# Patient Record
Sex: Female | Born: 1935 | Race: White | Hispanic: No | Marital: Single | Smoking: Never smoker
Health system: Southern US, Community
[De-identification: ages and names within clinical notes are randomized; demographics above are authoritative.]

## PROBLEM LIST (undated history)

## (undated) DIAGNOSIS — I10 Essential (primary) hypertension: Secondary | ICD-10-CM

## (undated) DIAGNOSIS — R413 Other amnesia: Secondary | ICD-10-CM

## (undated) DIAGNOSIS — E785 Hyperlipidemia, unspecified: Secondary | ICD-10-CM

## (undated) DIAGNOSIS — K219 Gastro-esophageal reflux disease without esophagitis: Secondary | ICD-10-CM

## (undated) DIAGNOSIS — M810 Age-related osteoporosis without current pathological fracture: Secondary | ICD-10-CM

## (undated) HISTORY — PX: ABDOMINAL HYSTERECTOMY: SHX81

## (undated) HISTORY — DX: Age-related osteoporosis without current pathological fracture: M81.0

## (undated) HISTORY — DX: Other amnesia: R41.3

## (undated) HISTORY — DX: Essential (primary) hypertension: I10

## (undated) HISTORY — DX: Gastro-esophageal reflux disease without esophagitis: K21.9

## (undated) HISTORY — DX: Hyperlipidemia, unspecified: E78.5

## (undated) HISTORY — PX: EYE SURGERY: SHX253

---

## 1998-11-28 ENCOUNTER — Ambulatory Visit (HOSPITAL_COMMUNITY): Admission: RE | Admit: 1998-11-28 | Discharge: 1998-11-28 | Payer: Self-pay | Admitting: Gastroenterology

## 1999-01-19 ENCOUNTER — Ambulatory Visit (HOSPITAL_COMMUNITY): Admission: RE | Admit: 1999-01-19 | Discharge: 1999-01-19 | Payer: Self-pay | Admitting: Gastroenterology

## 1999-03-23 ENCOUNTER — Ambulatory Visit (HOSPITAL_COMMUNITY): Admission: RE | Admit: 1999-03-23 | Discharge: 1999-03-23 | Payer: Self-pay | Admitting: Oncology

## 1999-03-23 ENCOUNTER — Encounter: Payer: Self-pay | Admitting: Oncology

## 1999-06-01 ENCOUNTER — Ambulatory Visit (HOSPITAL_COMMUNITY): Admission: RE | Admit: 1999-06-01 | Discharge: 1999-06-01 | Payer: Self-pay | Admitting: Gastroenterology

## 1999-10-19 ENCOUNTER — Ambulatory Visit (HOSPITAL_COMMUNITY): Admission: RE | Admit: 1999-10-19 | Discharge: 1999-10-19 | Payer: Self-pay | Admitting: Internal Medicine

## 2000-12-25 ENCOUNTER — Other Ambulatory Visit: Admission: RE | Admit: 2000-12-25 | Discharge: 2000-12-25 | Payer: Self-pay | Admitting: Internal Medicine

## 2002-01-23 ENCOUNTER — Encounter: Payer: Self-pay | Admitting: Orthopedic Surgery

## 2002-01-29 ENCOUNTER — Inpatient Hospital Stay (HOSPITAL_COMMUNITY): Admission: RE | Admit: 2002-01-29 | Discharge: 2002-01-30 | Payer: Self-pay | Admitting: Orthopedic Surgery

## 2002-04-09 ENCOUNTER — Encounter: Admission: RE | Admit: 2002-04-09 | Discharge: 2002-05-27 | Payer: Self-pay | Admitting: Orthopedic Surgery

## 2011-05-24 ENCOUNTER — Encounter: Payer: Self-pay | Admitting: Internal Medicine

## 2011-05-24 NOTE — Telephone Encounter (Signed)
Error

## 2011-08-24 ENCOUNTER — Encounter: Payer: Self-pay | Admitting: Internal Medicine

## 2011-09-21 ENCOUNTER — Ambulatory Visit: Payer: Self-pay | Admitting: Internal Medicine

## 2013-11-11 ENCOUNTER — Ambulatory Visit (INDEPENDENT_AMBULATORY_CARE_PROVIDER_SITE_OTHER): Payer: Commercial Managed Care - HMO

## 2013-11-11 ENCOUNTER — Ambulatory Visit (INDEPENDENT_AMBULATORY_CARE_PROVIDER_SITE_OTHER): Payer: Commercial Managed Care - HMO | Admitting: Family Medicine

## 2013-11-11 ENCOUNTER — Encounter: Payer: Self-pay | Admitting: Family Medicine

## 2013-11-11 VITALS — BP 161/89 | HR 55 | Temp 97.6°F | Ht 62.5 in | Wt 164.0 lb

## 2013-11-11 DIAGNOSIS — M949 Disorder of cartilage, unspecified: Secondary | ICD-10-CM

## 2013-11-11 DIAGNOSIS — K219 Gastro-esophageal reflux disease without esophagitis: Secondary | ICD-10-CM | POA: Insufficient documentation

## 2013-11-11 DIAGNOSIS — R413 Other amnesia: Secondary | ICD-10-CM

## 2013-11-11 DIAGNOSIS — M8580 Other specified disorders of bone density and structure, unspecified site: Secondary | ICD-10-CM

## 2013-11-11 DIAGNOSIS — M899 Disorder of bone, unspecified: Secondary | ICD-10-CM

## 2013-11-11 DIAGNOSIS — Z23 Encounter for immunization: Secondary | ICD-10-CM

## 2013-11-11 DIAGNOSIS — Z780 Asymptomatic menopausal state: Secondary | ICD-10-CM

## 2013-11-11 DIAGNOSIS — I10 Essential (primary) hypertension: Secondary | ICD-10-CM

## 2013-11-11 DIAGNOSIS — E785 Hyperlipidemia, unspecified: Secondary | ICD-10-CM | POA: Insufficient documentation

## 2013-11-11 LAB — POCT CBC
Granulocyte percent: 52.3 %G (ref 37–80)
HCT, POC: 45 % (ref 37.7–47.9)
Hemoglobin: 14.4 g/dL (ref 12.2–16.2)
Lymph, poc: 2.7 (ref 0.6–3.4)
MCH, POC: 30.6 pg (ref 27–31.2)
MCHC: 32.1 g/dL (ref 31.8–35.4)
MCV: 95.5 fL (ref 80–97)
MPV: 7.6 fL (ref 0–99.8)
POC Granulocyte: 3.4 (ref 2–6.9)
POC LYMPH PERCENT: 42 %L (ref 10–50)
Platelet Count, POC: 287 10*3/uL (ref 142–424)
RBC: 4.7 M/uL (ref 4.04–5.48)
RDW, POC: 13.8 %
WBC: 6.5 10*3/uL (ref 4.6–10.2)

## 2013-11-11 NOTE — Patient Instructions (Addendum)
Continue current medications. Continue good therapeutic lifestyle changes which include good diet and exercise. Fall precautions discussed with patient. If an FOBT was given today- please return it to our front desk. If you are over 78 years old - you may need Prevnar 13 or the adult Pneumonia vaccine.  We will increase her Aricept to 10 mg The facility will call back in 4 weeks and we will start Namenda and increase this gradually over several weeks We will see her back in about 5 months We will send a copy of the lab results to the facility where she is staying she will get a chest x-ray today and we will call in with the results of that to the facility and she will return an FOBT. She will also receive a Prevnar vaccine today  please see and facility blood pressure readings for review in 4 weeks. Check blood pressures at least once weekly

## 2013-11-11 NOTE — Progress Notes (Signed)
Subjective:    Patient ID: Vickie Hall, female    DOB: [birth year], 80 y.o.   MRN: [id number]  HPI Pt here for follow up and management of chronic medical problems. The patient is currently a resident at [facility]. Her biggest problem is her memory deficit. She isn't aware that one of the care providers from this facility. She is past due on many things in her health maintenance and these will be arranged through the assisted living facility. She will be given a Prevnar vaccine today. She is pleasant and seemed to have a good grip on her memory today.         There are no active problems to display for this patient.  Outpatient Encounter Prescriptions as of 11/11/2013  Medication Sig  . donepezil (ARICEPT) 5 MG tablet Take 5 mg by mouth at bedtime.  Marland Kitchen ezetimibe (ZETIA) 10 MG tablet Take 10 mg by mouth daily.  . valsartan (DIOVAN) 160 MG tablet Take 160 mg by mouth daily.    Review of Systems  Constitutional: Negative.   HENT: Negative.   Eyes: Negative.   Respiratory: Negative.   Cardiovascular: Negative.   Gastrointestinal: Negative.   Endocrine: Negative.   Genitourinary: Negative.   Musculoskeletal: Negative.   Skin: Negative.   Allergic/Immunologic: Negative.   Neurological: Negative.   Hematological: Negative.   Psychiatric/Behavioral: Negative.        Objective:   Physical Exam  Nursing note and vitals reviewed. Constitutional: She is oriented to person, place, and time. She appears well-developed and well-nourished. No distress.  Pleasant and cooperative  HENT:  Head: Normocephalic and atraumatic.  Right Ear: External ear normal.  Left Ear: External ear normal.  Nose: Nose normal.  Mouth/Throat: Oropharynx is clear and moist.  Eyes: Conjunctivae and EOM are normal. Pupils are equal, round, and reactive to light. Right eye exhibits no discharge. Left eye exhibits no discharge. No scleral icterus.  Neck: Normal range of motion. Neck  supple. No thyromegaly present.  Cardiovascular: Normal rate, regular rhythm, normal heart sounds and intact distal pulses.  Exam reveals no gallop and no friction rub.   No murmur heard. Regular rate and rhythm at 72 per minute  Pulmonary/Chest: Effort normal and breath sounds normal. No respiratory distress. She has no wheezes. She has no rales. She exhibits no tenderness.  Abdominal: Soft. Bowel sounds are normal. She exhibits no mass. There is no tenderness. There is no rebound and no guarding.  Genitourinary:  Breast exam was done today and this was within normal limits without any axillary adenopathy or breast masses  Musculoskeletal: Normal range of motion. She exhibits no edema and no tenderness.  Lymphadenopathy:    She has no cervical adenopathy.  Neurological: She is alert and oriented to person, place, and time. She has normal reflexes. No cranial nerve deficit.  Skin: Skin is warm and dry. No rash noted.  Psychiatric: She has a normal mood and affect. Her behavior is normal.   BP 161/89  Pulse 55  Temp(Src) 97.6 F (36.4 C) (Oral)  Ht 5' 2.5" (1.588 m)  Wt 164 lb (74.39 kg)  BMI 29.50 kg/m2  [facility] reading (PRIMARY) by  Dr. Brunilda Payor x-ray -within normal limit, no acute cardiopulmonary changes                                        Assessment & Plan:  1. GERD (gastroesophageal reflux disease) - POCT CBC  2. HTN (hypertension) - POCT CBC - BMP8+EGFR - Hepatic function panel - DG Chest 2 View; Future  3. Hyperlipidemia - POCT CBC - NMR, lipoprofile  4. Memory deficit -Will increase Aricept today - POCT CBC  5. Osteopenia - POCT CBC - Vit D  25 hydroxy (rtn osteoporosis monitoring) - DG Bone Density; Future  6. Postmenopausal - DG Bone Density; Future   Patient Instructions  Continue current medications. Continue good therapeutic lifestyle changes which include good diet and exercise. Fall precautions discussed with patient. If an FOBT was given  today- please return it to our front desk. If you are over 50 years old - you may need Prevnar 13 or the adult Pneumonia vaccine.  We will increase her Aricept to 10 mg The facility will call back in 4 weeks and we will start Namenda and increase this gradually over several weeks We will see her back in about 5 months We will send a copy of the lab results to the facility where she is staying she will get a chest x-ray today and we will call in with the results of that to the facility and she will return an FOBT. She will also receive a Prevnar vaccine today  please see and facility blood pressure readings for review in 4 weeks. Check blood pressures at least once weekly   Don W. Moore MD  

## 2013-11-13 LAB — NMR, LIPOPROFILE
Cholesterol: 207 mg/dL — ABNORMAL HIGH (ref <200)
HDL Cholesterol by NMR: 41 mg/dL (ref >=40)
HDL Particle Number: 31.3 umol/L (ref >=30.5)
LDL PARTICLE NUMBER: 1933 nmol/L — AB (ref <1000)
LDL SIZE: 20.4 nm — AB (ref >20.5)
LDLC SERPL CALC-MCNC: 121 mg/dL — ABNORMAL HIGH (ref <100)
LP-IR SCORE: 60 — AB (ref <=45)
Small LDL Particle Number: 1270 nmol/L — ABNORMAL HIGH (ref <=527)
Triglycerides by NMR: 224 mg/dL — ABNORMAL HIGH (ref <150)

## 2013-11-13 LAB — BMP8+EGFR
BUN/Creatinine Ratio: 19 (ref 11–26)
BUN: 15 mg/dL (ref 8–27)
CO2: 25 mmol/L (ref 18–29)
Calcium: 9.6 mg/dL (ref 8.7–10.3)
Chloride: 101 mmol/L (ref 97–108)
Creatinine, Ser: 0.79 mg/dL (ref 0.57–1.00)
GFR calc Af Amer: 80 mL/min/{1.73_m2} (ref >59)
GFR calc non Af Amer: 69 mL/min/{1.73_m2} (ref >59)
Glucose: 112 mg/dL — ABNORMAL HIGH (ref 65–99)
Potassium: 3.9 mmol/L (ref 3.5–5.2)
Sodium: 141 mmol/L (ref 134–144)

## 2013-11-13 LAB — HEPATIC FUNCTION PANEL
ALT: 19 IU/L (ref 0–32)
AST: 15 IU/L (ref 0–40)
Albumin: 4.7 g/dL (ref 3.5–4.7)
Alkaline Phosphatase: 89 IU/L (ref 39–117)
Bilirubin, Direct: 0.1 mg/dL (ref 0.00–0.40)
Total Bilirubin: 0.3 mg/dL (ref 0.0–1.2)
Total Protein: 7.2 g/dL (ref 6.0–8.5)

## 2013-11-13 LAB — VITAMIN D 25 HYDROXY (VIT D DEFICIENCY, FRACTURES): Vit D, 25-Hydroxy: 21.3 ng/mL — ABNORMAL LOW (ref 30.0–100.0)

## 2013-11-16 NOTE — Addendum Note (Signed)
Addended by: Magdalene RiverBULLINS, Ladesha Pacini H on: 11/16/2013 11:33 AM   Modules accepted: Orders

## 2013-11-30 ENCOUNTER — Encounter: Payer: Self-pay | Admitting: *Deleted

## 2013-12-02 ENCOUNTER — Telehealth: Payer: Self-pay | Admitting: Pharmacist

## 2013-12-02 NOTE — Telephone Encounter (Signed)
Please let patient know OK to discuss both conditions at appt April 1st

## 2013-12-14 ENCOUNTER — Ambulatory Visit: Payer: Commercial Managed Care - HMO

## 2013-12-16 ENCOUNTER — Ambulatory Visit (INDEPENDENT_AMBULATORY_CARE_PROVIDER_SITE_OTHER): Payer: Commercial Managed Care - HMO

## 2013-12-16 ENCOUNTER — Encounter: Payer: Self-pay | Admitting: Pharmacist

## 2013-12-16 ENCOUNTER — Ambulatory Visit (INDEPENDENT_AMBULATORY_CARE_PROVIDER_SITE_OTHER): Payer: Commercial Managed Care - HMO | Admitting: Pharmacist

## 2013-12-16 VITALS — BP 155/80 | HR 60 | Ht 62.0 in | Wt 163.5 lb

## 2013-12-16 DIAGNOSIS — Z780 Asymptomatic menopausal state: Secondary | ICD-10-CM

## 2013-12-16 DIAGNOSIS — R413 Other amnesia: Secondary | ICD-10-CM

## 2013-12-16 DIAGNOSIS — M949 Disorder of cartilage, unspecified: Secondary | ICD-10-CM

## 2013-12-16 DIAGNOSIS — M8580 Other specified disorders of bone density and structure, unspecified site: Secondary | ICD-10-CM

## 2013-12-16 DIAGNOSIS — M899 Disorder of bone, unspecified: Secondary | ICD-10-CM

## 2013-12-16 DIAGNOSIS — M810 Age-related osteoporosis without current pathological fracture: Secondary | ICD-10-CM

## 2013-12-16 MED ORDER — MEMANTINE HCL ER 28 MG PO CP24: 1.0000 | ORAL_CAPSULE | Freq: Every day | ORAL | Status: DC

## 2013-12-16 MED ORDER — MEMANTINE HCL ER 7 & 14 & 21 &28 MG PO CP24: ORAL_CAPSULE | ORAL | Status: DC

## 2013-12-16 NOTE — Progress Notes (Signed)
Patient ID: Lamont SnowballHelen M Eugene, female   DOB: 10/26/1927, 80 y.o.   MRN: [id number] [facility] Current Height: Height: 5\' 2"  (157.5 cm)      Max Lifetime Height:  5\' 3"  Current Weight: Weight: 163 lb 8 oz (74.163 kg)       Ethnicity:Caucasian  BP: BP: 155/80 mmHg     HR:  Pulse Rate: 60      HPI: Does pt already have a diagnosis of:  Osteopenia?  Yes Osteoporosis?  No  Back Pain?  No       Kyphosis?  No Prior fracture?  No Med(s) for Osteoporosis/Osteopenia:  none Med(s) previously tried for Osteoporosis/Osteopenia:  evista - just didn't continue, fosamax - non compilant, Actonel                                                             PMH: Age at menopause:  80 yo Hysterectomy?  Yes Oophorectomy?  UTD HRT? Yes - Former.  Type/duration: less than 6 months Steroid Use?  No Thyroid med?  No History of cancer?  No History of digestive disorders (ie Crohn's)?  No Current or previous eating disorders?  No Last Vitamin D Result:  21.3 (11/11/2013) Last GFR Result:  69 (11/11/2013)   FH/SH: Family history of osteoporosis?  No Parent with history of hip fracture?  No Family history of breast cancer?  No Exercise?  Yes - but very little Smoking?  No Alcohol?  No    Calcium Assessment Calcium Intake  # of servings/day  Calcium mg  Milk (8 oz) 1  x  300  = 300mg   Yogurt (4 oz) 0 x  200 = 0  Cheese (1 oz) 1 x  200 = 200mg   Other Calcium sources   250mg   Ca supplement 0 = 0   Estimated calcium intake per day 750mg     DEXA Results Date of Test T-Score for AP Spine L1-L4 T-Score for Total Left Hip T-Score for Total Right Hip  12/16/2013 -0.2 -2.2 -2.3  06/20/2011 -0.3 -1.8 -1.5  10/20/2007 0.4 -1.5 -1.2  ** T-Score for neck of left hip 12/16/2013 -2.5     Assessment: Osteoporosis with continued decreased BMD Memory deficit    Recommendations: 1.  Start  alendronate (FOSAMAX)  2.  recommend calcium 1200mg  daily through supplementation or diet.  3.  recommend  weight bearing exercise - 30 minutes at least 4 days per week.   4.  Counseled and educated about fall risk and prevention. 5.  Pre Dr Kathi DerMoore's last note - started Namenda XR 7mg  for 7 days, then 14mg  for 7 days, 21 mg for 7 days then 28mg  daily thereafter.  Recheck DEXA:  2 years  Time spent counseling patient:  30 minutes   Henrene Pastorammy Kenroy Timberman, PharmD, CPP

## 2014-03-01 ENCOUNTER — Encounter: Payer: Self-pay | Admitting: Family Medicine

## 2014-03-01 ENCOUNTER — Ambulatory Visit (INDEPENDENT_AMBULATORY_CARE_PROVIDER_SITE_OTHER): Payer: Commercial Managed Care - HMO | Admitting: Family Medicine

## 2014-03-01 VITALS — BP 162/78 | HR 54 | Temp 97.6°F | Ht 62.0 in | Wt 161.0 lb

## 2014-03-01 DIAGNOSIS — R413 Other amnesia: Secondary | ICD-10-CM

## 2014-03-01 DIAGNOSIS — E559 Vitamin D deficiency, unspecified: Secondary | ICD-10-CM

## 2014-03-01 DIAGNOSIS — H6121 Impacted cerumen, right ear: Secondary | ICD-10-CM

## 2014-03-01 DIAGNOSIS — H6120 Impacted cerumen, unspecified ear: Secondary | ICD-10-CM

## 2014-03-01 DIAGNOSIS — E785 Hyperlipidemia, unspecified: Secondary | ICD-10-CM

## 2014-03-01 DIAGNOSIS — I10 Essential (primary) hypertension: Secondary | ICD-10-CM

## 2014-03-01 DIAGNOSIS — K219 Gastro-esophageal reflux disease without esophagitis: Secondary | ICD-10-CM

## 2014-03-01 LAB — POCT CBC
GRANULOCYTE PERCENT: 54 % (ref 37–80)
HEMATOCRIT: 45.7 % (ref 37.7–47.9)
HEMOGLOBIN: 14.1 g/dL (ref 12.2–16.2)
Lymph, poc: 2.9 (ref 0.6–3.4)
MCH, POC: 29.5 pg (ref 27–31.2)
MCHC: 30.9 g/dL — AB (ref 31.8–35.4)
MCV: 95.4 fL (ref 80–97)
MPV: 7.9 fL (ref 0–99.8)
POC GRANULOCYTE: 3.9 (ref 2–6.9)
POC LYMPH PERCENT: 39.6 %L (ref 10–50)
Platelet Count, POC: 256 10*3/uL (ref 142–424)
RBC: 4.8 M/uL (ref 4.04–5.48)
RDW, POC: 14 %
WBC: 7.3 10*3/uL (ref 4.6–10.2)

## 2014-03-01 NOTE — Patient Instructions (Addendum)
Medicare Annual Wellness Visit  [employer] and the medical providers at [facility] strive to bring you the best medical care.  In doing so we not only want to address your current medical conditions and concerns but also to detect new conditions early and prevent illness, disease and health-related problems.    Medicare offers a yearly Wellness Visit which allows our clinical staff to assess your need for preventative services including immunizations, lifestyle education, counseling to decrease risk of preventable diseases and screening for fall risk and other medical concerns.    This visit is provided free of charge (no copay) for all Medicare recipients. The clinical pharmacists at [facility] have begun to conduct these Wellness Visits which will also include a thorough review of all your medications.    As you primary medical provider recommend that you make an appointment for your Annual Wellness Visit if you have not done so already this year.  You may set up this appointment before you leave today or you may call back [phone] and schedule an appointment.  Please make sure when you call that you mention that you are scheduling your Annual Wellness Visit with the clinical pharmacist so that the appointment may be made for the proper length of time.      Continue current medications. Continue good therapeutic lifestyle changes which include good diet and exercise. Fall precautions discussed with patient. If an FOBT was given today- please return it to our front desk. If you are over 78 years old - you may need Prevnar 13 or the adult Pneumonia vaccine.  You may use Debrox eardrops 2-3 drops nightly to the right ear canal for 3 nights and and wait 1 week and repeat, this will help soften the ear cerumen in the ear canal Return blood pressures for review in 2 weeks

## 2014-03-01 NOTE — Progress Notes (Signed)
Subjective:    Patient ID: Vickie Hall, female    DOB: 30-May-1928, 80 y.o.   MRN: [id number]  HPI Pt here for follow up and management of chronic medical problems. The patient comes to the visit today, [facility]. She is to get lab work today issues also due to return in FOBT. The patient as usual has no complaints. She is in bed at [facility].       Patient Active Problem List   Diagnosis Date Noted  . Osteoporosis, senile 12/16/2013  . GERD (gastroesophageal reflux disease) 11/11/2013  . HTN (hypertension) 11/11/2013  . Hyperlipidemia 11/11/2013  . Memory deficit 11/11/2013   Outpatient Encounter Prescriptions as of 03/01/2014  Medication Sig  . alendronate (FOSAMAX) 70 MG tablet Take 70 mg by mouth once a week. Take with a full glass of water on an empty stomach.  . donepezil (ARICEPT) 10 MG tablet Take 10 mg by mouth at bedtime.  Marland Kitchen ezetimibe (ZETIA) 10 MG tablet Take 10 mg by mouth daily.  . Memantine HCl ER (NAMENDA XR TITRATION PACK) 7 & 14 & 21 &28 MG CP24 Take per package directiond  . valsartan (DIOVAN) 160 MG tablet Take 160 mg by mouth daily.  . [DISCONTINUED] Memantine HCl ER 28 MG CP24 Take 28 mg by mouth daily.    Review of Systems  Constitutional: Negative.   HENT: Negative.   Eyes: Negative.   Respiratory: Negative.   Cardiovascular: Negative.   Gastrointestinal: Negative.   Endocrine: Negative.   Genitourinary: Negative.   Musculoskeletal: Negative.   Skin: Negative.   Allergic/Immunologic: Negative.   Neurological: Negative.   Hematological: Negative.   Psychiatric/Behavioral: Negative.        Objective:   Physical Exam  Nursing note and vitals reviewed. Constitutional: She appears well-developed and well-nourished. No distress.  HENT:  Head: Normocephalic and atraumatic.  Left Ear: External ear normal.  Nose: Nose normal.  Mouth/Throat: Oropharynx is clear and moist. No  oropharyngeal exudate.  Ear cerumen  Eyes: Conjunctivae and EOM are normal. Pupils are equal, round, and reactive to light. Right eye exhibits no discharge. Left eye exhibits no discharge. No scleral icterus.  Neck: Normal range of motion. Neck supple. No thyromegaly present.  Cardiovascular: Normal rate, regular rhythm, normal heart sounds and intact distal pulses.  Exam reveals no gallop and no friction rub.   No murmur heard. At 60 per minute  Pulmonary/Chest: Effort normal and breath sounds normal. No respiratory distress. She has no wheezes. She has no rales. She exhibits no tenderness.  Abdominal: Soft. Bowel sounds are normal. She exhibits no mass. There is no tenderness. There is no rebound and no guarding.  Musculoskeletal: Normal range of motion. She exhibits no edema and no tenderness.  Lymphadenopathy:    She has no cervical adenopathy.  Neurological: She is alert. She has normal reflexes. No cranial nerve deficit.  Skin: Skin is warm and dry. No rash noted.  Psychiatric: She has a normal mood and affect. Her behavior is normal. Thought content normal.  Patient's memory is definitely diminished. She did not recall that her brothers had passed away   BP 162/78  Pulse 54  Temp(Src) 97.6 F (36.4 C) (Oral)  Ht '5\' 2"'  (1.575 m)  Wt 161 lb (73.029 kg)  BMI 29.44 kg/m2        Assessment & Plan:  1. GERD (gastroesophageal reflux disease) - POCT CBC  2. HTN (hypertension) -  POCT CBC - BMP8+EGFR - Hepatic function panel  3. Hyperlipidemia - POCT CBC - Lipid panel  4. Vitamin D deficiency - Vit D  25 hydroxy (rtn osteoporosis monitoring)  5. Memory impairment  6. Right ear impacted cerumen Patient Instructions                       Medicare Annual Wellness Visit  [place] and the medical providers at [facility] strive to bring you the best medical care.  In doing so we not only want to address your current medical conditions and concerns  but also to detect new conditions early and prevent illness, disease and health-related problems.    Medicare offers a yearly Wellness Visit which allows our clinical staff to assess your need for preventative services including immunizations, lifestyle education, counseling to decrease risk of preventable diseases and screening for fall risk and other medical concerns.    This visit is provided free of charge (no copay) for all Medicare recipients. The clinical pharmacists at [facility] have begun to conduct these Wellness Visits which will also include a thorough review of all your medications.    As you primary medical provider recommend that you make an appointment for your Annual Wellness Visit if you have not done so already this year.  You may set up this appointment before you leave today or you may call back [phone]) and schedule an appointment.  Please make sure when you call that you mention that you are scheduling your Annual Wellness Visit with the clinical pharmacist so that the appointment may be made for the proper length of time.      Continue current medications. Continue good therapeutic lifestyle changes which include good diet and exercise. Fall precautions discussed with patient. If an FOBT was given today- please return it to our front desk. If you are over 68 years old - you may need Prevnar 59 or the adult Pneumonia vaccine.  You may use Debrox eardrops 2-3 drops nightly to the right ear canal for 3 nights and and wait 1 week and repeat, this will help soften the ear cerumen in the ear canal Return blood pressures for review in 2 weeks    Arrie Senate MD

## 2014-03-02 LAB — BMP8+EGFR
BUN/Creatinine Ratio: 15 (ref 11–26)
BUN: 13 mg/dL (ref 8–27)
CO2: 24 mmol/L (ref 18–29)
Calcium: 9.4 mg/dL (ref 8.7–10.3)
Chloride: 99 mmol/L (ref 97–108)
Creatinine, Ser: 0.84 mg/dL (ref 0.57–1.00)
GFR, EST AFRICAN AMERICAN: 74 mL/min/{1.73_m2} (ref >59)
GFR, EST NON AFRICAN AMERICAN: 64 mL/min/{1.73_m2} (ref >59)
Glucose: 120 mg/dL — ABNORMAL HIGH (ref 65–99)
Potassium: 4.9 mmol/L (ref 3.5–5.2)
Sodium: 140 mmol/L (ref 134–144)

## 2014-03-02 LAB — HEPATIC FUNCTION PANEL
ALBUMIN: 4.5 g/dL (ref 3.5–4.7)
ALK PHOS: 87 IU/L (ref 39–117)
ALT: 16 IU/L (ref 0–32)
AST: 23 IU/L (ref 0–40)
BILIRUBIN DIRECT: 0.1 mg/dL (ref 0.00–0.40)
Total Bilirubin: 0.3 mg/dL (ref 0.0–1.2)
Total Protein: 7.1 g/dL (ref 6.0–8.5)

## 2014-03-02 LAB — LIPID PANEL
Chol/HDL Ratio: 4.7 ratio units — ABNORMAL HIGH (ref 0.0–4.4)
Cholesterol, Total: 223 mg/dL — ABNORMAL HIGH (ref 100–199)
HDL: 47 mg/dL (ref >39)
LDL CALC: 143 mg/dL — AB (ref 0–99)
TRIGLYCERIDES: 167 mg/dL — AB (ref 0–149)
VLDL Cholesterol Cal: 33 mg/dL (ref 5–40)

## 2014-03-02 LAB — VITAMIN D 25 HYDROXY (VIT D DEFICIENCY, FRACTURES): Vit D, 25-Hydroxy: 17.7 ng/mL — ABNORMAL LOW (ref 30.0–100.0)

## 2014-03-08 ENCOUNTER — Ambulatory Visit: Payer: Commercial Managed Care - HMO | Admitting: Family Medicine

## 2014-07-07 ENCOUNTER — Encounter: Payer: Self-pay | Admitting: Family Medicine

## 2014-07-07 ENCOUNTER — Ambulatory Visit (INDEPENDENT_AMBULATORY_CARE_PROVIDER_SITE_OTHER): Payer: Commercial Managed Care - HMO | Admitting: Family Medicine

## 2014-07-07 VITALS — BP 154/83 | HR 50 | Temp 97.0°F | Ht 62.0 in | Wt 155.0 lb

## 2014-07-07 DIAGNOSIS — F0390 Unspecified dementia without behavioral disturbance: Secondary | ICD-10-CM

## 2014-07-07 DIAGNOSIS — Z23 Encounter for immunization: Secondary | ICD-10-CM

## 2014-07-07 DIAGNOSIS — I10 Essential (primary) hypertension: Secondary | ICD-10-CM

## 2014-07-07 DIAGNOSIS — K219 Gastro-esophageal reflux disease without esophagitis: Secondary | ICD-10-CM

## 2014-07-07 DIAGNOSIS — E559 Vitamin D deficiency, unspecified: Secondary | ICD-10-CM

## 2014-07-07 DIAGNOSIS — R413 Other amnesia: Secondary | ICD-10-CM

## 2014-07-07 DIAGNOSIS — E785 Hyperlipidemia, unspecified: Secondary | ICD-10-CM

## 2014-07-07 LAB — POCT CBC
Granulocyte percent: 57.9 %G (ref 37–80)
HCT, POC: 44.7 % (ref 37.7–47.9)
Hemoglobin: 14.4 g/dL (ref 12.2–16.2)
LYMPH, POC: 2.8 (ref 0.6–3.4)
MCH: 31 pg (ref 27–31.2)
MCHC: 32.2 g/dL (ref 31.8–35.4)
MCV: 96.1 fL (ref 80–97)
MPV: 7.7 fL (ref 0–99.8)
PLATELET COUNT, POC: 245 10*3/uL (ref 142–424)
POC Granulocyte: 4.5 (ref 2–6.9)
POC LYMPH PERCENT: 37 %L (ref 10–50)
RBC: 4.7 M/uL (ref 4.04–5.48)
RDW, POC: 13.6 %
WBC: 7.7 10*3/uL (ref 4.6–10.2)

## 2014-07-07 MED ORDER — MEMANTINE HCL-DONEPEZIL HCL ER 28-10 MG PO CP24: 1.0000 | ORAL_CAPSULE | Freq: Every day | ORAL | Status: DC

## 2014-07-07 NOTE — Progress Notes (Signed)
Subjective:    Patient ID: Vickie Hall, female    DOB: 03-Jan-1928, 80 y.o.   MRN: [id number]  HPI Pt here for follow up and management of chronic medical problems. The patient tends to the visit today from [facility]. She comes with one of the caregivers there. Patient usual her main problem is her memory impairment and as usual she has no complaints. The patient has been on Namenda and Aricept and we will change her to namzaric 10/28 one daily. The patient is able to carry on conversation and still wants to go from. Her memory is definitely impaired regarding her family and her ability to be able to take care of herself.       Patient Active Problem List   Diagnosis Date Noted  . Osteoporosis, senile 12/16/2013  . GERD (gastroesophageal reflux disease) 11/11/2013  . HTN (hypertension) 11/11/2013  . Hyperlipidemia 11/11/2013  . Memory deficit 11/11/2013   Outpatient Encounter Prescriptions as of 07/07/2014  Medication Sig  . alendronate (FOSAMAX) 70 MG tablet Take 70 mg by mouth once a week. Take with a full glass of water on an empty stomach.  . donepezil (ARICEPT) 10 MG tablet Take 10 mg by mouth at bedtime.  Marland Kitchen ezetimibe (ZETIA) 10 MG tablet Take 10 mg by mouth daily.  . Memantine HCl ER (NAMENDA XR TITRATION PACK) 7 & 14 & 21 &28 MG CP24 Take per package directiond  . valsartan (DIOVAN) 160 MG tablet Take 160 mg by mouth daily.    Review of Systems  Constitutional: Negative.   HENT: Negative.   Eyes: Negative.   Respiratory: Negative.   Cardiovascular: Negative.   Gastrointestinal: Negative.   Endocrine: Negative.   Genitourinary: Negative.   Musculoskeletal: Negative.   Skin: Negative.   Allergic/Immunologic: Negative.   Neurological: Negative.   Hematological: Negative.   Psychiatric/Behavioral: Negative.        Objective:   Physical Exam  Nursing note and vitals reviewed. Constitutional: She is oriented to person, place, and time. She  appears well-developed and well-nourished. No distress.  HENT:  Head: Normocephalic and atraumatic.  Left Ear: External ear normal.  Mouth/Throat: Oropharynx is clear and moist.  There is impacted ear cerumen in the right ear canal . There is nasal congestion bilateral  Eyes: Conjunctivae and EOM are normal. Pupils are equal, round, and reactive to light. Right eye exhibits no discharge. Left eye exhibits no discharge. No scleral icterus.  Neck: Normal range of motion. Neck supple. No thyromegaly present.  No carotid bruits were auscultated  Cardiovascular: Normal rate, regular rhythm, normal heart sounds and intact distal pulses.   No murmur heard. At 60 per minute  Pulmonary/Chest: Effort normal and breath sounds normal. No respiratory distress. She has no wheezes. She has no rales. She exhibits no tenderness.  Abdominal: Soft. Bowel sounds are normal. She exhibits no mass. There is no tenderness. There is no rebound and no guarding.  Musculoskeletal: Normal range of motion. She exhibits no edema and no tenderness.  Lymphadenopathy:    She has no cervical adenopathy.  Neurological: She is alert and oriented to person, place, and time. She has normal reflexes. No cranial nerve deficit.  Skin: Skin is warm and dry. No rash noted.  Psychiatric: She has a normal mood and affect. Her behavior is normal.  The patient thinks that she is still able to go home and stay by herself. She is able to respond appropriately to questions but the ability to  remember specific details or gone. She is dressed appropriately and seems to be pleasant and calm and a difficult situation and time in her life.   BP 154/83  Pulse 50  Temp(Src) 97 F (36.1 C) (Oral)  Ht _0  (1.575 m)  Wt 155 lb (70.308 kg)  BMI 28.34 kg/m2  Right ear canal will be irrigated to remove cerumen from the ear canal.      Assessment & Plan:  1. Gastroesophageal reflux disease, esophagitis presence not specified - POCT CBC  2.  Essential hypertension - POCT CBC - BMP8+EGFR - Hepatic function panel  3. Hyperlipidemia - POCT CBC - Lipid panel  4. Memory deficit - POCT CBC  5. Vitamin D deficiency - Vit D  25 hydroxy (rtn osteoporosis monitoring)  6. Dementia, without behavioral disturbance  Meds ordered this encounter  Medications  . Memantine HCl-Donepezil HCl (NAMZARIC) 28-10 MG CP24    Sig: Take 1 tablet by mouth at bedtime.    Dispense:  30 capsule    Refill:  5   Patient Instructions                       Medicare Annual Wellness Visit  [place] and the medical providers at [facility] strive to bring you the best medical care.  In doing so we not only want to address your current medical conditions and concerns but also to detect new conditions early and prevent illness, disease and health-related problems.    Medicare offers a yearly Wellness Visit which allows our clinical staff to assess your need for preventative services including immunizations, lifestyle education, counseling to decrease risk of preventable diseases and screening for fall risk and other medical concerns.    This visit is provided free of charge (no copay) for all Medicare recipients. The clinical pharmacists at [facility] have begun to conduct these Wellness Visits which will also include a thorough review of all your medications.    As you primary medical provider recommend that you make an appointment for your Annual Wellness Visit if you have not done so already this year.  You may set up this appointment before you leave today or you may call back [phone]) and schedule an appointment.  Please make sure when you call that you mention that you are scheduling your Annual Wellness Visit with the clinical pharmacist so that the appointment may be made for the proper length of time.     Continue current medications. Continue good therapeutic lifestyle changes which include good  diet and exercise. Fall precautions discussed with patient. If an FOBT was given today- please return it to our front desk. If you are over 50 years old - you may need Prevnar 5 or the adult Pneumonia vaccine.  Flu Shots will be available at our office starting mid- September. Please call and schedule a FLU CLINIC APPOINTMENT. The patient will receive her flu vaccine today. She will also receive lab work and be given an FOBT to return.   Arrie Senate MD

## 2014-07-07 NOTE — Patient Instructions (Addendum)
Medicare Annual Wellness Visit  [place] and the medical providers at [facility] strive to bring you the best medical care.  In doing so we not only want to address your current medical conditions and concerns but also to detect new conditions early and prevent illness, disease and health-related problems.    Medicare offers a yearly Wellness Visit which allows our clinical staff to assess your need for preventative services including immunizations, lifestyle education, counseling to decrease risk of preventable diseases and screening for fall risk and other medical concerns.    This visit is provided free of charge (no copay) for all Medicare recipients. The clinical pharmacists at [facility] have begun to conduct these Wellness Visits which will also include a thorough review of all your medications.    As you primary medical provider recommend that you make an appointment for your Annual Wellness Visit if you have not done so already this year.  You may set up this appointment before you leave today or you may call back [phone] and schedule an appointment.  Please make sure when you call that you mention that you are scheduling your Annual Wellness Visit with the clinical pharmacist so that the appointment may be made for the proper length of time.     Continue current medications. Continue good therapeutic lifestyle changes which include good diet and exercise. Fall precautions discussed with patient. If an FOBT was given today- please return it to our front desk. If you are over 78 years old - you may need Prevnar 13 or the adult Pneumonia vaccine.  Flu Shots will be available at our office starting mid- September. Please call and schedule a FLU CLINIC APPOINTMENT. The patient will receive her flu vaccine today. She will also receive lab work and be given an FOBT to return.

## 2014-07-08 ENCOUNTER — Other Ambulatory Visit: Payer: Self-pay | Admitting: *Deleted

## 2014-07-08 LAB — LIPID PANEL
Chol/HDL Ratio: 4.4 ratio units (ref 0.0–4.4)
Cholesterol, Total: 209 mg/dL — ABNORMAL HIGH (ref 100–199)
HDL: 48 mg/dL (ref >39)
LDL Calculated: 133 mg/dL — ABNORMAL HIGH (ref 0–99)
Triglycerides: 138 mg/dL (ref 0–149)
VLDL Cholesterol Cal: 28 mg/dL (ref 5–40)

## 2014-07-08 LAB — VITAMIN D 25 HYDROXY (VIT D DEFICIENCY, FRACTURES): VIT D 25 HYDROXY: 21 ng/mL — AB (ref 30.0–100.0)

## 2014-07-08 LAB — HEPATIC FUNCTION PANEL
ALT: 14 IU/L (ref 0–32)
AST: 17 IU/L (ref 0–40)
Albumin: 4.5 g/dL (ref 3.5–4.7)
Alkaline Phosphatase: 71 IU/L (ref 39–117)
Bilirubin, Direct: 0.11 mg/dL (ref 0.00–0.40)
TOTAL PROTEIN: 6.8 g/dL (ref 6.0–8.5)
Total Bilirubin: 0.4 mg/dL (ref 0.0–1.2)

## 2014-07-08 LAB — BMP8+EGFR
BUN/Creatinine Ratio: 24 (ref 11–26)
BUN: 18 mg/dL (ref 8–27)
CHLORIDE: 100 mmol/L (ref 97–108)
CO2: 26 mmol/L (ref 18–29)
Calcium: 9.3 mg/dL (ref 8.7–10.3)
Creatinine, Ser: 0.75 mg/dL (ref 0.57–1.00)
GFR calc Af Amer: 85 mL/min/{1.73_m2} (ref >59)
GFR, EST NON AFRICAN AMERICAN: 73 mL/min/{1.73_m2} (ref >59)
GLUCOSE: 111 mg/dL — AB (ref 65–99)
POTASSIUM: 4.8 mmol/L (ref 3.5–5.2)
Sodium: 141 mmol/L (ref 134–144)

## 2014-07-08 MED ORDER — VITAMIN D (ERGOCALCIFEROL) 1.25 MG (50000 UNIT) PO CAPS: 50000.0000 [IU] | ORAL_CAPSULE | ORAL | Status: DC

## 2014-07-08 NOTE — Addendum Note (Signed)
Addended by: Magdalene RiverBULLINS, Wylie Coon H on: 07/08/2014 02:14 PM   Modules accepted: Orders

## 2014-07-12 ENCOUNTER — Encounter: Payer: Self-pay | Admitting: *Deleted

## 2014-11-08 ENCOUNTER — Encounter: Payer: Self-pay | Admitting: Family Medicine

## 2014-11-08 ENCOUNTER — Ambulatory Visit (INDEPENDENT_AMBULATORY_CARE_PROVIDER_SITE_OTHER): Payer: Commercial Managed Care - HMO | Admitting: Family Medicine

## 2014-11-08 VITALS — BP 137/81 | HR 67 | Temp 97.8°F | Ht 62.0 in | Wt 151.0 lb

## 2014-11-08 DIAGNOSIS — E785 Hyperlipidemia, unspecified: Secondary | ICD-10-CM | POA: Diagnosis not present

## 2014-11-08 DIAGNOSIS — I10 Essential (primary) hypertension: Secondary | ICD-10-CM

## 2014-11-08 DIAGNOSIS — J209 Acute bronchitis, unspecified: Secondary | ICD-10-CM | POA: Diagnosis not present

## 2014-11-08 DIAGNOSIS — M549 Dorsalgia, unspecified: Secondary | ICD-10-CM | POA: Diagnosis not present

## 2014-11-08 DIAGNOSIS — R413 Other amnesia: Secondary | ICD-10-CM | POA: Diagnosis not present

## 2014-11-08 DIAGNOSIS — E559 Vitamin D deficiency, unspecified: Secondary | ICD-10-CM | POA: Diagnosis not present

## 2014-11-08 DIAGNOSIS — K219 Gastro-esophageal reflux disease without esophagitis: Secondary | ICD-10-CM

## 2014-11-08 LAB — POCT URINALYSIS DIPSTICK
Bilirubin, UA: NEGATIVE
GLUCOSE UA: NEGATIVE
KETONES UA: NEGATIVE
Nitrite, UA: NEGATIVE
Urobilinogen, UA: NEGATIVE
pH, UA: 5

## 2014-11-08 LAB — POCT UA - MICROSCOPIC ONLY
Bacteria, U Microscopic: NEGATIVE
Casts, Ur, LPF, POC: NEGATIVE
Crystals, Ur, HPF, POC: NEGATIVE
Yeast, UA: NEGATIVE

## 2014-11-08 LAB — POCT CBC
Granulocyte percent: 53.2 %G (ref 37–80)
HCT, POC: 46.2 % (ref 37.7–47.9)
HEMOGLOBIN: 14.1 g/dL (ref 12.2–16.2)
LYMPH, POC: 2.3 (ref 0.6–3.4)
MCH, POC: 29.4 pg (ref 27–31.2)
MCHC: 30.6 g/dL — AB (ref 31.8–35.4)
MCV: 96.1 fL (ref 80–97)
MPV: 7.2 fL (ref 0–99.8)
POC Granulocyte: 3.3 (ref 2–6.9)
POC LYMPH PERCENT: 37.2 %L (ref 10–50)
Platelet Count, POC: 217 10*3/uL (ref 142–424)
RBC: 4.8 M/uL (ref 4.04–5.48)
RDW, POC: 13.5 %
WBC: 6.2 10*3/uL (ref 4.6–10.2)

## 2014-11-08 MED ORDER — AZITHROMYCIN 250 MG PO TABS: ORAL_TABLET | ORAL | Status: DC

## 2014-11-08 NOTE — Progress Notes (Signed)
Subjective:    Patient ID: Vickie Hall, female    DOB: 09/04/1928, 81 y.o.   MRN: [id number]  HPI Pt here for follow up and management of chronic medical problems which includes hypertension and hyperlipidemia. She is taking medications regularly. The patient is in the memory unit at [facility]. She comes in today with one of the [facility] directors. As usual she has no complaints. She does complain with her back hurting her. She has a history of vitamin D deficiency hypertension and elevated cholesterol. She's been taking her medicines regularly as these are dispensed by the [facility] nurses. He is due to return an FOBT and will get traditional lab work today. The caregiver from [facility] indicates the patient has just started coughing today and a lot of residents have been having cough and congestion. The patient has some back pain this morning and has no complaints with passing her urine. According to the caregiver from [facility] the patient's memory issues are stable and her behavior is stable with no agitation noted.        Patient Active Problem List   Diagnosis Date Noted  . Osteoporosis, senile 12/16/2013  . GERD (gastroesophageal reflux disease) 11/11/2013  . HTN (hypertension) 11/11/2013  . Hyperlipidemia 11/11/2013  . Memory deficit 11/11/2013   Outpatient Encounter Prescriptions as of 11/08/2014  Medication Sig  . alendronate (FOSAMAX) 70 MG tablet Take 70 mg by mouth once a week. Take with a full glass of water on an empty stomach.  . ezetimibe (ZETIA) 10 MG tablet Take 10 mg by mouth daily.  . Memantine HCl-Donepezil HCl (NAMZARIC) 28-10 MG CP24 Take 1 tablet by mouth at bedtime.  . valsartan (DIOVAN) 160 MG tablet Take 160 mg by mouth daily.  . Vitamin D, Ergocalciferol, (DRISDOL) 50000 UNITS CAPS capsule Take 1 capsule (50,000 Units total) by mouth every 7 (seven) days.  . [DISCONTINUED] donepezil (ARICEPT) 10 MG tablet Take 10 mg by  mouth at bedtime.  . [DISCONTINUED] Memantine HCl ER (NAMENDA XR TITRATION PACK) 7 & 14 & 21 &28 MG CP24 Take per package directiond  . [DISCONTINUED] Vitamin D, Ergocalciferol, (DRISDOL) 50000 UNITS CAPS capsule Take 1 capsule (50,000 Units total) by mouth every 7 (seven) days.    Review of Systems  Constitutional: Negative.   HENT: Negative.   Eyes: Negative.   Respiratory: Negative.   Cardiovascular: Negative.   Gastrointestinal: Negative.   Endocrine: Negative.   Genitourinary: Negative.   Musculoskeletal: Positive for back pain.  Skin: Negative.   Allergic/Immunologic: Negative.   Neurological: Negative.   Hematological: Negative.   Psychiatric/Behavioral: Negative.        Objective:   Physical Exam  Constitutional: She is oriented to person, place, and time. She appears well-developed and well-nourished. No distress.  HENT:  Head: Normocephalic and atraumatic.  Right Ear: External ear normal.  Left Ear: External ear normal.  Nose: Nose normal.  The throat was slightly red posteriorly and there is nasal congestion bilaterally.  Eyes: Conjunctivae and EOM are normal. Pupils are equal, round, and reactive to light. Right eye exhibits no discharge. Left eye exhibits no discharge. No scleral icterus.  Neck: Normal range of motion. Neck supple. No thyromegaly present.  No anterior cervical nodes or carotid bruits  Cardiovascular: Normal rate, regular rhythm, normal heart sounds and intact distal pulses.  Exam reveals no gallop and no friction rub.   No murmur heard. Pulmonary/Chest: Effort normal. No respiratory distress. She has  no wheezes. She has no rales. She exhibits no tenderness.  The lungs were clear anteriorly and posteriorly until the patient cough and she has a lot of congestion with coughing and the bronchial tree. No wheezing. There were no rales.  Abdominal: Soft. Bowel sounds are normal. She exhibits no mass. There is no tenderness. There is no rebound and no  guarding.  Musculoskeletal: Normal range of motion. She exhibits no edema.  Lymphadenopathy:    She has no cervical adenopathy.  Neurological: She is alert and oriented to person, place, and time. She has normal reflexes. No cranial nerve deficit.  Skin: Skin is warm and dry. No rash noted.  Psychiatric: She has a normal mood and affect. Her behavior is normal. Judgment and thought content normal.  The patient was alert and responded appropriately to questions asked of her.  Nursing note and vitals reviewed.  BP 137/81 mmHg  Pulse 67  Temp(Src) 97.8 F (36.6 C) (Oral)  Ht '5\' 2"'  (1.575 m)  Wt 151 lb (68.493 kg)  BMI 27.61 kg/m2  A urine was given for a urinalysis after was not available at the time the patient was seen in the exam room.      Assessment & Plan:  1. Gastroesophageal reflux disease, esophagitis presence not specified -The patient had no complaints with [place] today. - POCT CBC  2. Essential hypertension -The blood pressure is under good control with her current treatment. - POCT CBC - BMP8+EGFR - Hepatic function panel  3. Hyperlipidemia -Lipids will be checked today and any change in medicine will be called in to the assisted living facility once the lab work is back. - POCT CBC - Lipid panel  4. Memory deficit -This appears to be stable with no worsening of memory with her current treatment - POCT CBC  5. Vitamin D deficiency -Continue current treatment pending lab work - POCT CBC - Vit D  25 hydroxy (rtn osteoporosis monitoring)  6. Back pain, unspecified location -We will check a urine on the way out today to make sure this is clear of infection - POCT CBC  7. Acute bronchitis, unspecified organism -Drink plenty of fluids, take Mucinex twice daily and take antibiotic as directed - azithromycin (ZITHROMAX) 250 MG tablet; 2 pills the first day then one daily for infection until completed  Dispense: 6 tablet; Refill: 0  Patient Instructions                        Medicare Annual Wellness Visit  [facility] and the medical providers at [facility] strive to bring you the best medical care.  In doing so we not only want to address your current medical conditions and concerns but also to detect new conditions early and prevent illness, disease and health-related problems.    Medicare offers a yearly Wellness Visit which allows our clinical staff to assess your need for preventative services including immunizations, lifestyle education, counseling to decrease risk of preventable diseases and screening for fall risk and other medical concerns.    This visit is provided free of charge (no copay) for all Medicare recipients. The clinical pharmacists at [facility] have begun to conduct these Wellness Visits which will also include a thorough review of all your medications.    As you primary medical provider recommend that you make an appointment for your Annual Wellness Visit if you have not done so already this year.  You may set up  this appointment before you leave today or you may call back ([phone]) and schedule an appointment.  Please make sure when you call that you mention that you are scheduling your Annual Wellness Visit with the clinical pharmacist so that the appointment may be made for the proper length of time.     Continue current medications. Continue good therapeutic lifestyle changes which include good diet and exercise. Fall precautions discussed with patient. If an FOBT was given today- please return it to our front desk. If you are over 38 years old - you may need Prevnar 78 or the adult Pneumonia vaccine.  Flu Shots are still available at our office. If you still haven't had one please call to set up a nurse visit to get one.   After your visit with [place] today you will receive a survey in the mail or online from [employer] regarding your care with [place]. Please take a moment to fill this  out. Your feedback is very important to [place] as you can help [place] better understand your patient needs as well as improve your experience and satisfaction. WE CARE ABOUT YOU!!!   Drink plenty of fluids Take Tylenol for aches pains and fever Take Mucinex maximum strength plain, blue and white in color, 1 twice daily with a large glass of water for cough and congestion for at least 2 weeks. Take antibiotic as directed Use saline nose spray to each nostril 1 spray 4 times daily for 2 weeks   Arrie Senate MD

## 2014-11-08 NOTE — Patient Instructions (Addendum)
Medicare Annual Wellness Visit  Vickie Hall and the medical providers at [facility] strive to bring you the best medical care.  In doing so we not only want to address your current medical conditions and concerns but also to detect new conditions early and prevent illness, disease and health-related problems.    Medicare offers a yearly Wellness Visit which allows our clinical staff to assess your need for preventative services including immunizations, lifestyle education, counseling to decrease risk of preventable diseases and screening for fall risk and other medical concerns.    This visit is provided free of charge (no copay) for all Medicare recipients. The clinical pharmacists at [facility] have begun to conduct these Wellness Visits which will also include a thorough review of all your medications.    As you primary medical provider recommend that you make an appointment for your Annual Wellness Visit if you have not done so already this year.  You may set up this appointment before you leave today or you may call back [phone] and schedule an appointment.  Please make sure when you call that you mention that you are scheduling your Annual Wellness Visit with the clinical pharmacist so that the appointment may be made for the proper length of time.     Continue current medications. Continue good therapeutic lifestyle changes which include good diet and exercise. Fall precautions discussed with patient. If an FOBT was given today- please return it to our front desk. If you are over 79 years old - you may need Prevnar 13 or the adult Pneumonia vaccine.  Flu Shots are still available at our office. If you still haven't had one please call to set up a nurse visit to get one.   After your visit with us today you will receive a survey in the mail or online from [employer] regarding your care with us. Please take a moment to  fill this out. Your feedback is very important to us as you can help us better understand your patient needs as well as improve your experience and satisfaction. WE CARE ABOUT YOU!!!   Drink plenty of fluids Take Tylenol for aches pains and fever Take Mucinex maximum strength plain, blue and white in color, 1 twice daily with a large glass of water for cough and congestion for at least 2 weeks. Take antibiotic as directed Use saline nose spray to each nostril 1 spray 4 times daily for 2 weeks

## 2014-11-08 NOTE — Addendum Note (Signed)
Addended by: Tommas OlpHANDY, Jovannie Ulibarri N on: 11/08/2014 04:48 PM   Modules accepted: Orders

## 2014-11-09 ENCOUNTER — Encounter: Payer: Self-pay | Admitting: Family Medicine

## 2014-11-09 LAB — HEPATIC FUNCTION PANEL
ALT: 16 IU/L (ref 0–32)
AST: 19 IU/L (ref 0–40)
Albumin: 4.2 g/dL (ref 3.5–4.7)
Alkaline Phosphatase: 67 IU/L (ref 39–117)
Bilirubin Total: 0.4 mg/dL (ref 0.0–1.2)
Bilirubin, Direct: 0.12 mg/dL (ref 0.00–0.40)
Total Protein: 6.7 g/dL (ref 6.0–8.5)

## 2014-11-09 LAB — VITAMIN D 25 HYDROXY (VIT D DEFICIENCY, FRACTURES): Vit D, 25-Hydroxy: 30.7 ng/mL (ref 30.0–100.0)

## 2014-11-09 LAB — BMP8+EGFR
BUN/Creatinine Ratio: 22 (ref 11–26)
BUN: 19 mg/dL (ref 8–27)
CALCIUM: 9.2 mg/dL (ref 8.7–10.3)
CO2: 24 mmol/L (ref 18–29)
CREATININE: 0.85 mg/dL (ref 0.57–1.00)
Chloride: 101 mmol/L (ref 97–108)
GFR calc non Af Amer: 63 mL/min/{1.73_m2} (ref >59)
GFR, EST AFRICAN AMERICAN: 73 mL/min/{1.73_m2} (ref >59)
GLUCOSE: 103 mg/dL — AB (ref 65–99)
Potassium: 4.2 mmol/L (ref 3.5–5.2)
Sodium: 142 mmol/L (ref 134–144)

## 2014-11-09 LAB — LIPID PANEL
Chol/HDL Ratio: 5.6 ratio units — ABNORMAL HIGH (ref 0.0–4.4)
Cholesterol, Total: 184 mg/dL (ref 100–199)
HDL: 33 mg/dL — AB (ref >39)
LDL CALC: 125 mg/dL — AB (ref 0–99)
TRIGLYCERIDES: 131 mg/dL (ref 0–149)
VLDL Cholesterol Cal: 26 mg/dL (ref 5–40)

## 2014-11-10 LAB — URINE CULTURE

## 2015-03-26 ENCOUNTER — Encounter: Payer: Self-pay | Admitting: *Deleted

## 2015-05-02 DIAGNOSIS — Z961 Presence of intraocular lens: Secondary | ICD-10-CM | POA: Diagnosis not present

## 2015-05-02 DIAGNOSIS — H43812 Vitreous degeneration, left eye: Secondary | ICD-10-CM | POA: Diagnosis not present

## 2015-05-06 ENCOUNTER — Encounter: Payer: Self-pay | Admitting: Family Medicine

## 2015-05-06 ENCOUNTER — Ambulatory Visit (INDEPENDENT_AMBULATORY_CARE_PROVIDER_SITE_OTHER): Payer: Commercial Managed Care - HMO | Admitting: Family Medicine

## 2015-05-06 VITALS — BP 129/70 | HR 50 | Temp 97.0°F | Ht 62.0 in | Wt 148.0 lb

## 2015-05-06 DIAGNOSIS — I10 Essential (primary) hypertension: Secondary | ICD-10-CM | POA: Diagnosis not present

## 2015-05-06 DIAGNOSIS — E785 Hyperlipidemia, unspecified: Secondary | ICD-10-CM | POA: Diagnosis not present

## 2015-05-06 DIAGNOSIS — R413 Other amnesia: Secondary | ICD-10-CM | POA: Diagnosis not present

## 2015-05-06 DIAGNOSIS — E559 Vitamin D deficiency, unspecified: Secondary | ICD-10-CM

## 2015-05-06 DIAGNOSIS — K219 Gastro-esophageal reflux disease without esophagitis: Secondary | ICD-10-CM | POA: Diagnosis not present

## 2015-05-06 NOTE — Patient Instructions (Addendum)
Medicare Annual Wellness Visit  [facility] and the medical providers at [facility] strive to bring you the best medical care.  In doing so we not only want to address your current medical conditions and concerns but also to detect new conditions early and prevent illness, disease and health-related problems.    Medicare offers a yearly Wellness Visit which allows our clinical staff to assess your need for preventative services including immunizations, lifestyle education, counseling to decrease risk of preventable diseases and screening for fall risk and other medical concerns.    This visit is provided free of charge (no copay) for all Medicare recipients. The clinical pharmacists at [facility] have begun to conduct these Wellness Visits which will also include a thorough review of all your medications.    As you primary medical provider recommend that you make an appointment for your Annual Wellness Visit if you have not done so already this year.  You may set up this appointment before you leave today or you may call back [phone]) and schedule an appointment.  Please make sure when you call that you mention that you are scheduling your Annual Wellness Visit with the clinical pharmacist so that the appointment may be made for the proper length of time.     Continue current medications. Continue good therapeutic lifestyle changes which include good diet and exercise. Fall precautions discussed with patient. If an FOBT was given today- please return it to our front desk. If you are over 3 years old - you may need Prevnar 13 or the adult Pneumonia vaccine.  **Flu shots will be available soon--- please call and schedule a FLU-CLINIC appointment**  After your visit with [place] today you will receive a survey in the mail or online from [employer] regarding your care with [place]. Please take a moment to fill this out. Your feedback is  very important to [place] as you can help [place] better understand your patient needs as well as improve your experience and satisfaction. WE CARE ABOUT YOU!!!   **Please join [place] SEPT.22, 2016 from 5:00 to 7:00pm for our OPEN HOUSE! Come out and meet our NEW providers**  Continue current treatment Always continue to be careful and did not put yourself at risk for falling Make sure you drink plenty of fluids throughout the day and stay well hydrated The patient should continue to stay active and all the events that she can participate in

## 2015-05-06 NOTE — Progress Notes (Signed)
Subjective:    Patient ID: Vickie Hall, female    DOB: 1928-08-29, 81 y.o.   MRN: [id number]  HPI Pt here for follow up and management of chronic medical problems which includes hypertension and hyperlipidemia. She is taking medications regularly and is accompanied today by Vickie Hall, from the assisted living facility. The patient has no complaints as usual. The initial impression is that she smiling appears to be happy and is physically in good shape. According to the caregiver the patient is active in all of the events and she has friends in the memory unit that she hangs out with and does things with. The patient says that she helps keep her room clean and she appears to be very happy with her situation. She denies chest pain shortness of breath and trouble swallowing heartburn indigestion nausea vomiting diarrhea or problems passing her water.       Patient Active Problem List   Diagnosis Date Noted  . Osteoporosis, senile 12/16/2013  . GERD (gastroesophageal reflux disease) 11/11/2013  . HTN (hypertension) 11/11/2013  . Hyperlipidemia 11/11/2013  . Memory deficit 11/11/2013   Outpatient Encounter Prescriptions as of 05/06/2015  Medication Sig  . alendronate (FOSAMAX) 70 MG tablet Take 70 mg by mouth once a week. Take with a full glass of water on an empty stomach.  . ezetimibe (ZETIA) 10 MG tablet Take 10 mg by mouth daily.  . Memantine HCl-Donepezil HCl (NAMZARIC) 28-10 MG CP24 Take 1 tablet by mouth at bedtime.  . valsartan (DIOVAN) 160 MG tablet Take 160 mg by mouth daily.  . Vitamin D, Ergocalciferol, (DRISDOL) 50000 UNITS CAPS capsule Take 1 capsule (50,000 Units total) by mouth every 7 (seven) days.  . [DISCONTINUED] azithromycin (ZITHROMAX) 250 MG tablet 2 pills the first day then one daily for infection until completed   No facility-administered encounter medications on file as of 05/06/2015.     Review of Systems  Constitutional: Negative.   HENT: Negative.   Eyes:  Negative.   Respiratory: Negative.   Cardiovascular: Negative.   Gastrointestinal: Negative.   Endocrine: Negative.   Genitourinary: Negative.   Musculoskeletal: Negative.   Skin: Negative.   Allergic/Immunologic: Negative.   Neurological: Negative.   Hematological: Negative.   Psychiatric/Behavioral: Negative.        Objective:   Physical Exam  Constitutional: She is oriented to person, place, and time. She appears well-developed and well-nourished. No distress.  HENT:  Head: Normocephalic and atraumatic.  Right Ear: External ear normal.  Left Ear: External ear normal.  Nose: Nose normal.  Mouth/Throat: No oropharyngeal exudate.  Eyes: Conjunctivae and EOM are normal. Pupils are equal, round, and reactive to light. Right eye exhibits no discharge. Left eye exhibits no discharge. No scleral icterus.  Neck: Normal range of motion. Neck supple. No thyromegaly present.  Cardiovascular: Normal rate, regular rhythm, normal heart sounds and intact distal pulses.  Exam reveals no gallop and no friction rub.   No murmur heard. The heart has a regular rate and rhythm at 72/m  Pulmonary/Chest: Effort normal and breath sounds normal. No respiratory distress. She has no wheezes. She has no rales. She exhibits no tenderness.  Abdominal: Soft. Bowel sounds are normal. She exhibits no mass. There is no tenderness. There is no rebound and no guarding.  Musculoskeletal: Normal range of motion. She exhibits no edema or tenderness.  The patient did have some slight discomfort in her back with laying on the table and getting up from the exam table. Other  than that she had good range of motion of all extremities.  Lymphadenopathy:    She has no cervical adenopathy.  Neurological: She is alert and oriented to person, place, and time. She has normal reflexes. No cranial nerve deficit.  Today she was alert and appeared oriented. When I ask her how she was she said 63 and then she laughed.  Skin: Skin is  warm and dry. No rash noted.  Psychiatric: She has a normal mood and affect. Her behavior is normal. Judgment and thought content normal.  Nursing note and vitals reviewed.  BP 129/70 mmHg  Pulse 50  Temp(Src) 97 F (36.1 C) (Oral)  Ht '5\' 2"'  (1.575 m)  Wt 148 lb (67.132 kg)  BMI 27.06 kg/m2        Assessment & Plan:  1. Essential hypertension -Continue with current blood pressure medication - CBC with Differential/Platelet - BMP8+EGFR - Hepatic function panel  2. Hyperlipidemia -Continue with said he and aggressive therapeutic lifestyle changes - CBC with Differential/Platelet - Lipid panel  3. Gastroesophageal reflux disease, esophagitis presence not specified -She had no complaints with reflux today and she is currently not taking any medication for this - CBC with Differential/Platelet - Hepatic function panel  4. Vitamin D deficiency -Continue with vitamin D replacement pending results of lab work - CBC with Differential/Platelet - Vit D  25 hydroxy (rtn osteoporosis monitoring)  5. Memory disorder -Continue with namzeric  Patient Instructions                       Medicare Annual Wellness Visit  Vickie Hall and the medical providers at [facility] strive to bring you the best medical care.  In doing so we not only want to address your current medical conditions and concerns but also to detect new conditions early and prevent illness, disease and health-related problems.    Medicare offers a yearly Wellness Visit which allows our clinical staff to assess your need for preventative services including immunizations, lifestyle education, counseling to decrease risk of preventable diseases and screening for fall risk and other medical concerns.    This visit is provided free of charge (no copay) for all Medicare recipients. The clinical pharmacists at [facility] have begun to conduct these Wellness Visits which will also  include a thorough review of all your medications.    As you primary medical provider recommend that you make an appointment for your Annual Wellness Visit if you have not done so already this year.  You may set up this appointment before you leave today or you may call back [phone]) and schedule an appointment.  Please make sure when you call that you mention that you are scheduling your Annual Wellness Visit with the clinical pharmacist so that the appointment may be made for the proper length of time.     Continue current medications. Continue good therapeutic lifestyle changes which include good diet and exercise. Fall precautions discussed with patient. If an FOBT was given today- please return it to our front desk. If you are over 61 years old - you may need Prevnar 58 or the adult Pneumonia vaccine.  **Flu shots will be available soon--- please call and schedule a FLU-CLINIC appointment**  After your visit with [place] today you will receive a survey in the mail or online from [employer] regarding your care with [place]. Please take a moment to fill this out. Your feedback is very important to  [place] as you can help [place] better understand your patient needs as well as improve your experience and satisfaction. WE CARE ABOUT YOU!!!   **Please join [place] SEPT.22, 2016 from 5:00 to 7:00pm for our OPEN HOUSE! Come out and meet our NEW providers**  Continue current treatment Always continue to be careful and did not put yourself at risk for falling Make sure you drink plenty of fluids throughout the day and stay well hydrated The patient should continue to stay active and all the events that she can participate in   Arrie Senate MD

## 2015-05-07 LAB — LIPID PANEL
Chol/HDL Ratio: 4.1 ratio units (ref 0.0–4.4)
Cholesterol, Total: 212 mg/dL — ABNORMAL HIGH (ref 100–199)
HDL: 52 mg/dL (ref >39)
LDL CALC: 126 mg/dL — AB (ref 0–99)
Triglycerides: 170 mg/dL — ABNORMAL HIGH (ref 0–149)
VLDL CHOLESTEROL CAL: 34 mg/dL (ref 5–40)

## 2015-05-07 LAB — HEPATIC FUNCTION PANEL
ALT: 15 IU/L (ref 0–32)
AST: 16 IU/L (ref 0–40)
Albumin: 4.5 g/dL (ref 3.5–4.7)
Alkaline Phosphatase: 66 IU/L (ref 39–117)
BILIRUBIN TOTAL: 0.3 mg/dL (ref 0.0–1.2)
Bilirubin, Direct: 0.11 mg/dL (ref 0.00–0.40)
TOTAL PROTEIN: 6.7 g/dL (ref 6.0–8.5)

## 2015-05-07 LAB — BMP8+EGFR
BUN / CREAT RATIO: 22 (ref 11–26)
BUN: 19 mg/dL (ref 8–27)
CALCIUM: 9.4 mg/dL (ref 8.7–10.3)
CHLORIDE: 100 mmol/L (ref 97–108)
CO2: 28 mmol/L (ref 18–29)
Creatinine, Ser: 0.87 mg/dL (ref 0.57–1.00)
GFR calc non Af Amer: 61 mL/min/{1.73_m2} (ref >59)
GFR, EST AFRICAN AMERICAN: 70 mL/min/{1.73_m2} (ref >59)
GLUCOSE: 128 mg/dL — AB (ref 65–99)
POTASSIUM: 4 mmol/L (ref 3.5–5.2)
Sodium: 142 mmol/L (ref 134–144)

## 2015-05-07 LAB — CBC WITH DIFFERENTIAL/PLATELET
Basophils Absolute: 0 10*3/uL (ref 0.0–0.2)
Basos: 0 %
EOS (ABSOLUTE): 0.2 10*3/uL (ref 0.0–0.4)
EOS: 3 %
HEMATOCRIT: 41.5 % (ref 34.0–46.6)
HEMOGLOBIN: 13.7 g/dL (ref 11.1–15.9)
Immature Grans (Abs): 0 10*3/uL (ref 0.0–0.1)
Immature Granulocytes: 0 %
LYMPHS ABS: 2.6 10*3/uL (ref 0.7–3.1)
Lymphs: 39 %
MCH: 31 pg (ref 26.6–33.0)
MCHC: 33 g/dL (ref 31.5–35.7)
MCV: 94 fL (ref 79–97)
MONOCYTES: 11 %
Monocytes Absolute: 0.8 10*3/uL (ref 0.1–0.9)
NEUTROS ABS: 3.2 10*3/uL (ref 1.4–7.0)
Neutrophils: 47 %
Platelets: 244 10*3/uL (ref 150–379)
RBC: 4.42 x10E6/uL (ref 3.77–5.28)
RDW: 14.4 % (ref 12.3–15.4)
WBC: 6.8 10*3/uL (ref 3.4–10.8)

## 2015-05-07 LAB — VITAMIN D 25 HYDROXY (VIT D DEFICIENCY, FRACTURES): Vit D, 25-Hydroxy: 25.2 ng/mL — ABNORMAL LOW (ref 30.0–100.0)

## 2015-05-09 ENCOUNTER — Ambulatory Visit: Payer: Commercial Managed Care - HMO | Admitting: Family Medicine

## 2015-05-13 ENCOUNTER — Ambulatory Visit: Payer: Commercial Managed Care - HMO | Admitting: Family Medicine

## 2015-07-22 ENCOUNTER — Ambulatory Visit (INDEPENDENT_AMBULATORY_CARE_PROVIDER_SITE_OTHER): Payer: Commercial Managed Care - HMO

## 2015-07-22 ENCOUNTER — Encounter: Payer: Self-pay | Admitting: Family Medicine

## 2015-07-22 ENCOUNTER — Ambulatory Visit (INDEPENDENT_AMBULATORY_CARE_PROVIDER_SITE_OTHER): Payer: Commercial Managed Care - HMO | Admitting: Family Medicine

## 2015-07-22 VITALS — BP 104/65 | HR 45 | Temp 97.3°F | Ht 62.0 in | Wt 148.0 lb

## 2015-07-22 DIAGNOSIS — M25551 Pain in right hip: Secondary | ICD-10-CM

## 2015-07-22 DIAGNOSIS — Z23 Encounter for immunization: Secondary | ICD-10-CM

## 2015-07-22 DIAGNOSIS — M25561 Pain in right knee: Secondary | ICD-10-CM

## 2015-07-22 NOTE — Progress Notes (Signed)
Subjective:    Patient ID: Vickie Hall, female    DOB: Mar 10, 1928, 81 y.o.   MRN: [id number]  HPI Patient here today for right thigh pain that started about 3 days ago. Per the caregiver / driver with her, she hasn't been able to put weight on the right leg. This patient has dementia. She stays in her room a lot by herself. There is been no witnessed fall. A caregiver from the assisted living facility comes with her to the visit today.       Patient Active Problem List   Diagnosis Date Noted  . Osteoporosis, senile 12/16/2013  . GERD (gastroesophageal reflux disease) 11/11/2013  . HTN (hypertension) 11/11/2013  . Hyperlipidemia 11/11/2013  . Memory deficit 11/11/2013   Outpatient Encounter Prescriptions as of 07/22/2015  Medication Sig  . alendronate (FOSAMAX) 70 MG tablet Take 70 mg by mouth once a week. Take with a full glass of water on an empty stomach.  . ezetimibe (ZETIA) 10 MG tablet Take 10 mg by mouth daily.  . Memantine HCl-Donepezil HCl (NAMZARIC) 28-10 MG CP24 Take 1 tablet by mouth at bedtime.  . valsartan (DIOVAN) 160 MG tablet Take 160 mg by mouth daily.  . Vitamin D, Ergocalciferol, (DRISDOL) 50000 UNITS CAPS capsule Take 1 capsule (50,000 Units total) by mouth every 7 (seven) days.   No facility-administered encounter medications on file as of 07/22/2015.     Review of Systems  Constitutional: Negative.   HENT: Negative.   Eyes: Negative.   Respiratory: Negative.   Cardiovascular: Negative.   Gastrointestinal: Negative.   Endocrine: Negative.   Genitourinary: Negative.   Musculoskeletal: Positive for arthralgias (right hip / thigh pain).  Skin: Negative.   Allergic/Immunologic: Negative.   Neurological: Negative.   Hematological: Negative.   Psychiatric/Behavioral: Negative.        Objective:   Physical Exam  Constitutional: She appears well-developed and well-nourished. No distress.  HENT:  Head: Normocephalic and atraumatic.  Eyes:  Conjunctivae and EOM are normal. Pupils are equal, round, and reactive to light. Right eye exhibits no discharge.  Neck: Normal range of motion.  Musculoskeletal: She exhibits tenderness. She exhibits no edema.  There is no rubor or edema. The patient has good hip mobility with internal and external rotation. She is tender at the bilateral joint lines of the right knee. This seems to elicit her pain more.  Neurological:  The patient has dementia and does not recall falling  Skin: Skin is warm and dry. No rash noted. No erythema.  Psychiatric: She has a normal mood and affect. Her behavior is normal. Judgment and thought content normal.  Nursing note and vitals reviewed.  BP 104/65 mmHg  Pulse 45  Temp(Src) 97.3 F (36.3 C) (Oral)  Ht 5\' 2"  (1.575 m)  Wt 148 lb (67.132 kg)  BMI 27.06 kg/m2  [facility] reading (PRIMARY) by  Dr. Myrene GalasMoore-bilateral hips degenerative changes, right femur negative for fracture, right knee -degenerative changes with questionable calcium nodule                                 Assessment & Plan:  1. Right hip pain -Take Tylenol for pain as needed - DG HIP UNILAT W OR W/O PELVIS 2-3 VIEWS RIGHT; Future  2. Right knee pain -Take Tylenol as needed for pain more regularly and use warm wet compresses -Weightbearing only with assistance, otherwise use wheelchair - DG Knee 1-2 Views  Right; Future  Patient Instructions  Warm compresses to the right knee 20 minutes 3 or 4 times daily The patient should only walk with assistance She should use a wheelchair for any distance walking.   Nyra Capes MD

## 2015-07-22 NOTE — Patient Instructions (Signed)
Warm compresses to the right knee 20 minutes 3 or 4 times daily The patient should only walk with assistance She should use a wheelchair for any distance walking.

## 2015-08-10 DIAGNOSIS — I10 Essential (primary) hypertension: Secondary | ICD-10-CM | POA: Diagnosis not present

## 2015-08-10 DIAGNOSIS — M25561 Pain in right knee: Secondary | ICD-10-CM | POA: Diagnosis not present

## 2015-08-10 DIAGNOSIS — Z6827 Body mass index (BMI) 27.0-27.9, adult: Secondary | ICD-10-CM | POA: Diagnosis not present

## 2015-08-17 DIAGNOSIS — Z9889 Other specified postprocedural states: Secondary | ICD-10-CM | POA: Diagnosis not present

## 2015-08-17 DIAGNOSIS — M25561 Pain in right knee: Secondary | ICD-10-CM | POA: Diagnosis not present

## 2015-08-17 DIAGNOSIS — M179 Osteoarthritis of knee, unspecified: Secondary | ICD-10-CM | POA: Diagnosis not present

## 2015-08-19 DIAGNOSIS — Z6827 Body mass index (BMI) 27.0-27.9, adult: Secondary | ICD-10-CM | POA: Diagnosis not present

## 2015-08-19 DIAGNOSIS — I10 Essential (primary) hypertension: Secondary | ICD-10-CM | POA: Diagnosis not present

## 2015-08-19 DIAGNOSIS — M25561 Pain in right knee: Secondary | ICD-10-CM | POA: Diagnosis not present

## 2015-09-06 DIAGNOSIS — Z6827 Body mass index (BMI) 27.0-27.9, adult: Secondary | ICD-10-CM | POA: Diagnosis not present

## 2015-09-06 DIAGNOSIS — M25561 Pain in right knee: Secondary | ICD-10-CM | POA: Diagnosis not present

## 2015-09-06 DIAGNOSIS — Z8719 Personal history of other diseases of the digestive system: Secondary | ICD-10-CM | POA: Diagnosis not present

## 2015-09-06 DIAGNOSIS — I10 Essential (primary) hypertension: Secondary | ICD-10-CM | POA: Diagnosis not present

## 2015-11-11 ENCOUNTER — Ambulatory Visit: Payer: Commercial Managed Care - HMO | Admitting: Family Medicine

## 2015-11-14 ENCOUNTER — Ambulatory Visit (INDEPENDENT_AMBULATORY_CARE_PROVIDER_SITE_OTHER): Payer: Commercial Managed Care - HMO | Admitting: Family Medicine

## 2015-11-14 ENCOUNTER — Encounter: Payer: Self-pay | Admitting: Family Medicine

## 2015-11-14 VITALS — BP 141/77 | HR 44 | Temp 97.1°F | Ht 62.0 in | Wt 143.0 lb

## 2015-11-14 DIAGNOSIS — Z0000 Encounter for general adult medical examination without abnormal findings: Secondary | ICD-10-CM

## 2015-11-14 DIAGNOSIS — E785 Hyperlipidemia, unspecified: Secondary | ICD-10-CM

## 2015-11-14 DIAGNOSIS — K219 Gastro-esophageal reflux disease without esophagitis: Secondary | ICD-10-CM | POA: Diagnosis not present

## 2015-11-14 DIAGNOSIS — I10 Essential (primary) hypertension: Secondary | ICD-10-CM | POA: Diagnosis not present

## 2015-11-14 DIAGNOSIS — E559 Vitamin D deficiency, unspecified: Secondary | ICD-10-CM | POA: Diagnosis not present

## 2015-11-14 DIAGNOSIS — R413 Other amnesia: Secondary | ICD-10-CM

## 2015-11-14 NOTE — Progress Notes (Signed)
Subjective:    Patient ID: Vickie Hall, female    DOB: 1928/07/31, 82 y.o.   MRN: [id number]  HPI Pt here for follow up and management of chronic medical problems which includes hyperlipidemia and hypertension. She is taking medications regularly. This patient is a resident at [facility], the memory unit. Patient stable and doing well and is not noted to have any problems or complaints. She comes with one of the caregivers from [facility]. She denies any chest pain shortness of breath trouble with her intestinal tract or trouble with passing her water. She remains interactive with the patient's and is said to be one of the better patients in the memory unit. She still has short-term memory issues and she is taking namzeric.    Patient Active Problem List   Diagnosis Date Noted  . Osteoporosis, senile 12/16/2013  . GERD (gastroesophageal reflux disease) 11/11/2013  . HTN (hypertension) 11/11/2013  . Hyperlipidemia 11/11/2013  . Memory deficit 11/11/2013   Outpatient Encounter Prescriptions as of 11/14/2015  Medication Sig  . alendronate (FOSAMAX) 70 MG tablet Take 70 mg by mouth once a week. Take with a full glass of water on an empty stomach.  . ezetimibe (ZETIA) 10 MG tablet Take 10 mg by mouth daily.  . Memantine HCl-Donepezil HCl (NAMZARIC) 28-10 MG CP24 Take 1 tablet by mouth at bedtime.  . valsartan (DIOVAN) 160 MG tablet Take 160 mg by mouth daily.  . Vitamin D, Ergocalciferol, (DRISDOL) 50000 UNITS CAPS capsule Take 1 capsule (50,000 Units total) by mouth every 7 (seven) days.   No facility-administered encounter medications on file as of 11/14/2015.       Review of Systems  Constitutional: Negative.   HENT: Negative.   Eyes: Negative.   Respiratory: Negative.   Cardiovascular: Negative.   Gastrointestinal: Negative.   Endocrine: Negative.   Genitourinary: Negative.   Musculoskeletal: Negative.   Skin: Negative.   Allergic/Immunologic:  Negative.   Neurological: Negative.   Hematological: Negative.   Psychiatric/Behavioral: Negative.        Objective:   Physical Exam  Constitutional: She is oriented to person, place, and time. She appears well-developed and well-nourished. No distress.  HENT:  Head: Normocephalic and atraumatic.  Right Ear: External ear normal.  Left Ear: External ear normal.  Nose: Nose normal.  Mouth/Throat: Oropharynx is clear and moist.  Eyes: Conjunctivae and EOM are normal. Pupils are equal, round, and reactive to light. Right eye exhibits no discharge. Left eye exhibits no discharge. No scleral icterus.  Neck: Normal range of motion. Neck supple. No thyromegaly present.  Cardiovascular: Normal rate, regular rhythm, normal heart sounds and intact distal pulses.   No murmur heard. Heart is regular at 72/m  Pulmonary/Chest: Effort normal and breath sounds normal. No respiratory distress. She has no wheezes. She has no rales. She exhibits no tenderness.  Abdominal: Soft. Bowel sounds are normal. She exhibits no mass. There is no tenderness. There is no rebound and no guarding.  Musculoskeletal: Normal range of motion. She exhibits no edema or tenderness.  Lymphadenopathy:    She has no cervical adenopathy.  Neurological: She is alert and oriented to person, place, and time. She has normal reflexes. No cranial nerve deficit.  Skin: Skin is warm and dry. No rash noted.  Psychiatric: She has a normal mood and affect. Her behavior is normal. Judgment and thought content normal.  Patient today was smiling and pleasant and responding appropriately to questions asked of her  but continues to have short-term memory issues  Nursing note and vitals reviewed.  BP 141/77 mmHg  Pulse 44  Temp(Src) 97.1 F (36.2 C) (Oral)  Ht _0  (1.575 m)  Wt 143 lb (64.864 kg)  BMI 26.15 kg/m2        Assessment & Plan:  1. Hyperlipidemia -Continue current treatment pending results of lab work - CBC with  Differential/Platelet - Lipid panel  2. Essential hypertension -The blood pressure slightly elevated today but no change in treatment - BMP8+EGFR - CBC with Differential/Platelet - Hepatic function panel  3. Gastroesophageal reflux disease, esophagitis presence not specified -No complaints of reflux symptoms - CBC with Differential/Platelet - Hepatic function panel  4. Vitamin D deficiency -Continue vitamin D replacement - CBC with Differential/Platelet - VITAMIN D 25 Hydroxy (Vit-D Deficiency, Fractures)  5. Memory disorder -Continue with Namenda and Aricept - CBC with Differential/Platelet  6. Memory deficit -Continue with Namenda and Aricept  Patient Instructions                       Medicare Annual Wellness Visit  [facility] and the medical providers at [facility] strive to bring you the best medical care.  In doing so we not only want to address your current medical conditions and concerns but also to detect new conditions early and prevent illness, disease and health-related problems.    Medicare offers a yearly Wellness Visit which allows our clinical staff to assess your need for preventative services including immunizations, lifestyle education, counseling to decrease risk of preventable diseases and screening for fall risk and other medical concerns.    This visit is provided free of charge (no copay) for all Medicare recipients. The clinical pharmacists at [facility] have begun to conduct these Wellness Visits which will also include a thorough review of all your medications.    As you primary medical provider recommend that you make an appointment for your Annual Wellness Visit if you have not done so already this year.  You may set up this appointment before you leave today or you may call back ([phone]) and schedule an appointment.  Please make sure when you call that you mention that you are scheduling your Annual  Wellness Visit with the clinical pharmacist so that the appointment may be made for the proper length of time.     Continue current medications. Continue good therapeutic lifestyle changes which include good diet and exercise. Fall precautions discussed with patient. If an FOBT was given today- please return it to our front desk. If you are over 6 years old - you may need Prevnar 72 or the adult Pneumonia vaccine.  **Flu shots are available--- please call and schedule a FLU-CLINIC appointment**  After your visit with [place] today you will receive a survey in the mail or online from [employer] regarding your care with [place]. Please take a moment to fill this out. Your feedback is very important to [place] as you can help [place] better understand your patient needs as well as improve your experience and satisfaction. WE CARE ABOUT YOU!!!   The patient should continue with her current medications We will send a copy of the lab work results as soon as those results become available with any changes that are necessary at that time   Arrie Senate MD

## 2015-11-14 NOTE — Patient Instructions (Addendum)
Medicare Annual Wellness Visit  [facility] and the medical providers at [facility] strive to bring you the best medical care.  In doing so we not only want to address your current medical conditions and concerns but also to detect new conditions early and prevent illness, disease and health-related problems.    Medicare offers a yearly Wellness Visit which allows our clinical staff to assess your need for preventative services including immunizations, lifestyle education, counseling to decrease risk of preventable diseases and screening for fall risk and other medical concerns.    This visit is provided free of charge (no copay) for all Medicare recipients. The clinical pharmacists at [facility] have begun to conduct these Wellness Visits which will also include a thorough review of all your medications.    As you primary medical provider recommend that you make an appointment for your Annual Wellness Visit if you have not done so already this year.  You may set up this appointment before you leave today or you may call back ([phone]) and schedule an appointment.  Please make sure when you call that you mention that you are scheduling your Annual Wellness Visit with the clinical pharmacist so that the appointment may be made for the proper length of time.     Continue current medications. Continue good therapeutic lifestyle changes which include good diet and exercise. Fall precautions discussed with patient. If an FOBT was given today- please return it to our front desk. If you are over 26 years old - you may need Prevnar 13 or the adult Pneumonia vaccine.  **Flu shots are available--- please call and schedule a FLU-CLINIC appointment**  After your visit with [place] today you will receive a survey in the mail or online from [employer] regarding your care with [place]. Please take a moment to fill this out. Your feedback is very  important to [place] as you can help [place] better understand your patient needs as well as improve your experience and satisfaction. WE CARE ABOUT YOU!!!   The patient should continue with her current medications We will send a copy of the lab work results as soon as those results become available with any changes that are necessary at that time

## 2015-11-14 NOTE — Addendum Note (Signed)
Addended by: Magdalene River on: 11/14/2015 04:02 PM   Modules accepted: Orders

## 2015-11-15 LAB — CBC WITH DIFFERENTIAL/PLATELET
BASOS ABS: 0 10*3/uL (ref 0.0–0.2)
Basos: 1 %
EOS (ABSOLUTE): 0.2 10*3/uL (ref 0.0–0.4)
Eos: 2 %
HEMOGLOBIN: 13.8 g/dL (ref 11.1–15.9)
Hematocrit: 41.7 % (ref 34.0–46.6)
IMMATURE GRANS (ABS): 0 10*3/uL (ref 0.0–0.1)
Immature Granulocytes: 0 %
LYMPHS: 41 %
Lymphocytes Absolute: 2.6 10*3/uL (ref 0.7–3.1)
MCH: 31.2 pg (ref 26.6–33.0)
MCHC: 33.1 g/dL (ref 31.5–35.7)
MCV: 94 fL (ref 79–97)
MONOCYTES: 11 %
Monocytes Absolute: 0.7 10*3/uL (ref 0.1–0.9)
Neutrophils Absolute: 2.9 10*3/uL (ref 1.4–7.0)
Neutrophils: 45 %
Platelets: 274 10*3/uL (ref 150–379)
RBC: 4.42 x10E6/uL (ref 3.77–5.28)
RDW: 13.7 % (ref 12.3–15.4)
WBC: 6.4 10*3/uL (ref 3.4–10.8)

## 2015-11-15 LAB — HEPATIC FUNCTION PANEL
ALBUMIN: 4.3 g/dL (ref 3.5–4.7)
ALK PHOS: 68 IU/L (ref 39–117)
ALT: 13 IU/L (ref 0–32)
AST: 16 IU/L (ref 0–40)
BILIRUBIN TOTAL: 0.3 mg/dL (ref 0.0–1.2)
BILIRUBIN, DIRECT: 0.11 mg/dL (ref 0.00–0.40)
TOTAL PROTEIN: 6.8 g/dL (ref 6.0–8.5)

## 2015-11-15 LAB — BMP8+EGFR
BUN / CREAT RATIO: 20 (ref 11–26)
BUN: 16 mg/dL (ref 8–27)
CALCIUM: 9.3 mg/dL (ref 8.7–10.3)
CHLORIDE: 101 mmol/L (ref 96–106)
CO2: 25 mmol/L (ref 18–29)
Creatinine, Ser: 0.79 mg/dL (ref 0.57–1.00)
GFR calc Af Amer: 79 mL/min/{1.73_m2} (ref >59)
GFR calc non Af Amer: 68 mL/min/{1.73_m2} (ref >59)
GLUCOSE: 110 mg/dL — AB (ref 65–99)
POTASSIUM: 4.4 mmol/L (ref 3.5–5.2)
Sodium: 141 mmol/L (ref 134–144)

## 2015-11-15 LAB — LIPID PANEL
CHOL/HDL RATIO: 4 ratio (ref 0.0–4.4)
Cholesterol, Total: 207 mg/dL — ABNORMAL HIGH (ref 100–199)
HDL: 52 mg/dL (ref >39)
LDL CALC: 128 mg/dL — AB (ref 0–99)
Triglycerides: 133 mg/dL (ref 0–149)
VLDL CHOLESTEROL CAL: 27 mg/dL (ref 5–40)

## 2015-11-15 LAB — THYROID PANEL WITH TSH
Free Thyroxine Index: 1.4 (ref 1.2–4.9)
T3 UPTAKE RATIO: 28 % (ref 24–39)
T4 TOTAL: 4.9 ug/dL (ref 4.5–12.0)
TSH: 4.64 u[IU]/mL — AB (ref 0.450–4.500)

## 2015-11-15 LAB — VITAMIN D 25 HYDROXY (VIT D DEFICIENCY, FRACTURES): Vit D, 25-Hydroxy: 30.7 ng/mL (ref 30.0–100.0)

## 2016-05-16 ENCOUNTER — Ambulatory Visit: Payer: Commercial Managed Care - HMO | Admitting: Family Medicine

## 2016-05-17 ENCOUNTER — Encounter: Payer: Self-pay | Admitting: Family Medicine

## 2016-05-28 ENCOUNTER — Ambulatory Visit (INDEPENDENT_AMBULATORY_CARE_PROVIDER_SITE_OTHER): Payer: Commercial Managed Care - HMO | Admitting: Family Medicine

## 2016-05-28 ENCOUNTER — Encounter: Payer: Self-pay | Admitting: Family Medicine

## 2016-05-28 ENCOUNTER — Ambulatory Visit (INDEPENDENT_AMBULATORY_CARE_PROVIDER_SITE_OTHER): Payer: Commercial Managed Care - HMO

## 2016-05-28 VITALS — BP 156/78 | HR 48 | Temp 96.2°F | Ht 62.0 in | Wt 143.0 lb

## 2016-05-28 DIAGNOSIS — E785 Hyperlipidemia, unspecified: Secondary | ICD-10-CM

## 2016-05-28 DIAGNOSIS — E559 Vitamin D deficiency, unspecified: Secondary | ICD-10-CM

## 2016-05-28 DIAGNOSIS — I10 Essential (primary) hypertension: Secondary | ICD-10-CM

## 2016-05-28 DIAGNOSIS — K219 Gastro-esophageal reflux disease without esophagitis: Secondary | ICD-10-CM | POA: Diagnosis not present

## 2016-05-28 DIAGNOSIS — R413 Other amnesia: Secondary | ICD-10-CM | POA: Diagnosis not present

## 2016-05-28 NOTE — Progress Notes (Signed)
Subjective:    Patient ID: Vickie Hall, female    DOB: [birth year], 82 y.o.   MRN: [id number]  HPI Pt here for follow up and management of chronic medical problems which includes hyperlipidemia and hypertension. She is taking medications regularly.The patient is doing well today with no complaints. She comes today with one of her caregivers from the assisted living facility. She will get a chest x-ray today. She'll be given an FOBT to return. She'll also get her lab work today. The patient denies any chest pain or shortness of breath. She denies any trouble with heartburn indigestion nausea vomiting diarrhea blood in the stool or black tarry bowel movements. The caregiver confirms that she is having no problems. She is passing her water without problems. The patient has never been a complainer. Memory-wise she seems to be stable over the past few years.      Patient Active Problem List   Diagnosis Date Noted  . Osteoporosis, senile 12/16/2013  . GERD (gastroesophageal reflux disease) 11/11/2013  . HTN (hypertension) 11/11/2013  . Hyperlipidemia 11/11/2013  . Memory deficit 11/11/2013   Outpatient Encounter Prescriptions as of 05/28/2016  Medication Sig  . alendronate (FOSAMAX) 70 MG tablet Take 70 mg by mouth once a week. Take with a full glass of water on an empty stomach.  . ezetimibe (ZETIA) 10 MG tablet Take 10 mg by mouth daily.  . Memantine HCl-Donepezil HCl (NAMZARIC) 28-10 MG CP24 Take 1 tablet by mouth at bedtime.  . valsartan (DIOVAN) 160 MG tablet Take 160 mg by mouth daily.  . Vitamin D, Ergocalciferol, (DRISDOL) 50000 UNITS CAPS capsule Take 1 capsule (50,000 Units total) by mouth every 7 (seven) days.   No facility-administered encounter medications on file as of 05/28/2016.       Review of Systems  Constitutional: Negative.   HENT: Negative.   Eyes: Negative.   Respiratory: Negative.   Cardiovascular: Negative.   Gastrointestinal: Negative.   Endocrine:  Negative.   Genitourinary: Negative.   Musculoskeletal: Negative.   Skin: Negative.   Allergic/Immunologic: Negative.   Neurological: Negative.   Hematological: Negative.   Psychiatric/Behavioral: Negative.        Objective:   Physical Exam  Constitutional: She is oriented to person, place, and time. She appears well-developed and well-nourished. No distress.  HENT:  Head: Normocephalic and atraumatic.  Left Ear: External ear normal.  Nose: Nose normal.  Mouth/Throat: Oropharynx is clear and moist.  Ears cerumen right ear canal  Eyes: Conjunctivae and EOM are normal. Pupils are equal, round, and reactive to light. Right eye exhibits no discharge. Left eye exhibits no discharge. No scleral icterus.  Neck: Normal range of motion. Neck supple. No thyromegaly present.  No bruits or thyromegaly or anterior cervical adenopathy  Cardiovascular: Normal rate, regular rhythm and normal heart sounds.   No murmur heard. The heart had a regular rate and rhythm at 60/m  Pulmonary/Chest: Effort normal and breath sounds normal. No respiratory distress. She has no wheezes. She has no rales. She exhibits no tenderness.  Both breasts were checked today and there were no masses. There is no axillary adenopathy. Lungs were clear anteriorly and posteriorly.  Abdominal: Soft. Bowel sounds are normal. She exhibits no mass. There is no tenderness. There is no rebound and no guarding.  No liver or spleen enlargement no suprapubic tenderness no bruits  Musculoskeletal: Normal range of motion. She exhibits no edema.  Lymphadenopathy:    She has no cervical adenopathy.  Neurological: She  is alert and oriented to person, place, and time. She has normal reflexes. No cranial nerve deficit.  Skin: Skin is warm and dry. No rash noted.  Psychiatric: She has a normal mood and affect. Her behavior is normal. Judgment and thought content normal.  Memory deficits remain but do not appear to be any worse than when last  checked.  Nursing note and vitals reviewed.  BP (!) 156/78 (BP Location: Right Arm)   Pulse (!) 48   Temp (!) 96.2 F (35.7 C) (Oral)   Ht _0  (1.575 m)   Wt 143 lb (64.9 kg)   BMI 26.16 kg/m   [facility] reading (PRIMARY) by  Dr. Brunilda Payor x-ray with results pending                                     Assessment & Plan:  1. Hyperlipidemia -Continue current treatment pending results of lab work - CBC with Differential/Platelet - Lipid panel  2. Essential hypertension -Blood pressure is elevated today. Please have the facility to check her blood pressure more frequently over the next 3 months and send those reports to [place] for review - BMP8+EGFR - CBC with Differential/Platelet - Hepatic function panel - DG Chest 2 View; Future  3. Gastroesophageal reflux disease, esophagitis presence not specified -She has no abdominal tenderness. - CBC with Differential/Platelet  4. Vitamin D deficiency -Continue current treatment pending results of lab work - CBC with Differential/Platelet - VITAMIN D 25 Hydroxy (Vit-D Deficiency, Fractures)  5. Memory disorder -Continue with Namzeric - CBC with Differential/Platelet  Patient Instructions                       Medicare Annual Wellness Visit  [facility] and the medical providers at [facility] strive to bring you the best medical care.  In doing so we not only want to address your current medical conditions and concerns but also to detect new conditions early and prevent illness, disease and health-related problems.    Medicare offers a yearly Wellness Visit which allows our clinical staff to assess your need for preventative services including immunizations, lifestyle education, counseling to decrease risk of preventable diseases and screening for fall risk and other medical concerns.    This visit is provided free of charge (no copay) for all Medicare recipients. The clinical pharmacists at [facility] have begun to conduct these Wellness Visits which will also include a thorough review of all your medications.    As you primary medical provider recommend that you make an appointment for your Annual Wellness Visit if you have not done so already this year.  You may set up this appointment before you leave today or you may call back ([phone]) and schedule an appointment.  Please make sure when you call that you mention that you are scheduling your Annual Wellness Visit with the clinical pharmacist so that the appointment may be made for the proper length of time.     Continue current medications. Continue good therapeutic lifestyle changes which include good diet and exercise. Fall precautions discussed with patient. If an FOBT was given today- please return it to our front desk. If you are over 13 years old - you may need Prevnar 11 or the adult Pneumonia vaccine.  **Flu shots are available--- please call and schedule a FLU-CLINIC appointment**  After your visit with  [place] today you will receive a survey in the mail or online from [employer] regarding your care with [place]. Please take a moment to fill this out. Your feedback is very important to [place] as you can help [place] better understand your patient needs as well as improve your experience and satisfaction. WE CARE ABOUT YOU!!!   We'll call with the results of the chest x-ray in the lab work results as soon as they become available It's important that she get her flu shot in early October She should be encouraged to drink plenty of fluids and stay well hydrated Routine orders from the assisted living facility were signed The patient should have her blood pressure checked a little bit more frequently since it was elevated today and we would ask that the facility check it every couple weeks for 3 months and then go back to their routine check after that.    Arrie Senate MD

## 2016-05-28 NOTE — Patient Instructions (Addendum)
Medicare Annual Wellness Visit  [facility] and the medical providers at [facility] strive to bring you the best medical care.  In doing so we not only want to address your current medical conditions and concerns but also to detect new conditions early and prevent illness, disease and health-related problems.    Medicare offers a yearly Wellness Visit which allows our clinical staff to assess your need for preventative services including immunizations, lifestyle education, counseling to decrease risk of preventable diseases and screening for fall risk and other medical concerns.    This visit is provided free of charge (no copay) for all Medicare recipients. The clinical pharmacists at [facility] have begun to conduct these Wellness Visits which will also include a thorough review of all your medications.    As you primary medical provider recommend that you make an appointment for your Annual Wellness Visit if you have not done so already this year.  You may set up this appointment before you leave today or you may call back [phone]) and schedule an appointment.  Please make sure when you call that you mention that you are scheduling your Annual Wellness Visit with the clinical pharmacist so that the appointment may be made for the proper length of time.     Continue current medications. Continue good therapeutic lifestyle changes which include good diet and exercise. Fall precautions discussed with patient. If an FOBT was given today- please return it to our front desk. If you are over 80 years old - you may need Prevnar 13 or the adult Pneumonia vaccine.  **Flu shots are available--- please call and schedule a FLU-CLINIC appointment**  After your visit with us today you will receive a survey in the mail or online from [employer] regarding your care with us. Please take a moment to fill this out. Your feedback is very  important to us as you can help us better understand your patient needs as well as improve your experience and satisfaction. WE CARE ABOUT YOU!!!   We'll call with the results of the chest x-ray in the lab work results as soon as they become available It's important that she get her flu shot in early October She should be encouraged to drink plenty of fluids and stay well hydrated Routine orders from the assisted living facility were signed The patient should have her blood pressure checked a little bit more frequently since it was elevated today and we would ask that the facility check it every couple weeks for 3 months and then go back to their routine check after that.

## 2016-05-29 ENCOUNTER — Encounter: Payer: Self-pay | Admitting: Family Medicine

## 2016-05-29 DIAGNOSIS — I700 Atherosclerosis of aorta: Secondary | ICD-10-CM | POA: Insufficient documentation

## 2016-05-29 LAB — CBC WITH DIFFERENTIAL/PLATELET
BASOS: 1 %
Basophils Absolute: 0 10*3/uL (ref 0.0–0.2)
EOS (ABSOLUTE): 0.2 10*3/uL (ref 0.0–0.4)
Eos: 3 %
HEMATOCRIT: 40.9 % (ref 34.0–46.6)
HEMOGLOBIN: 13.3 g/dL (ref 11.1–15.9)
IMMATURE GRANS (ABS): 0 10*3/uL (ref 0.0–0.1)
Immature Granulocytes: 0 %
LYMPHS: 40 %
Lymphocytes Absolute: 2.6 10*3/uL (ref 0.7–3.1)
MCH: 30.7 pg (ref 26.6–33.0)
MCHC: 32.5 g/dL (ref 31.5–35.7)
MCV: 95 fL (ref 79–97)
MONOCYTES: 12 %
Monocytes Absolute: 0.8 10*3/uL (ref 0.1–0.9)
NEUTROS ABS: 2.8 10*3/uL (ref 1.4–7.0)
Neutrophils: 44 %
Platelets: 286 10*3/uL (ref 150–379)
RBC: 4.33 x10E6/uL (ref 3.77–5.28)
RDW: 14.3 % (ref 12.3–15.4)
WBC: 6.4 10*3/uL (ref 3.4–10.8)

## 2016-05-29 LAB — LIPID PANEL
CHOLESTEROL TOTAL: 201 mg/dL — AB (ref 100–199)
Chol/HDL Ratio: 3.5 ratio units (ref 0.0–4.4)
HDL: 58 mg/dL (ref >39)
LDL CALC: 112 mg/dL — AB (ref 0–99)
Triglycerides: 156 mg/dL — ABNORMAL HIGH (ref 0–149)
VLDL CHOLESTEROL CAL: 31 mg/dL (ref 5–40)

## 2016-05-29 LAB — BMP8+EGFR
BUN/Creatinine Ratio: 19 (ref 12–28)
BUN: 18 mg/dL (ref 8–27)
CALCIUM: 9.3 mg/dL (ref 8.7–10.3)
CO2: 24 mmol/L (ref 18–29)
Chloride: 101 mmol/L (ref 96–106)
Creatinine, Ser: 0.95 mg/dL (ref 0.57–1.00)
GFR, EST AFRICAN AMERICAN: 63 mL/min/{1.73_m2} (ref >59)
GFR, EST NON AFRICAN AMERICAN: 54 mL/min/{1.73_m2} — AB (ref >59)
Glucose: 103 mg/dL — ABNORMAL HIGH (ref 65–99)
Potassium: 4.5 mmol/L (ref 3.5–5.2)
Sodium: 142 mmol/L (ref 134–144)

## 2016-05-29 LAB — HEPATIC FUNCTION PANEL
ALT: 11 IU/L (ref 0–32)
AST: 12 IU/L (ref 0–40)
Albumin: 4.4 g/dL (ref 3.5–4.7)
Alkaline Phosphatase: 63 IU/L (ref 39–117)
BILIRUBIN, DIRECT: 0.1 mg/dL (ref 0.00–0.40)
Bilirubin Total: 0.3 mg/dL (ref 0.0–1.2)
TOTAL PROTEIN: 6.8 g/dL (ref 6.0–8.5)

## 2016-05-29 LAB — VITAMIN D 25 HYDROXY (VIT D DEFICIENCY, FRACTURES): Vit D, 25-Hydroxy: 32.9 ng/mL (ref 30.0–100.0)

## 2016-06-25 DIAGNOSIS — H26492 Other secondary cataract, left eye: Secondary | ICD-10-CM | POA: Diagnosis not present

## 2016-06-25 DIAGNOSIS — H43812 Vitreous degeneration, left eye: Secondary | ICD-10-CM | POA: Diagnosis not present

## 2016-06-25 DIAGNOSIS — Z961 Presence of intraocular lens: Secondary | ICD-10-CM | POA: Diagnosis not present

## 2016-06-25 DIAGNOSIS — H3500 Unspecified background retinopathy: Secondary | ICD-10-CM | POA: Diagnosis not present

## 2016-09-28 ENCOUNTER — Encounter (INDEPENDENT_AMBULATORY_CARE_PROVIDER_SITE_OTHER): Payer: Self-pay

## 2016-09-28 ENCOUNTER — Ambulatory Visit (INDEPENDENT_AMBULATORY_CARE_PROVIDER_SITE_OTHER): Payer: Medicare HMO | Admitting: Family Medicine

## 2016-09-28 ENCOUNTER — Encounter: Payer: Self-pay | Admitting: Family Medicine

## 2016-09-28 VITALS — BP 124/64 | HR 49 | Temp 97.4°F | Ht 62.0 in | Wt 147.0 lb

## 2016-09-28 DIAGNOSIS — I10 Essential (primary) hypertension: Secondary | ICD-10-CM | POA: Diagnosis not present

## 2016-09-28 DIAGNOSIS — R413 Other amnesia: Secondary | ICD-10-CM | POA: Diagnosis not present

## 2016-09-28 DIAGNOSIS — F0150 Vascular dementia without behavioral disturbance: Secondary | ICD-10-CM

## 2016-09-28 DIAGNOSIS — E7800 Pure hypercholesterolemia, unspecified: Secondary | ICD-10-CM

## 2016-09-28 DIAGNOSIS — E559 Vitamin D deficiency, unspecified: Secondary | ICD-10-CM | POA: Diagnosis not present

## 2016-09-28 NOTE — Patient Instructions (Addendum)
Medicare Annual Wellness Visit  [facility] and the medical providers at [facility] strive to bring you the best medical care.  In doing so we not only want to address your current medical conditions and concerns but also to detect new conditions early and prevent illness, disease and health-related problems.    Medicare offers a yearly Wellness Visit which allows our clinical staff to assess your need for preventative services including immunizations, lifestyle education, counseling to decrease risk of preventable diseases and screening for fall risk and other medical concerns.    This visit is provided free of charge (no copay) for all Medicare recipients. The clinical pharmacists at [facility] have begun to conduct these Wellness Visits which will also include a thorough review of all your medications.    As you primary medical provider recommend that you make an appointment for your Annual Wellness Visit if you have not done so already this year.  You may set up this appointment before you leave today or you may call back [phone] and schedule an appointment.  Please make sure when you call that you mention that you are scheduling your Annual Wellness Visit with the clinical pharmacist so that the appointment may be made for the proper length of time.     Continue current medications. Continue good therapeutic lifestyle changes which include good diet and exercise. Fall precautions discussed with patient. If an FOBT was given today- please return it to our front desk. If you are over 81 years old - you may need Prevnar 13 or the adult Pneumonia vaccine.  **Flu shots are available--- please call and schedule a FLU-CLINIC appointment**  After your visit with us today you will receive a survey in the mail or online from [employer] regarding your care with us. Please take a moment to fill this out. Your feedback is very  important to us as you can help us better understand your patient needs as well as improve your experience and satisfaction. WE CARE ABOUT YOU!!!   We will get lab work on the patient and make sure that this is forwarded to the assisted living facility when it is returned

## 2016-09-28 NOTE — Progress Notes (Signed)
Subjective:    Patient ID: Vickie Hall, female    DOB: 1927/09/20, 83 y.o.   MRN: [id number]  HPI Pt here for follow up and management of chronic medical problems which includes hyperlipidemia. She is taking medications regularly. The patient is a resident at [facility] and is in the memory unit there. I have known this patient for several years and she has 2 sons and she was not able to live at home by herself anymore and was noted to have bizarre habits at home and not eating properly. She seems to adjusted to the location well has been taking good care of by the people at [facility]. She has no specific complaints today and the person that is bringing her does not know of anything that's of concern. She denies any chest pain shortness of breath trouble swallowing heartburn indigestion nausea vomiting diarrhea or blood in the stool. She is in the memory unit and she seems to been stable over the past several years and has not progressed with any worsening signs of Alzheimer's. This is a good thing. She knows my name and she knows her family's information. She interacts well with her people that live in her area. The patient has had her flu shot.  Patient Active Problem List   Diagnosis Date Noted  . Aortic atherosclerosis ([place]) 05/29/2016  . Osteoporosis, senile 12/16/2013  . GERD (gastroesophageal reflux disease) 11/11/2013  . HTN (hypertension) 11/11/2013  . Hyperlipidemia 11/11/2013  . Memory deficit 11/11/2013   Outpatient Encounter Prescriptions as of 09/28/2016  Medication Sig  . alendronate (FOSAMAX) 70 MG tablet Take 70 mg by mouth once a week. Take with a full glass of water on an empty stomach.  . ezetimibe (ZETIA) 10 MG tablet Take 10 mg by mouth daily.  . Memantine HCl-Donepezil HCl (NAMZARIC) 28-10 MG CP24 Take 1 tablet by mouth at bedtime.  . valsartan (DIOVAN) 160 MG tablet Take 160 mg by mouth daily.  . Vitamin D, Ergocalciferol, (DRISDOL) 50000  UNITS CAPS capsule Take 1 capsule (50,000 Units total) by mouth every 7 (seven) days.   No facility-administered encounter medications on file as of 09/28/2016.      Review of Systems  Constitutional: Negative.   HENT: Negative.   Eyes: Negative.   Respiratory: Negative.   Cardiovascular: Negative.   Gastrointestinal: Negative.   Endocrine: Negative.   Genitourinary: Negative.   Musculoskeletal: Negative.   Skin: Negative.   Allergic/Immunologic: Negative.   Neurological: Negative.   Hematological: Negative.   Psychiatric/Behavioral: Negative.        Objective:   Physical Exam  Constitutional: She is oriented to person, place, and time. She appears well-developed and well-nourished. No distress.  HENT:  Head: Normocephalic and atraumatic.  Right Ear: External ear normal.  Left Ear: External ear normal.  Nose: Nose normal.  Mouth/Throat: Oropharynx is clear and moist. No oropharyngeal exudate.  Slight nasal congestion  Eyes: Conjunctivae and EOM are normal. Pupils are equal, round, and reactive to light. Right eye exhibits no discharge. Left eye exhibits no discharge. No scleral icterus.  The patient does get periodic eye exams at the assisted living facility.  Neck: Normal range of motion. Neck supple. No thyromegaly present.  Cardiovascular: Normal rate, regular rhythm and normal heart sounds.   No murmur heard. There are decreased pedal pulses bilaterally and no edema  Pulmonary/Chest: Effort normal and breath sounds normal. No respiratory distress. She has no wheezes. She has no rales.  Clear anteriorly and posteriorly  Abdominal: Soft. Bowel sounds are normal. She exhibits no mass. There is no tenderness. There is no rebound and no guarding.  No abdominal tenderness masses or organ enlargement or bruits  Musculoskeletal: Normal range of motion. She exhibits no edema.  Lymphadenopathy:    She has no cervical adenopathy.  Neurological: She is alert and oriented to  person, place, and time. She has normal reflexes. No cranial nerve deficit.  Skin: Skin is warm and dry. No rash noted.  Psychiatric: She has a normal mood and affect. Her behavior is normal. Thought content normal.  The patient's cognitive abilities appeared to be stable with no worsening since the last visit.  Nursing note and vitals reviewed.  BP 124/64 (BP Location: Left Arm)   Pulse (!) 49   Temp 97.4 F (36.3 C) (Oral)   Ht _0  (1.575 m)   Wt 147 lb (66.7 kg)   BMI 26.89 kg/m         Assessment & Plan:  1. Essential hypertension -Continue current treatment - BMP8+EGFR - CBC with Differential/Platelet - Hepatic function panel  2. Vitamin D deficiency -Continue current treatment pending results of lab work - CBC with Differential/Platelet - VITAMIN D 25 Hydroxy (Vit-D Deficiency, Fractures)  3. Memory deficit -The patient's memory impairment and dementia appeared to be stable and we will continue with her current treatment of namzeric - CBC with Differential/Platelet  4. Pure hypercholesterolemia -Continue with current treatment pending results of lab work - CBC with Differential/Platelet - Lipid panel  5. Vascular dementia without behavioral disturbance -Continue with Aricept/Namenda and is much stimulation as possible in the environment that she is in  Patient Instructions                       Medicare Annual Wellness Visit  [facility] and the medical providers at [facility] strive to bring you the best medical care.  In doing so we not only want to address your current medical conditions and concerns but also to detect new conditions early and prevent illness, disease and health-related problems.    Medicare offers a yearly Wellness Visit which allows our clinical staff to assess your need for preventative services including immunizations, lifestyle education, counseling to decrease risk of preventable diseases and screening for fall  risk and other medical concerns.    This visit is provided free of charge (no copay) for all Medicare recipients. The clinical pharmacists at [facility] have begun to conduct these Wellness Visits which will also include a thorough review of all your medications.    As you primary medical provider recommend that you make an appointment for your Annual Wellness Visit if you have not done so already this year.  You may set up this appointment before you leave today or you may call back [phone]) and schedule an appointment.  Please make sure when you call that you mention that you are scheduling your Annual Wellness Visit with the clinical pharmacist so that the appointment may be made for the proper length of time.     Continue current medications. Continue good therapeutic lifestyle changes which include good diet and exercise. Fall precautions discussed with patient. If an FOBT was given today- please return it to our front desk. If you are over 22 years old - you may need Prevnar 63 or the adult Pneumonia vaccine.  **Flu shots are available--- please call and schedule a FLU-CLINIC  appointment**  After your visit with [place] today you will receive a survey in the mail or online from [employer] regarding your care with [place]. Please take a moment to fill this out. Your feedback is very important to [place] as you can help [place] better understand your patient needs as well as improve your experience and satisfaction. WE CARE ABOUT YOU!!!   We will get lab work on the patient and make sure that this is forwarded to the assisted living facility when it is returned  Arrie Senate MD

## 2016-09-29 LAB — CBC WITH DIFFERENTIAL/PLATELET
Basophils Absolute: 0 x10E3/uL (ref 0.0–0.2)
Basos: 0 %
EOS (ABSOLUTE): 0.2 x10E3/uL (ref 0.0–0.4)
Eos: 3 %
Hematocrit: 41.4 % (ref 34.0–46.6)
Hemoglobin: 13.3 g/dL (ref 11.1–15.9)
Immature Grans (Abs): 0 x10E3/uL (ref 0.0–0.1)
Immature Granulocytes: 0 %
Lymphocytes Absolute: 2.4 x10E3/uL (ref 0.7–3.1)
Lymphs: 36 %
MCH: 31 pg (ref 26.6–33.0)
MCHC: 32.1 g/dL (ref 31.5–35.7)
MCV: 97 fL (ref 79–97)
Monocytes Absolute: 0.7 x10E3/uL (ref 0.1–0.9)
Monocytes: 10 %
Neutrophils Absolute: 3.4 x10E3/uL (ref 1.4–7.0)
Neutrophils: 51 %
Platelets: 246 x10E3/uL (ref 150–379)
RBC: 4.29 x10E6/uL (ref 3.77–5.28)
RDW: 13.9 % (ref 12.3–15.4)
WBC: 6.8 x10E3/uL (ref 3.4–10.8)

## 2016-09-29 LAB — LIPID PANEL
CHOLESTEROL TOTAL: 186 mg/dL (ref 100–199)
Chol/HDL Ratio: 4.5 ratio units — ABNORMAL HIGH (ref 0.0–4.4)
HDL: 41 mg/dL (ref >39)
LDL CALC: 109 mg/dL — AB (ref 0–99)
TRIGLYCERIDES: 180 mg/dL — AB (ref 0–149)
VLDL Cholesterol Cal: 36 mg/dL (ref 5–40)

## 2016-09-29 LAB — HEPATIC FUNCTION PANEL
ALK PHOS: 63 IU/L (ref 39–117)
ALT: 13 IU/L (ref 0–32)
AST: 16 IU/L (ref 0–40)
Albumin: 4.2 g/dL (ref 3.5–4.7)
Bilirubin Total: 0.3 mg/dL (ref 0.0–1.2)
Bilirubin, Direct: 0.09 mg/dL (ref 0.00–0.40)
TOTAL PROTEIN: 6.7 g/dL (ref 6.0–8.5)

## 2016-09-29 LAB — VITAMIN D 25 HYDROXY (VIT D DEFICIENCY, FRACTURES): Vit D, 25-Hydroxy: 34.3 ng/mL (ref 30.0–100.0)

## 2016-09-29 LAB — BMP8+EGFR
BUN/Creatinine Ratio: 30 — ABNORMAL HIGH (ref 12–28)
BUN: 21 mg/dL (ref 8–27)
CO2: 25 mmol/L (ref 18–29)
Calcium: 9.1 mg/dL (ref 8.7–10.3)
Chloride: 102 mmol/L (ref 96–106)
Creatinine, Ser: 0.71 mg/dL (ref 0.57–1.00)
GFR calc Af Amer: 89 mL/min/1.73
GFR calc non Af Amer: 77 mL/min/1.73
Glucose: 118 mg/dL — ABNORMAL HIGH (ref 65–99)
Potassium: 4.1 mmol/L (ref 3.5–5.2)
Sodium: 143 mmol/L (ref 134–144)

## 2016-10-28 ENCOUNTER — Inpatient Hospital Stay (HOSPITAL_COMMUNITY)
Admission: EM | Admit: 2016-10-28 | Discharge: 2016-11-05 | DRG: 481 | Disposition: A | Discharge status: Home Health | Payer: Medicare HMO | Attending: Internal Medicine | Admitting: Internal Medicine

## 2016-10-28 ENCOUNTER — Emergency Department (HOSPITAL_COMMUNITY): Payer: Medicare HMO

## 2016-10-28 ENCOUNTER — Encounter (HOSPITAL_COMMUNITY): Payer: Self-pay | Admitting: Emergency Medicine

## 2016-10-28 DIAGNOSIS — S72142A Displaced intertrochanteric fracture of left femur, initial encounter for closed fracture: Secondary | ICD-10-CM | POA: Diagnosis not present

## 2016-10-28 DIAGNOSIS — I739 Peripheral vascular disease, unspecified: Secondary | ICD-10-CM | POA: Diagnosis not present

## 2016-10-28 DIAGNOSIS — S0990XA Unspecified injury of head, initial encounter: Secondary | ICD-10-CM

## 2016-10-28 DIAGNOSIS — R001 Bradycardia, unspecified: Secondary | ICD-10-CM | POA: Diagnosis not present

## 2016-10-28 DIAGNOSIS — M25552 Pain in left hip: Secondary | ICD-10-CM | POA: Diagnosis not present

## 2016-10-28 DIAGNOSIS — W19XXXA Unspecified fall, initial encounter: Secondary | ICD-10-CM

## 2016-10-28 DIAGNOSIS — Z66 Do not resuscitate: Secondary | ICD-10-CM | POA: Diagnosis present

## 2016-10-28 DIAGNOSIS — Y92128 Other place in nursing home as the place of occurrence of the external cause: Secondary | ICD-10-CM

## 2016-10-28 DIAGNOSIS — R0902 Hypoxemia: Secondary | ICD-10-CM | POA: Diagnosis not present

## 2016-10-28 DIAGNOSIS — Z90710 Acquired absence of both cervix and uterus: Secondary | ICD-10-CM | POA: Diagnosis not present

## 2016-10-28 DIAGNOSIS — S52615A Nondisplaced fracture of left ulna styloid process, initial encounter for closed fracture: Secondary | ICD-10-CM | POA: Diagnosis present

## 2016-10-28 DIAGNOSIS — D72829 Elevated white blood cell count, unspecified: Secondary | ICD-10-CM | POA: Diagnosis present

## 2016-10-28 DIAGNOSIS — G309 Alzheimer's disease, unspecified: Secondary | ICD-10-CM | POA: Diagnosis present

## 2016-10-28 DIAGNOSIS — D649 Anemia, unspecified: Secondary | ICD-10-CM | POA: Diagnosis not present

## 2016-10-28 DIAGNOSIS — K5900 Constipation, unspecified: Secondary | ICD-10-CM | POA: Diagnosis not present

## 2016-10-28 DIAGNOSIS — S52592A Other fractures of lower end of left radius, initial encounter for closed fracture: Secondary | ICD-10-CM | POA: Diagnosis not present

## 2016-10-28 DIAGNOSIS — E785 Hyperlipidemia, unspecified: Secondary | ICD-10-CM | POA: Diagnosis present

## 2016-10-28 DIAGNOSIS — K219 Gastro-esophageal reflux disease without esophagitis: Secondary | ICD-10-CM | POA: Diagnosis not present

## 2016-10-28 DIAGNOSIS — S0993XA Unspecified injury of face, initial encounter: Secondary | ICD-10-CM | POA: Diagnosis not present

## 2016-10-28 DIAGNOSIS — R339 Retention of urine, unspecified: Secondary | ICD-10-CM | POA: Diagnosis not present

## 2016-10-28 DIAGNOSIS — S72002A Fracture of unspecified part of neck of left femur, initial encounter for closed fracture: Secondary | ICD-10-CM

## 2016-10-28 DIAGNOSIS — S62102D Fracture of unspecified carpal bone, left wrist, subsequent encounter for fracture with routine healing: Secondary | ICD-10-CM | POA: Diagnosis not present

## 2016-10-28 DIAGNOSIS — S52502A Unspecified fracture of the lower end of left radius, initial encounter for closed fracture: Secondary | ICD-10-CM | POA: Diagnosis not present

## 2016-10-28 DIAGNOSIS — S199XXA Unspecified injury of neck, initial encounter: Secondary | ICD-10-CM | POA: Diagnosis not present

## 2016-10-28 DIAGNOSIS — W1839XA Other fall on same level, initial encounter: Secondary | ICD-10-CM | POA: Diagnosis not present

## 2016-10-28 DIAGNOSIS — S0083XA Contusion of other part of head, initial encounter: Secondary | ICD-10-CM

## 2016-10-28 DIAGNOSIS — S72002D Fracture of unspecified part of neck of left femur, subsequent encounter for closed fracture with routine healing: Secondary | ICD-10-CM | POA: Diagnosis not present

## 2016-10-28 DIAGNOSIS — I10 Essential (primary) hypertension: Secondary | ICD-10-CM | POA: Diagnosis present

## 2016-10-28 DIAGNOSIS — N179 Acute kidney failure, unspecified: Secondary | ICD-10-CM | POA: Diagnosis not present

## 2016-10-28 DIAGNOSIS — R55 Syncope and collapse: Secondary | ICD-10-CM | POA: Diagnosis not present

## 2016-10-28 DIAGNOSIS — F0280 Dementia in other diseases classified elsewhere without behavioral disturbance: Secondary | ICD-10-CM | POA: Diagnosis present

## 2016-10-28 DIAGNOSIS — M79602 Pain in left arm: Secondary | ICD-10-CM | POA: Diagnosis not present

## 2016-10-28 DIAGNOSIS — Z09 Encounter for follow-up examination after completed treatment for conditions other than malignant neoplasm: Secondary | ICD-10-CM

## 2016-10-28 DIAGNOSIS — S72102A Unspecified trochanteric fracture of left femur, initial encounter for closed fracture: Secondary | ICD-10-CM | POA: Diagnosis present

## 2016-10-28 DIAGNOSIS — S52612A Displaced fracture of left ulna styloid process, initial encounter for closed fracture: Secondary | ICD-10-CM

## 2016-10-28 DIAGNOSIS — R109 Unspecified abdominal pain: Secondary | ICD-10-CM | POA: Diagnosis not present

## 2016-10-28 DIAGNOSIS — S52572A Other intraarticular fracture of lower end of left radius, initial encounter for closed fracture: Secondary | ICD-10-CM | POA: Diagnosis present

## 2016-10-28 DIAGNOSIS — S5290XA Unspecified fracture of unspecified forearm, initial encounter for closed fracture: Secondary | ICD-10-CM | POA: Diagnosis not present

## 2016-10-28 DIAGNOSIS — S72009A Fracture of unspecified part of neck of unspecified femur, initial encounter for closed fracture: Secondary | ICD-10-CM

## 2016-10-28 DIAGNOSIS — R197 Diarrhea, unspecified: Secondary | ICD-10-CM

## 2016-10-28 DIAGNOSIS — S62102A Fracture of unspecified carpal bone, left wrist, initial encounter for closed fracture: Secondary | ICD-10-CM | POA: Diagnosis not present

## 2016-10-28 DIAGNOSIS — W1830XA Fall on same level, unspecified, initial encounter: Secondary | ICD-10-CM | POA: Diagnosis present

## 2016-10-28 DIAGNOSIS — T148XXA Other injury of unspecified body region, initial encounter: Secondary | ICD-10-CM

## 2016-10-28 DIAGNOSIS — E559 Vitamin D deficiency, unspecified: Secondary | ICD-10-CM | POA: Diagnosis present

## 2016-10-28 DIAGNOSIS — Z79899 Other long term (current) drug therapy: Secondary | ICD-10-CM | POA: Diagnosis not present

## 2016-10-28 DIAGNOSIS — D62 Acute posthemorrhagic anemia: Secondary | ICD-10-CM | POA: Diagnosis not present

## 2016-10-28 DIAGNOSIS — S62102S Fracture of unspecified carpal bone, left wrist, sequela: Secondary | ICD-10-CM | POA: Diagnosis not present

## 2016-10-28 DIAGNOSIS — S098XXA Other specified injuries of head, initial encounter: Secondary | ICD-10-CM | POA: Diagnosis not present

## 2016-10-28 DIAGNOSIS — S92153A Displaced avulsion fracture (chip fracture) of unspecified talus, initial encounter for closed fracture: Secondary | ICD-10-CM | POA: Diagnosis not present

## 2016-10-28 DIAGNOSIS — M810 Age-related osteoporosis without current pathological fracture: Secondary | ICD-10-CM | POA: Diagnosis present

## 2016-10-28 DIAGNOSIS — S064X0A Epidural hemorrhage without loss of consciousness, initial encounter: Secondary | ICD-10-CM | POA: Diagnosis not present

## 2016-10-28 DIAGNOSIS — M79605 Pain in left leg: Secondary | ICD-10-CM | POA: Diagnosis not present

## 2016-10-28 DIAGNOSIS — S8992XA Unspecified injury of left lower leg, initial encounter: Secondary | ICD-10-CM | POA: Diagnosis not present

## 2016-10-28 DIAGNOSIS — S299XXA Unspecified injury of thorax, initial encounter: Secondary | ICD-10-CM | POA: Diagnosis not present

## 2016-10-28 LAB — CBC WITH DIFFERENTIAL/PLATELET
BASOS PCT: 0 %
Basophils Absolute: 0 10*3/uL (ref 0.0–0.1)
Eosinophils Absolute: 0 10*3/uL (ref 0.0–0.7)
Eosinophils Relative: 0 %
HEMATOCRIT: 41.3 % (ref 36.0–46.0)
Hemoglobin: 13.5 g/dL (ref 12.0–15.0)
Lymphocytes Relative: 9 %
Lymphs Abs: 1 10*3/uL (ref 0.7–4.0)
MCH: 31.6 pg (ref 26.0–34.0)
MCHC: 32.7 g/dL (ref 30.0–36.0)
MCV: 96.7 fL (ref 78.0–100.0)
MONO ABS: 1.2 10*3/uL — AB (ref 0.1–1.0)
MONOS PCT: 10 %
NEUTROS ABS: 9.7 10*3/uL — AB (ref 1.7–7.7)
Neutrophils Relative %: 81 %
Platelets: 232 10*3/uL (ref 150–400)
RBC: 4.27 MIL/uL (ref 3.87–5.11)
RDW: 14 % (ref 11.5–15.5)
WBC: 12 10*3/uL — ABNORMAL HIGH (ref 4.0–10.5)

## 2016-10-28 LAB — BASIC METABOLIC PANEL
Anion gap: 9 (ref 5–15)
BUN: 12 mg/dL (ref 6–20)
CALCIUM: 8.6 mg/dL — AB (ref 8.9–10.3)
CO2: 25 mmol/L (ref 22–32)
CREATININE: 0.74 mg/dL (ref 0.44–1.00)
Chloride: 99 mmol/L — ABNORMAL LOW (ref 101–111)
GFR calc non Af Amer: >60 mL/min (ref >60)
GLUCOSE: 155 mg/dL — AB (ref 65–99)
Potassium: 4.1 mmol/L (ref 3.5–5.1)
Sodium: 133 mmol/L — ABNORMAL LOW (ref 135–145)

## 2016-10-28 LAB — TSH: TSH: 1.627 u[IU]/mL (ref 0.350–4.500)

## 2016-10-28 MED ORDER — SODIUM CHLORIDE 0.9 % IV SOLN: INTRAVENOUS | Status: DC

## 2016-10-28 MED ORDER — IRBESARTAN 150 MG PO TABS
150.0000 mg | ORAL_TABLET | Freq: Every day | ORAL | Status: DC
  Administered 2016-10-29 – 2016-11-02 (×4): 150 mg via ORAL
  Filled 2016-10-28 (×6): qty 1

## 2016-10-28 MED ORDER — ONDANSETRON HCL 4 MG/2ML IJ SOLN: 4.0000 mg | INTRAMUSCULAR | Status: DC | PRN

## 2016-10-28 MED ORDER — FENTANYL CITRATE (PF) 100 MCG/2ML IJ SOLN: 25.0000 ug | INTRAMUSCULAR | Status: DC | PRN

## 2016-10-28 MED ORDER — ENOXAPARIN SODIUM 40 MG/0.4ML ~~LOC~~ SOLN
40.0000 mg | SUBCUTANEOUS | Status: DC
  Administered 2016-10-28: 40 mg via SUBCUTANEOUS
  Filled 2016-10-28: qty 0.4

## 2016-10-28 MED ORDER — CHLORHEXIDINE GLUCONATE 4 % EX LIQD: 60.0000 mL | Freq: Once | CUTANEOUS | Status: DC

## 2016-10-28 MED ORDER — ALBUTEROL SULFATE (2.5 MG/3ML) 0.083% IN NEBU: 2.5000 mg | INHALATION_SOLUTION | RESPIRATORY_TRACT | Status: DC | PRN

## 2016-10-28 MED ORDER — ACETAMINOPHEN 650 MG RE SUPP: 650.0000 mg | Freq: Four times a day (QID) | RECTAL | Status: DC | PRN

## 2016-10-28 MED ORDER — ONDANSETRON HCL 4 MG PO TABS
4.0000 mg | ORAL_TABLET | Freq: Four times a day (QID) | ORAL | Status: DC | PRN
  Administered 2016-11-03: 4 mg via ORAL
  Filled 2016-10-28: qty 1

## 2016-10-28 MED ORDER — MEMANTINE HCL ER 28 MG PO CP24
28.0000 mg | ORAL_CAPSULE | Freq: Every day | ORAL | Status: DC
  Administered 2016-10-28 – 2016-11-04 (×8): 28 mg via ORAL
  Filled 2016-10-28 (×9): qty 1

## 2016-10-28 MED ORDER — EZETIMIBE 10 MG PO TABS
10.0000 mg | ORAL_TABLET | Freq: Every day | ORAL | Status: DC
Start: 2016-10-29 — End: 2016-11-05
  Administered 2016-10-29 – 2016-11-05 (×8): 10 mg via ORAL
  Filled 2016-10-28 (×9): qty 1

## 2016-10-28 MED ORDER — DONEPEZIL HCL 10 MG PO TABS
10.0000 mg | ORAL_TABLET | Freq: Every day | ORAL | Status: DC
  Administered 2016-10-28 – 2016-11-04 (×8): 10 mg via ORAL
  Filled 2016-10-28 (×8): qty 1

## 2016-10-28 MED ORDER — KETOROLAC TROMETHAMINE 15 MG/ML IJ SOLN
7.5000 mg | Freq: Three times a day (TID) | INTRAMUSCULAR | Status: AC | PRN
  Administered 2016-10-28 – 2016-10-29 (×2): 7.5 mg via INTRAVENOUS
  Filled 2016-10-28 (×2): qty 1

## 2016-10-28 MED ORDER — OXYCODONE HCL 5 MG PO TABS
5.0000 mg | ORAL_TABLET | Freq: Four times a day (QID) | ORAL | Status: DC | PRN
  Administered 2016-10-30 – 2016-11-04 (×6): 5 mg via ORAL
  Filled 2016-10-28 (×6): qty 1

## 2016-10-28 MED ORDER — ACETAMINOPHEN 325 MG PO TABS
650.0000 mg | ORAL_TABLET | Freq: Four times a day (QID) | ORAL | Status: DC | PRN
  Administered 2016-10-31 – 2016-11-03 (×2): 650 mg via ORAL
  Filled 2016-10-28 (×2): qty 2

## 2016-10-28 MED ORDER — SODIUM CHLORIDE 0.9 % IV SOLN: INTRAVENOUS | Status: AC

## 2016-10-28 MED ORDER — CEFAZOLIN SODIUM-DEXTROSE 2-4 GM/100ML-% IV SOLN
2.0000 g | INTRAVENOUS | Status: AC
  Administered 2016-10-29: 2 g via INTRAVENOUS
  Filled 2016-10-28 (×2): qty 100

## 2016-10-28 MED ORDER — ONDANSETRON HCL 4 MG/2ML IJ SOLN: 4.0000 mg | Freq: Four times a day (QID) | INTRAMUSCULAR | Status: DC | PRN

## 2016-10-28 MED ORDER — POVIDONE-IODINE 10 % EX SWAB: 2.0000 "application " | Freq: Once | CUTANEOUS | Status: DC

## 2016-10-28 MED ORDER — MEMANTINE HCL-DONEPEZIL HCL ER 28-10 MG PO CP24: 1.0000 | ORAL_CAPSULE | Freq: Every day | ORAL | Status: DC

## 2016-10-28 NOTE — ED Triage Notes (Signed)
Per EMS pt fell walking to the cafeteria has L head contusion, L fx arm, L hip fracture, given 25mcg fentanyl, 15mg  Toradol, needs consult to orthopedic surgery, head was scanned at previous hospital

## 2016-10-28 NOTE — ED Notes (Signed)
Attempted report x1. 

## 2016-10-28 NOTE — ED Provider Notes (Signed)
[facility]-EMERGENCY DEPT Provider Note   CSN: [id number] Arrival date & time: 10/28/16  1611     History   Chief Complaint Chief Complaint  Patient presents with  . Fall    HPI Vickie MorrisonHelen M Terrilee Hall is a 83 y.o. female.  The history is provided by the EMS personnel.  Hip Pain  This is a new problem. The current episode started 6 to 12 hours ago. The problem occurs constantly. The problem has not changed since onset.Pertinent negatives include no chest pain, no abdominal pain and no shortness of breath. Nothing aggravates the symptoms. Nothing relieves the symptoms. She has tried nothing for the symptoms.    Past Medical History:  Diagnosis Date  . GERD (gastroesophageal reflux disease)   . Hyperlipidemia   . Hypertension   . Memory deficit   . Osteoporosis     Patient Active Problem List   Diagnosis Date Noted  . Aortic atherosclerosis ([facility]) 05/29/2016  . Osteoporosis, senile 12/16/2013  . GERD (gastroesophageal reflux disease) 11/11/2013  . HTN (hypertension) 11/11/2013  . Hyperlipidemia 11/11/2013  . Memory deficit 11/11/2013    Past Surgical History:  Procedure Laterality Date  . ABDOMINAL HYSTERECTOMY    . EYE SURGERY      OB History    No data available       Home Medications    Prior to Admission medications   Medication Sig Start Date End Date Taking? Authorizing Provider  alendronate (FOSAMAX) 70 MG tablet Take 70 mg by mouth once a week. Take with a full glass of water on an empty stomach.    Historical Provider, MD  ezetimibe (ZETIA) 10 MG tablet Take 10 mg by mouth daily.    Historical Provider, MD  Memantine HCl-Donepezil HCl (NAMZARIC) 28-10 MG CP24 Take 1 tablet by mouth at bedtime. 07/07/14   Ernestina Pennaonald W Moore, MD  valsartan (DIOVAN) 160 MG tablet Take 160 mg by mouth daily.    Historical Provider, MD  Vitamin D, Ergocalciferol, (DRISDOL) 50000 UNITS CAPS capsule Take 1 capsule (50,000 Units total) by mouth every 7 (seven) days. 07/08/14   Ernestina Pennaonald W Moore,  MD    Family History No family history on file.  Social History Social History  Substance Use Topics  . Smoking status: Never Smoker  . Smokeless tobacco: Never Used  . Alcohol use No     Allergies   Actonel [risedronate sodium] and Lipitor [atorvastatin]   Review of Systems Review of Systems  Respiratory: Negative for shortness of breath.   Cardiovascular: Negative for chest pain.  Gastrointestinal: Negative for abdominal pain.  All other systems reviewed and are negative.    Physical Exam Updated Vital Signs SpO2 97%   Physical Exam  Constitutional: She appears well-developed and well-nourished. No distress.  HENT:  Head: Normocephalic.  Nose: Nose normal.  Eyes: Conjunctivae are normal.  Neck: Neck supple. No tracheal deviation present.  Cardiovascular: Normal rate and regular rhythm.   Pulmonary/Chest: Effort normal. No respiratory distress.  Abdominal: Soft. She exhibits no distension.  Musculoskeletal:       Left wrist: She exhibits tenderness (distal radius).       Left hip: She exhibits tenderness, bony tenderness and deformity (with shortening and external rotation).  Neurological: She is alert. She is disoriented (at baseline).  Skin: Skin is warm and dry.  Psychiatric: She has a normal mood and affect.     ED Treatments / Results  Labs (all labs ordered are listed, but only abnormal results are displayed) Labs  Reviewed  CBC WITH DIFFERENTIAL/PLATELET - Abnormal; Notable for the following:       Result Value   WBC 12.0 (*)    Neutro Abs 9.7 (*)    Monocytes Absolute 1.2 (*)    All other components within normal limits  BASIC METABOLIC PANEL - Abnormal; Notable for the following:    Sodium 133 (*)    Chloride 99 (*)    Glucose, Bld 155 (*)    Calcium 8.6 (*)    All other components within normal limits  TSH  URINALYSIS, ROUTINE W REFLEX MICROSCOPIC    EKG  EKG Interpretation None       Radiology Dg Chest 1 View  Result Date:  10/28/2016 CLINICAL DATA:  Fall, hip fracture EXAM: CHEST 1 VIEW COMPARISON:  10/28/2016 at 1052 hours FINDINGS: Lungs are clear.  No pleural effusion or pneumothorax. The heart is normal in size. Thoracic aortic atherosclerosis. IMPRESSION: No evidence of acute cardiopulmonary disease. Thoracic aortic atherosclerosis. Electronically Signed   By: Charline Bills M.D.   On: 10/28/2016 17:38   Dg Wrist Complete Left  Result Date: 10/28/2016 CLINICAL DATA:  Status post fall, interval cast placement EXAM: LEFT WRIST - COMPLETE 3+ VIEW COMPARISON:  Same day FINDINGS: Comminuted distal radial metaphysis fracture with a fracture cleft extending to the radiocarpal joint surface. No significant change in the overall alignment. Nondisplaced ulnar styloid process fracture. Severe osteoarthritis of the first [facility] joint. Mild osteoarthritis of the scaphotrapeziotrapezoid joint. IMPRESSION: Comminuted distal radial metaphysis fracture with a fracture cleft extending to the radiocarpal joint surface. No significant change in the overall alignment. Nondisplaced ulnar styloid process fracture. Electronically Signed   By: Elige Ko   On: 10/28/2016 17:42   Dg Hip Unilat W Or Wo Pelvis 2-3 Views Left  Result Date: 10/28/2016 CLINICAL DATA:  Status post fall.  Left hip pain EXAM: DG HIP (WITH OR WITHOUT PELVIS) 2-3V LEFT COMPARISON:  None. FINDINGS: Severe osteopenia. Comminuted left intertrochanteric fracture with mild displacement. No other acute fracture or dislocation. No aggressive osseous lesion. Mild osteoarthritis of bilateral sacroiliac joints. IMPRESSION: Acute comminuted left intertrochanteric fracture. Electronically Signed   By: Elige Ko   On: 10/28/2016 17:37    Procedures Procedures (including critical care time)  SPLINT APPLICATION Date/Time: 5:04 PM Authorized by: Lyndal Pulley Consent: Verbal consent obtained. Risks and benefits: risks, benefits and alternatives were discussed Consent given  by: patient Splint applied by: orthopedic technician Location details: left forearm Splint type: sugar tong Supplies used: ortho glass Post-procedure: The splinted body part was neurovascularly unchanged following the procedure. Patient tolerance: Patient tolerated the procedure well with no immediate complications.    Medications Ordered in ED Medications  fentaNYL (SUBLIMAZE) injection 25 mcg (not administered)  ondansetron (ZOFRAN) injection 4 mg (not administered)  0.9 %  sodium chloride infusion (not administered)    Initial Impression / Assessment and Plan / ED Course  I have reviewed the triage vital signs and the nursing notes.  Pertinent labs & imaging results that were available during my care of the patient were reviewed by me and considered in my medical decision making (see chart for details).     83 y.o. female presents after fall from standing onto left hip, exam concerning for fracture with shortening and point tenderness over hip. HDS, no other signs of significant trauma. Received in transfer from OSH with CT head, c-spine, A/P and plain films of left forearm demonstrating distal minimally displaced impacted distal radius fx with ulnar styloid  fx as only concomitant injury. Orthopedics consulted, admit to medicine pending surgical management.    Final Clinical Impressions(s) / ED Diagnoses   Final diagnoses:  Fall, initial encounter  Contusion of face, initial encounter  Closed intertrochanteric fracture of hip, left, initial encounter (HCC)  Closed fracture of distal end of left radius, unspecified fracture morphology, initial encounter  Traumatic closed fracture of ulnar styloid with minimal displacement, left, initial encounter    New Prescriptions New Prescriptions   No medications on file     Lyndal Pulley, MD 10/29/16 0105

## 2016-10-28 NOTE — H&P (Addendum)
History and Physical    Vickie Hall ZOX:[id number] DOB: 1928-05-19 DOA: 10/28/2016   PCP: Rudi Heap, MD    Patient coming from: [facility], memory care unit> [facility] ED  Chief Complaint: Left hip and wrist pain after fall at ALF.  HPI: Vickie Hall is a 83 y.o. female, resident of [address], ambulate short distances in the ALF without the help of a cane or a walker, dependent on activities of daily living, PMH of fairly advanced dementia, HTN, HLD, vitamin D deficiency, osteoporosis presented to the ED following a mechanical fall at the facility followed by left-sided hip and wrist pain. Patient is unable to provide any history secondary to her dementia. History was obtained from patient's son at bedside but he does not know much surrounding the event because he was not there. Not much details in the transfer note from [facility] ED. As per son's report, the facility called him at approximately 9:30 AM today to inform him that she had fallen at the facility while she was walking from her room to the breakfast room. No further details surrounding the event itself. They informed him that she had been transferred to [facility] ED. EDP then discussed her care with the orthopedic M.D. on call at [facility] who accepted patient in transfer and patient was directly sent to [facility] ED for further evaluation and management. Patient is pleasantly confused and denies any complaints. She denies left upper extremity pain. She does complain of left hip pain on movement. She denies headache, chest pain or dyspnea. No visual symptoms reported.  ED Course: Lab work done at outside hospital were unremarkable. Imaging study reports are as below. EDP consulted [facility] for admission. [facility] discussed with accepting orthopedic physician who indicates that she will be for surgery on 10/29/16.  Review of Systems:  All other systems reviewed and  apart from HPI, are negative. As per son, no history of chest pain, dyspnea, palpitations, dizziness or lightheadedness.  Past Medical History:  Diagnosis Date  . GERD (gastroesophageal reflux disease)   . Hyperlipidemia   . Hypertension   . Memory deficit   . Osteoporosis     Past Surgical History:  Procedure Laterality Date  . ABDOMINAL HYSTERECTOMY    . EYE SURGERY     Social history  reports that she has never smoked. She has never used smokeless tobacco. She reports that she does not drink alcohol or use drugs.  Allergies  Allergen Reactions  . Actonel [Risedronate Sodium] Other (See Comments)  . Lipitor [Atorvastatin] Other (See Comments)    Family History  Problem Relation Age of Onset  . Family history unknown: Yes     Prior to Admission medications   Medication Sig Start Date End Date Taking? Authorizing Provider  alendronate (FOSAMAX) 70 MG tablet Take 70 mg by mouth once a week. Take with a full glass of water on an empty stomach.    Historical Provider, MD  ezetimibe (ZETIA) 10 MG tablet Take 10 mg by mouth daily.    Historical Provider, MD  Memantine HCl-Donepezil HCl (NAMZARIC) 28-10 MG CP24 Take 1 tablet by mouth at bedtime. 07/07/14   Ernestina Penna, MD  valsartan (DIOVAN) 160 MG tablet Take 160 mg by mouth daily.    Historical Provider, MD  Vitamin D, Ergocalciferol, (DRISDOL) 50000 UNITS CAPS capsule Take 1 capsule (50,000 Units total) by mouth every 7 (seven) days. 07/08/14   Ernestina Penna,  MD    Physical Exam: Vitals:   10/28/16 1614 10/28/16 1618 10/28/16 1620 10/28/16 1630  BP:  141/64  147/65  Pulse:   (!) 56 (!) 56  Resp:   20 21  SpO2: 97%  95% 92%      Constitutional: Pleasant elderly female lying comfortably supine in the gurney in the ED. Eyes: PERTLA and conjunctivae normal. Left periorbital ecchymosis but able to open her eyes. ENMT: Mucous membranes are slightly dry. Posterior pharynx clear of any exudate or lesions. Normal  dentition.  Neck: normal, supple, no masses, no thyromegaly Respiratory: clear to auscultation bilaterally, no wheezing, no crackles. Normal respiratory effort. No accessory muscle use.  Cardiovascular: S1 & S2 heard, regular rate and rhythm, no murmurs / rubs / gallops. No extremity edema. 2+ pedal pulses. No carotid bruits.  Abdomen: No distension, no tenderness, no masses palpated. No hepatosplenomegaly. Bowel sounds normal. Foley catheter in place. This was placed at outside facility. Musculoskeletal: no clubbing / cyanosis. Left forearm in a splint. Able to move her fingers well. Well-perfused. Symmetric 5 x 5 power in all limbs except left lower extremity power assessment limited by left hip pain. Left lower extremity shortened and externally rotated. Symmetric pulses in lower extremities. Skin: no rashes, lesions, ulcers. No induration Neurologic: CN 2-12 grossly intact. Sensation intact, DTR normal.  Psychiatric: Impaired judgment and insight. Alert and oriented x only to self. Normal mood.     Labs on Admission: I have personally reviewed following labs and imaging studies   CBC: Hemoglobin 13.3, hematocrit 40.8, WBC 17.7, platelets 222.  CMP: Total protein 6.4, albumin 3.96, ALT 13, AST 15.8, alkaline phosphatase 50, sodium 139, potassium 4.2, chloride 102, bicarbonate 22.2, BUN 13, creatinine: 0.69, calcium 8.4, glucose 155  Troponin T <0.01   Urine microscopy: Clear, specific gravity 1.020, pH 6, negative for protein, glucose, ketone, bilirubin, trace blood, negative nitrites, 0-5 white blood cells, 0-5 RBCs and few bacteria.  Left hip x-ray with pelvis at outside hospital: Impression: Left intertrochanteric femur fracture with lesser trochanteric avulsion  Left knee x-ray at outside hospital: No fracture or dislocation.   CT abdomen and pelvis without contrast done at outside hospital: Impression: 1. Acute intertrochanteric left femur fracture with avulsion of the lesser  trochanter.. Left colonic diverticulosis without diverticulitis. 3. Atherosclerotic calcification of the aorta.  Left wrist x-ray done at outside hospital: Impression: 1. Mildly impacted and culminated distal radial matter physis fracture with a fracture cleft involving the articular surface of the radiocarpal joint. 2. Nondisplaced fracture of the ulnar styloid process.  Chest x-ray done at outside hospital: Impression: Mild cardiomegaly. No active disease.  CT head without contrast and CT cervical spine without contrast done at outside hospital: Impression: 1. No acute intracranial pathology. 2 no acute osseus injury to the cervical spine. 3 cervical spine spondylosis for. Bilateral carotid artery atherosclerosis  EKG done at outside hospital, personally reviewed: Sinus bradycardia at 45 bpm, normal axis, T-wave inversion in lateral leads otherwise no acute changes. QTC 411 ms.  Radiological Exams on Admission: Dg Chest 1 View  Result Date: 10/28/2016 CLINICAL DATA:  Fall, hip fracture EXAM: CHEST 1 VIEW COMPARISON:  10/28/2016 at 1052 hours FINDINGS: Lungs are clear.  No pleural effusion or pneumothorax. The heart is normal in size. Thoracic aortic atherosclerosis. IMPRESSION: No evidence of acute cardiopulmonary disease. Thoracic aortic atherosclerosis. Electronically Signed   By: Charline BillsSriyesh  Krishnan M.D.   On: 10/28/2016 17:38   Dg Wrist Complete Left  Result Date: 10/28/2016  CLINICAL DATA:  Status post fall, interval cast placement EXAM: LEFT WRIST - COMPLETE 3+ VIEW COMPARISON:  Same day FINDINGS: Comminuted distal radial metaphysis fracture with a fracture cleft extending to the radiocarpal joint surface. No significant change in the overall alignment. Nondisplaced ulnar styloid process fracture. Severe osteoarthritis of the first [facility] joint. Mild osteoarthritis of the scaphotrapeziotrapezoid joint. IMPRESSION: Comminuted distal radial metaphysis fracture with a fracture cleft extending to the  radiocarpal joint surface. No significant change in the overall alignment. Nondisplaced ulnar styloid process fracture. Electronically Signed   By: Elige Ko   On: 10/28/2016 17:42   Dg Hip Unilat W Or Wo Pelvis 2-3 Views Left  Result Date: 10/28/2016 CLINICAL DATA:  Status post fall.  Left hip pain EXAM: DG HIP (WITH OR WITHOUT PELVIS) 2-3V LEFT COMPARISON:  None. FINDINGS: Severe osteopenia. Comminuted left intertrochanteric fracture with mild displacement. No other acute fracture or dislocation. No aggressive osseous lesion. Mild osteoarthritis of bilateral sacroiliac joints. IMPRESSION: Acute comminuted left intertrochanteric fracture. Electronically Signed   By: Elige Ko   On: 10/28/2016 17:37     Assessment/Plan Principal Problem:   Closed left hip fracture (HCC) Active Problems:   GERD (gastroesophageal reflux disease)   HTN (hypertension)   Hyperlipidemia   Dementia due to Alzheimer's disease     1. Acute closed left hip fracture (communited intertrochanteric fracture with mild displacement): Patient was accepted in transfer from [facility] ED to the [facility] ED by Orthopedic consultand and [facility] were requested to admit for further management. As per orthopedics, plan for ORIF/IM nailing on 10/29/16. Based on available data, patient is at moderate risk for perioperative cardiopulmonary complications based on advanced age, frail physical status and comorbidities. She may however proceed with indicated surgery with close perioperative monitoring. 2. Left wrist fracture: Orthopedics consulted and as per their recommendation, will be managed nonoperatively with a splint. 3. Essential hypertension: Controlled. Continue ARB. 4. Hyperlipidemia: Continue Zetia. 5. Advanced dementia: As per son, Alzheimer's dementia. No behavioral abnormalities. Continue home dose of Namzaric. 6. Osteoporosis and vitamin D deficiency: Hold medications while hospitalized and may  resume at discharge. 7. Sinus bradycardia: Repeat EKG to reassess and check TSH. 8. Leukocytosis: Possibly stress response. No symptoms or signs suggestive of infection. Chest x-ray and urine microscopy unremarkable. Follow CBCs.   DVT prophylaxis: Lovenox  Code Status: DO NOT RESUSCITATE-confirmed with patient's son at bedside.  Family Communication: Discussed in detail with patient's son at bedside.  Disposition Plan: DC to SNF when medically stable.  Consults called: Orthopedics/Dr. Linna Caprice  Admission status: Inpatient medical bed.    [facility] MD [facility] Pager [phone]  If 7PM-7AM, please contact night-coverage [email or web address] Password [facility]  10/28/2016, 5:52 PM

## 2016-10-28 NOTE — ED Notes (Signed)
Patient transported to X-ray 

## 2016-10-29 ENCOUNTER — Encounter (HOSPITAL_COMMUNITY)
Admission: EM | Disposition: A | Discharge status: Home Health | Payer: Self-pay | Source: Home / Self Care | Attending: Internal Medicine

## 2016-10-29 ENCOUNTER — Inpatient Hospital Stay (HOSPITAL_COMMUNITY): Payer: Medicare HMO | Admitting: Anesthesiology

## 2016-10-29 ENCOUNTER — Inpatient Hospital Stay (HOSPITAL_COMMUNITY): Payer: Medicare HMO

## 2016-10-29 ENCOUNTER — Encounter (HOSPITAL_COMMUNITY): Payer: Self-pay | Admitting: Certified Registered Nurse Anesthetist

## 2016-10-29 DIAGNOSIS — S72102A Unspecified trochanteric fracture of left femur, initial encounter for closed fracture: Secondary | ICD-10-CM | POA: Diagnosis present

## 2016-10-29 HISTORY — PX: INTRAMEDULLARY (IM) NAIL INTERTROCHANTERIC: SHX5875

## 2016-10-29 LAB — MRSA PCR SCREENING: MRSA by PCR: NEGATIVE

## 2016-10-29 SURGERY — FIXATION, FRACTURE, INTERTROCHANTERIC, WITH INTRAMEDULLARY ROD
Anesthesia: General | Site: Hip | Laterality: Left

## 2016-10-29 MED ORDER — ROCURONIUM BROMIDE 100 MG/10ML IV SOLN
INTRAVENOUS | Status: DC | PRN
  Administered 2016-10-29: 30 mg via INTRAVENOUS

## 2016-10-29 MED ORDER — PROPOFOL 10 MG/ML IV BOLUS
INTRAVENOUS | Status: AC
  Filled 2016-10-29: qty 20

## 2016-10-29 MED ORDER — ONDANSETRON HCL 4 MG/2ML IJ SOLN
INTRAMUSCULAR | Status: DC | PRN
  Administered 2016-10-29: 4 mg via INTRAVENOUS

## 2016-10-29 MED ORDER — FENTANYL CITRATE (PF) 100 MCG/2ML IJ SOLN
INTRAMUSCULAR | Status: AC
  Filled 2016-10-29: qty 4

## 2016-10-29 MED ORDER — SODIUM CHLORIDE 0.9 % IV SOLN
INTRAVENOUS | Status: AC
  Administered 2016-10-29: 18:00:00 via INTRAVENOUS

## 2016-10-29 MED ORDER — PHENOL 1.4 % MT LIQD: 1.0000 | OROMUCOSAL | Status: DC | PRN

## 2016-10-29 MED ORDER — PROPOFOL 10 MG/ML IV BOLUS
INTRAVENOUS | Status: DC | PRN
  Administered 2016-10-29: 150 mg via INTRAVENOUS

## 2016-10-29 MED ORDER — SUGAMMADEX SODIUM 200 MG/2ML IV SOLN
INTRAVENOUS | Status: AC
  Filled 2016-10-29: qty 2

## 2016-10-29 MED ORDER — CEFAZOLIN SODIUM-DEXTROSE 2-4 GM/100ML-% IV SOLN
2.0000 g | Freq: Four times a day (QID) | INTRAVENOUS | Status: AC
  Administered 2016-10-29 – 2016-10-30 (×2): 2 g via INTRAVENOUS
  Filled 2016-10-29 (×2): qty 100

## 2016-10-29 MED ORDER — FENTANYL CITRATE (PF) 100 MCG/2ML IJ SOLN
INTRAMUSCULAR | Status: AC
  Filled 2016-10-29: qty 2

## 2016-10-29 MED ORDER — LIDOCAINE HCL (CARDIAC) 20 MG/ML IV SOLN
INTRAVENOUS | Status: DC | PRN
  Administered 2016-10-29: 60 mg via INTRAVENOUS

## 2016-10-29 MED ORDER — METOCLOPRAMIDE HCL 5 MG/ML IJ SOLN: 10.0000 mg | Freq: Once | INTRAMUSCULAR | Status: DC | PRN

## 2016-10-29 MED ORDER — LACTATED RINGERS IV SOLN: INTRAVENOUS | Status: DC

## 2016-10-29 MED ORDER — LACTATED RINGERS IV SOLN
INTRAVENOUS | Status: DC
  Administered 2016-10-29 (×2): via INTRAVENOUS
  Administered 2016-11-01: 50 mL/h via INTRAVENOUS

## 2016-10-29 MED ORDER — MORPHINE SULFATE (PF) 2 MG/ML IV SOLN: 0.5000 mg | INTRAVENOUS | Status: DC | PRN

## 2016-10-29 MED ORDER — METOCLOPRAMIDE HCL 5 MG PO TABS: 5.0000 mg | ORAL_TABLET | Freq: Three times a day (TID) | ORAL | Status: DC | PRN

## 2016-10-29 MED ORDER — METOCLOPRAMIDE HCL 5 MG/ML IJ SOLN: 5.0000 mg | Freq: Three times a day (TID) | INTRAMUSCULAR | Status: DC | PRN

## 2016-10-29 MED ORDER — PHENYLEPHRINE 40 MCG/ML (10ML) SYRINGE FOR IV PUSH (FOR BLOOD PRESSURE SUPPORT)
PREFILLED_SYRINGE | INTRAVENOUS | Status: DC | PRN
  Administered 2016-10-29: 80 ug via INTRAVENOUS

## 2016-10-29 MED ORDER — GLYCOPYRROLATE 0.2 MG/ML IV SOSY
PREFILLED_SYRINGE | INTRAVENOUS | Status: DC | PRN
  Administered 2016-10-29: .2 mg via INTRAVENOUS

## 2016-10-29 MED ORDER — SUGAMMADEX SODIUM 200 MG/2ML IV SOLN
INTRAVENOUS | Status: DC | PRN
  Administered 2016-10-29: 140 mg via INTRAVENOUS

## 2016-10-29 MED ORDER — HYDROMORPHONE HCL 1 MG/ML IJ SOLN: 0.2500 mg | INTRAMUSCULAR | Status: DC | PRN

## 2016-10-29 MED ORDER — 0.9 % SODIUM CHLORIDE (POUR BTL) OPTIME
TOPICAL | Status: DC | PRN
  Administered 2016-10-29: 1000 mL

## 2016-10-29 MED ORDER — FENTANYL CITRATE (PF) 100 MCG/2ML IJ SOLN
INTRAMUSCULAR | Status: DC | PRN
  Administered 2016-10-29 (×5): 50 ug via INTRAVENOUS

## 2016-10-29 MED ORDER — ENOXAPARIN SODIUM 40 MG/0.4ML ~~LOC~~ SOLN
40.0000 mg | SUBCUTANEOUS | Status: DC
  Administered 2016-10-30 – 2016-10-31 (×2): 40 mg via SUBCUTANEOUS
  Filled 2016-10-29 (×2): qty 0.4

## 2016-10-29 MED ORDER — MENTHOL 3 MG MT LOZG: 1.0000 | LOZENGE | OROMUCOSAL | Status: DC | PRN

## 2016-10-29 MED ORDER — MEPERIDINE HCL 25 MG/ML IJ SOLN: 6.2500 mg | INTRAMUSCULAR | Status: DC | PRN

## 2016-10-29 MED ORDER — HYDROCODONE-ACETAMINOPHEN 5-325 MG PO TABS
1.0000 | ORAL_TABLET | Freq: Four times a day (QID) | ORAL | Status: DC | PRN
  Administered 2016-10-30: 1 via ORAL
  Administered 2016-11-05: 2 via ORAL
  Filled 2016-10-29: qty 2
  Filled 2016-10-29: qty 1

## 2016-10-29 SURGICAL SUPPLY — 54 items
ADH SKN CLS APL DERMABOND .7 (GAUZE/BANDAGES/DRESSINGS) ×1
ALCOHOL ISOPROPYL (RUBBING) (MISCELLANEOUS) ×3 IMPLANT
BIT DRILL 4.3MMS DISTAL GRDTED (BIT) IMPLANT
BIT DRILL LAG SCREW (DRILL) IMPLANT
BNDG COHESIVE 4X5 TAN STRL (GAUZE/BANDAGES/DRESSINGS) IMPLANT
CHLORAPREP W/TINT 26ML (MISCELLANEOUS) ×3 IMPLANT
COVER PERINEAL POST (MISCELLANEOUS) ×3 IMPLANT
COVER SURGICAL LIGHT HANDLE (MISCELLANEOUS) ×3 IMPLANT
DERMABOND ADVANCED (GAUZE/BANDAGES/DRESSINGS) ×2
DERMABOND ADVANCED .7 DNX12 (GAUZE/BANDAGES/DRESSINGS) ×2 IMPLANT
DRAPE C-ARM 42X72 X-RAY (DRAPES) ×3 IMPLANT
DRAPE C-ARMOR (DRAPES) ×3 IMPLANT
DRAPE IMP U-DRAPE 54X76 (DRAPES) ×6 IMPLANT
DRAPE PROXIMA HALF (DRAPES) ×3 IMPLANT
DRAPE STERI IOBAN 125X83 (DRAPES) ×3 IMPLANT
DRAPE U-SHAPE 47X51 STRL (DRAPES) ×6 IMPLANT
DRAPE UNIVERSAL PACK (DRAPES) ×3 IMPLANT
DRILL 4.3MMS DISTAL GRADUATED (BIT) ×3
DRILL LAG SCREW (DRILL) ×3
DRSG MEPILEX BORDER 4X4 (GAUZE/BANDAGES/DRESSINGS) ×4 IMPLANT
DRSG PAD ABDOMINAL 8X10 ST (GAUZE/BANDAGES/DRESSINGS) IMPLANT
ELECT REM PT RETURN 9FT ADLT (ELECTROSURGICAL) ×3
ELECTRODE REM PT RTRN 9FT ADLT (ELECTROSURGICAL) ×1 IMPLANT
FACESHIELD WRAPAROUND (MASK) ×3 IMPLANT
FACESHIELD WRAPAROUND OR TEAM (MASK) ×1 IMPLANT
GLOVE BIO SURGEON STRL SZ8.5 (GLOVE) ×6 IMPLANT
GLOVE BIOGEL PI IND STRL 8.5 (GLOVE) ×1 IMPLANT
GLOVE BIOGEL PI INDICATOR 8.5 (GLOVE) ×2
GOWN STRL REUS W/ TWL LRG LVL3 (GOWN DISPOSABLE) ×2 IMPLANT
GOWN STRL REUS W/TWL 2XL LVL3 (GOWN DISPOSABLE) ×7 IMPLANT
GOWN STRL REUS W/TWL LRG LVL3 (GOWN DISPOSABLE) ×6
GUIDEWIRE BALL NOSE 80CM (WIRE) ×2 IMPLANT
HIP FRA NAIL LAG SCREW 10.5X90 (Orthopedic Implant) ×3 IMPLANT
KIT ROOM TURNOVER OR (KITS) ×3 IMPLANT
MANIFOLD NEPTUNE II (INSTRUMENTS) ×3 IMPLANT
MARKER SKIN DUAL TIP RULER LAB (MISCELLANEOUS) ×3 IMPLANT
NAIL HIP FRA AFFIX 130X9X380 L (Nail) ×2 IMPLANT
NS IRRIG 1000ML POUR BTL (IV SOLUTION) ×3 IMPLANT
PACK GENERAL/GYN (CUSTOM PROCEDURE TRAY) ×3 IMPLANT
PAD ARMBOARD 7.5X6 YLW CONV (MISCELLANEOUS) ×6 IMPLANT
PADDING CAST ABS 4INX4YD NS (CAST SUPPLIES)
PADDING CAST ABS COTTON 4X4 ST (CAST SUPPLIES) IMPLANT
SCREW BONE CORTICAL 5.0X32 (Screw) ×2 IMPLANT
SCREW LAG HIP FRA NAIL 10.5X90 (Orthopedic Implant) IMPLANT
SUT MNCRL AB 3-0 PS2 27 (SUTURE) ×3 IMPLANT
SUT MON AB 2-0 CT1 27 (SUTURE) ×3 IMPLANT
SUT MON AB 2-0 CT1 36 (SUTURE) ×2 IMPLANT
SUT MON AB 3-0 SH 27 (SUTURE) ×3
SUT MON AB 3-0 SH27 (SUTURE) IMPLANT
SUT VIC AB 1 CT1 27 (SUTURE) ×3
SUT VIC AB 1 CT1 27XBRD ANBCTR (SUTURE) ×1 IMPLANT
THREADED GUIDE PIN 3.2*444MM ×4 IMPLANT
TOWEL OR 17X24 6PK STRL BLUE (TOWEL DISPOSABLE) ×3 IMPLANT
TOWEL OR 17X26 10 PK STRL BLUE (TOWEL DISPOSABLE) ×3 IMPLANT

## 2016-10-29 NOTE — Op Note (Signed)
OPERATIVE REPORT  SURGEON: Samson FredericBrian Gwendolyn Mclees, MD   ASSISTANT: Hart CarwinJustin Queen, RNFA.  PREOPERATIVE DIAGNOSIS: Comminuted Left peritrochanteric femur fracture.   POSTOPERATIVE DIAGNOSIS: Comminuted Left peritrochanteric femur fracture.   PROCEDURE: Intramedullary fixation, Left femur.   IMPLANTS: Biomet affixes hip fracture nail, 9 x 380 mm, 130. 10.5 x 90 mm lag screw. 5.0 x 32 mm distal interlocking screw 1.  ANESTHESIA:  General  ESTIMATED BLOOD LOSS: 150 mL.  ANTIBIOTICS:  2 g Ancef.  DRAINS: None.  COMPLICATIONS: None.   CONDITION: PACU - hemodynamically stable.Marland Kitchen.   BRIEF CLINICAL NOTE: Vickie SnowballHelen M Bettendorf is a 83 y.o. female who presented with an intertrochanteric femur fracture. The patient was admitted to the hospitalist service and underwent perioperative risk stratification and medical optimization. The risks, benefits, and alternatives to the procedure were explained, and the patient elected to proceed.  PROCEDURE IN DETAIL: Surgical site was marked by myself. The patient was taken to the operating room and anesthesia was induced on the bed. The [facility] and the nonoperative lower extremity was scissored underneath the operative side. The fracture was reduced with traction, internal rotation, and adduction. The hip was prepped and draped in the normal sterile surgical fashion. Timeout was called verifying side and site of surgery. Preop antibiotics were given with 60 minutes of beginning the procedure.  Fluoroscopy was used to define the patient's anatomy. A 4 cm incision was made just proximal to the tip of the greater trochanter. The awl was used to obtain the standard starting point for a trochanteric entry nail under fluoroscopic control. The guidewire was placed. The entry reamer was used to open the proximal femur.  I placed the guidewire to the level of the physeal scar of the knee. I measured the length of the guidepin. Sequential  reaming was performed up to a size 10.5 mm with excellent chatter. Therefore, a size 9 by 380 mm nail was selected and assembled to the jig on the back table. The nail was placed without any difficulty. Due to the comminuted nature of the fracture, the alignment at the level of the femoral neck was not anatomic. I made a separate incision over the anterior aspect of the greater trochanter and I placed a bone hook subperiosteally around the femoral neck. The bone hook allowed improved reduction of apex anterior deformity on the lateral x-ray as well as the stepoff to the medial calcar area. Through a separate stab incision, the cannula was placed down to the bone in preparation for the cephalomedullary device. A guidepin was placed into the femoral head using AP and lateral fluoroscopy views. The pin was measured, and then reaming was performed to the appropriate depth. I placed a derotational guidepin. The lag screw was inserted to the appropriate depth. The setscrew was tightened and then loosened one quarter turn. Using perfect circle technique, a distal interlocking screw was placed. The jig and guide pins were removed. Final AP and lateral fluoroscopy views were obtained to confirm fracture reduction and hardware placement. Tip apex distance was appropriate. There was no chondral penetration.  The wounds were copiously irrigated with saline. The wound was closed in layers with #1 Vicryl for the fascia, 2-0 Monocryl for the deep dermal layer, and 3-0 Monocryl subcuticular stitch. Glue was applied to the skin. Once the glue was fully hardened, sterile dressing was applied. The patient was then awakened from anesthesia and taken to the PACU in stable condition. Sponge needle and instrument counts were correct  at the end of the case 2. There were no known complications.  We will readmit the patient to the hospitalist. Weightbearing status will be touchdown weightbearing left lower extremity, and  nonweightbearing left upper extremity. We will begin Lovenox for DVT prophylaxis. The patient will work with physical therapy and undergo disposition planning.

## 2016-10-29 NOTE — Progress Notes (Signed)
PROGRESS NOTE  Lamont SnowballHelen M Degante  EAV:[id number] DOB: Aug 05, 1928  DOA: 10/28/2016 PCP: Rudi HeapMOORE, DONALD, MD   Brief Narrative:  83 y.o. female, [facility] [address] [facility], ambulate short distances in the ALF without the help of a cane or a walker, dependent on activities of daily living, PMH of fairly advanced dementia, HTN, HLD, vitamin D deficiency, osteoporosis presented to the ED following a mechanical fall at the facility followed by left-sided hip and wrist pain. [facility] and [facility] and management by orthopedics. Scheduled for surgical fixation of left hip fracture on 10/29/16 afternoon.   Assessment & Plan:   Principal Problem:   Closed left hip fracture (HCC) Active Problems:   GERD (gastroesophageal reflux disease)   HTN (hypertension)   Hyperlipidemia   Dementia due to Alzheimer's disease    1. Acute closed left hip fracture (communited intertrochanteric fracture with mild displacement): [facility] further management. As per orthopedics, plan for ORIF/IM nailing on 10/29/16. Based on available data, patient is at moderate risk for perioperative cardiopulmonary complications based on advanced age, frail physical status and comorbidities. She may however proceed with indicated surgery with close perioperative monitoring. 2. Left wrist fracture: Orthopedics consulted and as per their recommendation, will be managed nonoperatively with a splint. 3. Essential hypertension:  reasonable inpatient control. Continue ARB. 4. Hyperlipidemia: Continue Zetia. 5. Advanced dementia: As per son, Alzheimer's dementia. No behavioral abnormalities. Continue home dose of Namzaric. 6. Osteoporosis and vitamin D deficiency: Hold medications while hospitalized and may resume at  discharge. 7. Sinus bradycardia:  TSH normal. Repeat EKG on 10/28/16 shows sinus rhythm at 62 bpm with few PACs, normal axis, and no acute changes. QTC 439 ms. She is asymptomatic. 8. Leukocytosis: Possibly stress response. No symptoms or signs suggestive of infection. Chest x-ray and urine microscopy unremarkable. Improved.     DVT prophylaxis: Lovenox Code Status: DO NOT RESUSCITATE Family Communication: Discussed with patient's younger son at bedside. Updated care and answered questions. Disposition Plan: Likely to SNF when medically stable, possibly in the next 72 hours.   Consultants:   Orthopedics  Procedures:   Foley catheter  Antimicrobials:   None    Subjective: Seen this morning. Denied complaints. Denied pain. As per discussion with patient's younger son yesterday and today, no cardiac or pulmonary or stroke history. No history of CAD, MI, CHF, arrhythmias, COPD, asthma or strokes. As per RN, no acute issues.  Objective:  Vitals:   10/28/16 1816 10/28/16 1905 10/28/16 2130 10/29/16 0648  BP: (!) 151/53 (!) 146/71 (!) 150/65 (!) 136/59  Pulse: (!) 55 (!) 55 67 (!) 57  Resp: 20 18 16 16   Temp: 98.5 F (36.9 C) 98.6 F (37 C) 98.7 F (37.1 C) 99.1 F (37.3 C)  TempSrc: Oral  Oral Oral  SpO2: 98% 96% 96% 93%    Intake/Output Summary (Last 24 hours) at 10/29/16 1059 Last data filed at 10/29/16 0649  Gross per 24 hour  Intake                0 ml  Output              750 ml  Net             -750 ml   There were no vitals filed  for this visit.  Examination:  General exam: Pleasant elderly female lying comfortably supine in bed. Son at bedside. Eyes: PERTLA and conjunctivae normal. Left periorbital ecchymosis and left eye closure at birth on opening eyelids, visual acuity okay, no subconjunctival hemorrhage. Respiratory system: Clear to auscultation. Respiratory effort normal. Cardiovascular system: S1 & S2 heard, RRR. No JVD, murmurs, rubs, gallops or  clicks. No pedal edema. Gastrointestinal system: Abdomen is nondistended, soft and nontender. No organomegaly or masses felt. Normal bowel sounds heard. Central nervous system: Alert and oriented only to self. No focal neurological deficits. Extremities:  Left forearm in a splint. Able to move her fingers well. Well-perfused. Symmetric 5 x 5 power in all limbs except left lower extremity power assessment limited by left hip pain. Left lower extremity shortened and externally rotated. Symmetric pulses in lower extremities. Skin: No rashes, lesions or ulcers Psychiatry: Judgement and insight impaired. Mood & affect appropriate.     Data Reviewed: I have personally reviewed following labs and imaging studies  CBC:  Recent Labs Lab 10/28/16 1910  WBC 12.0*  NEUTROABS 9.7*  HGB 13.5  HCT 41.3  MCV 96.7  PLT 232   Basic Metabolic Panel:  Recent Labs Lab 10/28/16 1910  NA 133*  K 4.1  CL 99*  CO2 25  GLUCOSE 155*  BUN 12  CREATININE 0.74  CALCIUM 8.6*   GFR: CrCl cannot be calculated (Unknown ideal weight.). Liver Function Tests: No results for input(s): AST, ALT, ALKPHOS, BILITOT, PROT, ALBUMIN in the last 168 hours. No results for input(s): LIPASE, AMYLASE in the last 168 hours. No results for input(s): AMMONIA in the last 168 hours. Coagulation Profile: No results for input(s): INR, PROTIME in the last 168 hours. Cardiac Enzymes: No results for input(s): CKTOTAL, CKMB, CKMBINDEX, TROPONINI in the last 168 hours. BNP (last 3 results) No results for input(s): PROBNP in the last 8760 hours. HbA1C: No results for input(s): HGBA1C in the last 72 hours. CBG: No results for input(s): GLUCAP in the last 168 hours. Lipid Profile: No results for input(s): CHOL, HDL, LDLCALC, TRIG, CHOLHDL, LDLDIRECT in the last 72 hours. Thyroid Function Tests:  Recent Labs  10/28/16 1910  TSH 1.627   Anemia Panel: No results for input(s): VITAMINB12, FOLATE, FERRITIN, TIBC, IRON,  RETICCTPCT in the last 72 hours.  Sepsis Labs:  Recent Labs Lab 10/28/16 1910  WBC 12.0*    Recent Results (from the past 240 hour(s))  MRSA PCR Screening     Status: None   Collection Time: 10/29/16  6:37 AM  Result Value Ref Range Status   MRSA by PCR NEGATIVE NEGATIVE Final    Comment:        The GeneXpert MRSA Assay (FDA approved for NASAL specimens only), is one component of a comprehensive MRSA colonization surveillance program. It is not intended to diagnose MRSA infection nor to guide or monitor treatment for MRSA infections.          Radiology Studies: Dg Chest 1 View  Result Date: 10/28/2016 CLINICAL DATA:  Fall, hip fracture EXAM: CHEST 1 VIEW COMPARISON:  10/28/2016 at 1052 hours FINDINGS: Lungs are clear.  No pleural effusion or pneumothorax. The heart is normal in size. Thoracic aortic atherosclerosis. IMPRESSION: No evidence of acute cardiopulmonary disease. Thoracic aortic atherosclerosis. Electronically Signed   By: Charline Bills M.D.   On: 10/28/2016 17:38   Dg Wrist Complete Left  Result Date: 10/28/2016 CLINICAL DATA:  Status post fall, interval cast placement EXAM: LEFT WRIST - COMPLETE  3+ VIEW COMPARISON:  Same day FINDINGS: Comminuted distal radial metaphysis fracture with a fracture cleft extending to the radiocarpal joint surface. No significant change in the overall alignment. Nondisplaced ulnar styloid process fracture. Severe osteoarthritis of the first [facility] joint. Mild osteoarthritis of the scaphotrapeziotrapezoid joint. IMPRESSION: Comminuted distal radial metaphysis fracture with a fracture cleft extending to the radiocarpal joint surface. No significant change in the overall alignment. Nondisplaced ulnar styloid process fracture. Electronically Signed   By: Elige Ko   On: 10/28/2016 17:42   Dg Hip Unilat W Or Wo Pelvis 2-3 Views Left  Result Date: 10/28/2016 CLINICAL DATA:  Status post fall.  Left hip pain EXAM: DG HIP (WITH OR WITHOUT  PELVIS) 2-3V LEFT COMPARISON:  None. FINDINGS: Severe osteopenia. Comminuted left intertrochanteric fracture with mild displacement. No other acute fracture or dislocation. No aggressive osseous lesion. Mild osteoarthritis of bilateral sacroiliac joints. IMPRESSION: Acute comminuted left intertrochanteric fracture. Electronically Signed   By: Elige Ko   On: 10/28/2016 17:37        Scheduled Meds: .  ceFAZolin (ANCEF) IV  2 g Intravenous To SSTC  . chlorhexidine  60 mL Topical Once  . memantine  28 mg Oral QHS   And  . donepezil  10 mg Oral QHS  . enoxaparin (LOVENOX) injection  40 mg Subcutaneous Q24H  . ezetimibe  10 mg Oral Daily  . irbesartan  150 mg Oral Daily  . povidone-iodine  2 application Topical Once   Continuous Infusions:   LOS: 1 day        [facility] Pager [phone]  If 7PM-7AM, please contact night-coverage [email or web address] Password [facility] 10/29/2016, 10:59 AM

## 2016-10-29 NOTE — Consult Note (Signed)
ORTHOPAEDIC CONSULTATION  REQUESTING PHYSICIAN: Elease EtienneAnand D Hongalgi, MD  PCP:  Rudi HeapMOORE, DONALD, MD  Chief Complaint: Left intertrochanteric femur fracture, left distal radius fracture  HPI: Vickie SnowballHelen M Berg is a 83 y.o. female who was transferred last night for the above-mentioned injuries [facility]. Patient resides at [facility]. She had an unwitnessed fall. She is unable to recall the details due to dementia. Sugar tong splint was placed to the left upper extremity in the emergency department. She was admitted by the hospitalist who performed medical optimization and perioperative risk stratification. Orthopedic consultation was placed for management of her injuries.  Past Medical History:  Diagnosis Date  . GERD (gastroesophageal reflux disease)   . Hyperlipidemia   . Hypertension   . Memory deficit   . Osteoporosis    Past Surgical History:  Procedure Laterality Date  . ABDOMINAL HYSTERECTOMY    . EYE SURGERY     Social History   Social History  . Marital status: Single    Spouse name: N/A  . Number of children: N/A  . Years of education: N/A   Social History Main Topics  . Smoking status: Never Smoker  . Smokeless tobacco: Never Used  . Alcohol use No  . Drug use: No  . Sexual activity: Not Asked   Other Topics Concern  . None   Social History Narrative  . None   Family History  Problem Relation Age of Onset  . Family history unknown: Yes   Allergies  Allergen Reactions  . Actonel [Risedronate Sodium] Other (See Comments)  . Lipitor [Atorvastatin] Other (See Comments)   Prior to Admission medications   Medication Sig Start Date End Date Taking? Authorizing Provider  acetaminophen (TYLENOL) 500 MG tablet Take 1,000 mg by mouth every 6 (six) hours as needed for mild pain or headache.   Yes Historical Provider, MD  alendronate (FOSAMAX) 70 MG tablet Take 70 mg by mouth once a week. Take with a full glass of water on an empty stomach.   Yes  Historical Provider, MD  ezetimibe (ZETIA) 10 MG tablet Take 10 mg by mouth daily.   Yes Historical Provider, MD  Memantine HCl-Donepezil HCl (NAMZARIC) 28-10 MG CP24 Take 1 tablet by mouth at bedtime. 07/07/14  Yes Ernestina Pennaonald W Moore, MD  valsartan (DIOVAN) 160 MG tablet Take 160 mg by mouth daily.   Yes Historical Provider, MD  Vitamin D, Ergocalciferol, (DRISDOL) 50000 UNITS CAPS capsule Take 1 capsule (50,000 Units total) by mouth every 7 (seven) days. 07/08/14  Yes Ernestina Pennaonald W Moore, MD   Dg Chest 1 View  Result Date: 10/28/2016 CLINICAL DATA:  Fall, hip fracture EXAM: CHEST 1 VIEW COMPARISON:  10/28/2016 at 1052 hours FINDINGS: Lungs are clear.  No pleural effusion or pneumothorax. The heart is normal in size. Thoracic aortic atherosclerosis. IMPRESSION: No evidence of acute cardiopulmonary disease. Thoracic aortic atherosclerosis. Electronically Signed   By: Charline BillsSriyesh  Krishnan M.D.   On: 10/28/2016 17:38   Dg Wrist Complete Left  Result Date: 10/28/2016 CLINICAL DATA:  Status post fall, interval cast placement EXAM: LEFT WRIST - COMPLETE 3+ VIEW COMPARISON:  Same day FINDINGS: Comminuted distal radial metaphysis fracture with a fracture cleft extending to the radiocarpal joint surface. No significant change in the overall alignment. Nondisplaced ulnar styloid process fracture. 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 Mild osteoarthritis of the scaphotrapeziotrapezoid joint. IMPRESSION: Comminuted distal radial metaphysis fracture with a fracture cleft extending to the radiocarpal joint surface. No significant change in the  overall alignment. Nondisplaced ulnar styloid process fracture. Electronically Signed   By: Elige Ko   On: 10/28/2016 17:42   Dg Hip Unilat W Or Wo Pelvis 2-3 Views Left  Result Date: 10/28/2016 CLINICAL DATA:  Status post fall.  Left hip pain EXAM: DG HIP (WITH OR WITHOUT PELVIS) 2-3V LEFT COMPARISON:  None. FINDINGS: Severe osteopenia. Comminuted left intertrochanteric  fracture with mild displacement. No other acute fracture or dislocation. No aggressive osseous lesion. Mild osteoarthritis of bilateral sacroiliac joints. IMPRESSION: Acute comminuted left intertrochanteric fracture. Electronically Signed   By: Elige Ko   On: 10/28/2016 17:37    Positive ROS: All other systems have been reviewed and were otherwise negative with the exception of those mentioned in the HPI and as above.  Physical Exam: General: Alert, no acute distress Cardiovascular: No pedal edema Respiratory: No cyanosis, no use of accessory musculature GI: No organomegaly, abdomen is soft and non-tender Skin: No lesions in the area of chief complaint Neurologic: Sensation intact distally Psychiatric: Dementia Lymphatic: No axillary or cervical lymphadenopathy  MUSCULOSKELETAL: Examination of the right upper extremity reveals no skin wounds or lesions, no tenderness to palpation, no crepitus. she has full painless range of motion. Neurovascularly intact.  Examination of the right lower extremity reveals no skin wounds or lesions. no pain with range of motion of the hip or knee. no swelling no tenderness to palpation. Neurovascularly intact.  Examination of the left lower extremity reveals no skin wounds or lesions. The extremity is shortened and externally rotated. She has pain with attempted range of motion of the hip. She is able to wiggle her toes. She has palpable pulses.  Examination of the left upper extremity reveals she has an intact sugar tong splint. Her hand is swollen. She has positive motor function AIN, when necessary, and ulnar motor nerves. She reports intact sensibility. Brisk cap refill present.  Assessment: #1 comminuted left peritrochanteric femur fracture. #2 extra-articular left distal radius fracture, minimally displaced, amenable to conservative treatment.  Plan: I discussed the findings with the patient and her family. Unfortunately, she has in excess of 30%  risk of 1 year mortality due to her injury pattern. Nevertheless, I recommended operative stabilization of her comminuted left intertrochanteric femur fracture for pain control and to aid in mobilization. They understand that there is a risk of nonunion, malunion, and needing additional surgery. If the fracture fails to heal, in the future she would require hardware removal and hemiarthroplasty versus total hip arthroplasty. They understand that we will manage the left wrist nonoperatively. She will have weightbearing restrictions postoperatively.  The risks, benefits, and alternatives were discussed with the patient and her family. There are risks associated with the surgery including, but not limited to, problems with anesthesia (death), infection, differences in leg length/angulation/rotation, fracture of bones, loosening or failure of implants, malunion, nonunion, hematoma (blood accumulation) which may require surgical drainage, blood clots, pulmonary embolism, nerve injury (foot drop), and blood vessel injury. The patient and her family understand these risks and elects to proceed.    Loyed Wilmes, Cloyde Reams, MD Cell [phone]    10/29/2016 3:00 PM

## 2016-10-29 NOTE — Transfer of Care (Signed)
Immediate Anesthesia Transfer of Care Note  Patient: Vickie SnowballHelen M Hall  Procedure(s) Performed: Procedure(s): INTRAMEDULLARY (IM) NAIL INTERTROCHANTRIC (Left)  Patient Location: PACU  Anesthesia Type:General  Level of Consciousness: awake and patient cooperative  Airway & Oxygen Therapy: Patient Spontanous Breathing  Post-op Assessment: Report given to RN and Post -op Vital signs reviewed and stable  Post vital signs: Reviewed and stable  Last Vitals:  Vitals:   10/29/16 0648 10/29/16 1705  BP: (!) 136/59   Pulse: (!) 57   Resp: 16   Temp: 37.3 C (P) 36.4 C    Last Pain:  Vitals:   10/29/16 1705  TempSrc:   PainSc: (P) 0-No pain         Complications: No apparent anesthesia complications

## 2016-10-29 NOTE — Anesthesia Preprocedure Evaluation (Addendum)
Anesthesia Evaluation  Patient identified by MRN, date of birth, ID band Patient awake    Reviewed: Allergy & Precautions, NPO status , Patient's Chart, lab work & pertinent test results  Airway Mallampati: III       Dental  (+) Teeth Intact   Pulmonary asthma ,    Pulmonary exam normal breath sounds clear to auscultation       Cardiovascular hypertension, Pt. on medications + Peripheral Vascular Disease  + dysrhythmias Atrial Fibrillation  Rhythm:Regular Rate:Normal     Neuro/Psych PSYCHIATRIC DISORDERS Alzheimer's disease    GI/Hepatic Neg liver ROS, GERD  Medicated and Controlled,  Endo/Other  Hyperlipidemia Hyperglycemia  Renal/GU negative Renal ROS  negative genitourinary   Musculoskeletal Osteoporosis Left intertrochanteric hip Fx   Abdominal   Peds  Hematology   Anesthesia Other Findings   Reproductive/Obstetrics                            Anesthesia Physical Anesthesia Plan  ASA: III  Anesthesia Plan: General   Post-op Pain Management:    Induction: Intravenous  Airway Management Planned: Oral ETT  Additional Equipment:   Intra-op Plan:   Post-operative Plan: Extubation in OR  Informed Consent: I have reviewed the patients History and Physical, chart, labs and discussed the procedure including the risks, benefits and alternatives for the proposed anesthesia with the patient or authorized representative who has indicated his/her understanding and acceptance.   Dental advisory given  Plan Discussed with: Anesthesiologist, CRNA and Surgeon  Anesthesia Plan Comments:         Anesthesia Quick Evaluation

## 2016-10-29 NOTE — Discharge Instructions (Signed)
Dr. Samson FredericBrian Vinod Mikesell Adult Hip & Knee Specialist [facility] [address] [place], [place] [address] [phone]   POSTOPERATIVE DIRECTIONS    Hip Rehabilitation, Guidelines Following Surgery   WEIGHT BEARING Other:  Touch down weight bearing (30%) Left leg. non weight bearing left upper extremity.   HOME CARE INSTRUCTIONS  Remove items at home which could result in a fall. This includes throw rugs or furniture in walking pathways.  Continue medications as instructed at time of discharge.  You may have some home medications which will be placed on hold until you complete the course of blood thinner medication.  4 days after discharge, you may start showering. No tub baths or soaking your incisions. Do not put on socks or shoes without following the instructions of your caregivers.   Sit on chairs with arms. Use the chair arms to help push yourself up when arising.  Arrange for the use of a toilet seat elevator so you are not sitting low.   Walk with walker as instructed.  You may resume a sexual relationship in one month or when given the OK by your caregiver.  Use walker as long as suggested by your caregivers.  Avoid periods of inactivity such as sitting longer than an hour when not asleep. This helps prevent blood clots.  You may return to work once you are cleared by Designer, industrial/productyour surgeon.  Do not drive a car for 6 weeks or until released by your surgeon.  Do not drive while taking narcotics.  Wear elastic stockings for two weeks following surgery during the day but you may remove then at night.  Make sure you keep all of your appointments after your operation with all of your doctors and caregivers. You should call the office at the above phone number and make an appointment for approximately two weeks after the date of your surgery. Please pick up a stool softener and laxative for home use as long as you are requiring pain medications.  ICE to the  affected hip every three hours for 30 minutes at a time and then as needed for pain and swelling. Continue to use ice on the hip for pain and swelling from surgery. You may notice swelling that will progress down to the foot and ankle.  This is normal after surgery.  Elevate the leg when you are not up walking on it.   It is important for you to complete the blood thinner medication as prescribed by your doctor.  Continue to use the breathing machine which will help keep your temperature down.  It is common for your temperature to cycle up and down following surgery, especially at night when you are not up moving around and exerting yourself.  The breathing machine keeps your lungs expanded and your temperature down.  RANGE OF MOTION AND STRENGTHENING EXERCISES  These exercises are designed to help you keep full movement of your hip joint. Follow your caregiver's or physical therapist's instructions. Perform all exercises about fifteen times, three times per day or as directed. Exercise both hips, even if you have had only one joint replacement. These exercises can be done on a training (exercise) mat, on the floor, on a table or on a bed. Use whatever works the best and is most comfortable for you. Use music or television while you are exercising so that the exercises are a pleasant break in your day. This will make your life better with the exercises acting as a break in routine  you can look forward to.  °Lying on your back, slowly slide your foot toward your buttocks, raising your knee up off the floor. Then slowly slide your foot back down until your leg is straight again.  °Lying on your back spread your legs as far apart as you can without causing discomfort.  °Lying on your side, raise your upper leg and foot straight up from the floor as far as is comfortable. Slowly lower the leg and repeat.  °Lying on your back, tighten up the muscle in the front of your thigh (quadriceps muscles). You can do this by  keeping your leg straight and trying to raise your heel off the floor. This helps strengthen the largest muscle supporting your knee.  °Lying on your back, tighten up the muscles of your buttocks both with the legs straight and with the knee bent at a comfortable angle while keeping your heel on the floor.  ° °SKILLED REHAB INSTRUCTIONS: °If the patient is transferred to a skilled rehab facility following release from the hospital, a list of the current medications will be sent to the facility for the patient to continue.  When discharged from the skilled rehab facility, please have the facility set up the patient's Home Health Physical Therapy prior to being released. Also, the skilled facility will be responsible for providing the patient with their medications at time of release from the facility to include their pain medication and their blood thinner medication. If the patient is still at the rehab facility at time of the two week follow up appointment, the skilled rehab facility will also need to assist the patient in arranging follow up appointment in our office and any transportation needs. ° °MAKE SURE YOU:  °Understand these instructions.  °Will watch your condition.  °Will get help right away if you are not doing well or get worse. ° °Pick up stool softner and laxative for home use following surgery while on pain medications. °Daily dry dressing changes as needed. °In 4 days, you may remove your dressings and begin taking showers - no tub baths or soaking the incisions. °Continue to use ice for pain and swelling after surgery. °Do not use any lotions or creams on the incision until instructed by your surgeon. ° ° °

## 2016-10-29 NOTE — Anesthesia Procedure Notes (Signed)
Procedure Name: Intubation Date/Time: 10/29/2016 3:12 PM Performed by: Rosiland OzMEYERS, Anik Wesch Pre-anesthesia Checklist: Patient identified, Emergency Drugs available, Suction available, Patient being monitored and Timeout performed Patient Re-evaluated:Patient Re-evaluated prior to inductionOxygen Delivery Method: Circle system utilized Preoxygenation: Pre-oxygenation with 100% oxygen Intubation Type: IV induction Ventilation: Mask ventilation without difficulty and Oral airway inserted - appropriate to patient size Laryngoscope Size: 3 and Miller Grade View: Grade II Tube size: 7.0 mm Number of attempts: 1 Airway Equipment and Method: Stylet Placement Confirmation: ETT inserted through vocal cords under direct vision,  positive ETCO2 and breath sounds checked- equal and bilateral Secured at: 21 cm Tube secured with: Tape Dental Injury: Teeth and Oropharynx as per pre-operative assessment

## 2016-10-30 ENCOUNTER — Inpatient Hospital Stay (HOSPITAL_COMMUNITY): Payer: Medicare HMO

## 2016-10-30 DIAGNOSIS — S72002D Fracture of unspecified part of neck of left femur, subsequent encounter for closed fracture with routine healing: Secondary | ICD-10-CM

## 2016-10-30 DIAGNOSIS — I10 Essential (primary) hypertension: Secondary | ICD-10-CM

## 2016-10-30 LAB — BASIC METABOLIC PANEL
Anion gap: 11 (ref 5–15)
BUN: 11 mg/dL (ref 6–20)
CHLORIDE: 101 mmol/L (ref 101–111)
CO2: 24 mmol/L (ref 22–32)
CREATININE: 0.67 mg/dL (ref 0.44–1.00)
Calcium: 7.7 mg/dL — ABNORMAL LOW (ref 8.9–10.3)
GFR calc Af Amer: >60 mL/min (ref >60)
GFR calc non Af Amer: >60 mL/min (ref >60)
Glucose, Bld: 126 mg/dL — ABNORMAL HIGH (ref 65–99)
POTASSIUM: 3.9 mmol/L (ref 3.5–5.1)
SODIUM: 136 mmol/L (ref 135–145)

## 2016-10-30 LAB — CBC
HEMATOCRIT: 28.6 % — AB (ref 36.0–46.0)
Hemoglobin: 9.4 g/dL — ABNORMAL LOW (ref 12.0–15.0)
MCH: 31.4 pg (ref 26.0–34.0)
MCHC: 32.9 g/dL (ref 30.0–36.0)
MCV: 95.7 fL (ref 78.0–100.0)
Platelets: 167 10*3/uL (ref 150–400)
RBC: 2.99 MIL/uL — AB (ref 3.87–5.11)
RDW: 14 % (ref 11.5–15.5)
WBC: 9.3 10*3/uL (ref 4.0–10.5)

## 2016-10-30 NOTE — Anesthesia Postprocedure Evaluation (Signed)
Anesthesia Post Note  Patient: Vickie Hall  Procedure(s) Performed: Procedure(s) (LRB): INTRAMEDULLARY (IM) NAIL INTERTROCHANTRIC (Left)  Patient location during evaluation: PACU Anesthesia Type: General Level of consciousness: awake and alert and patient cooperative Pain management: pain level controlled Vital Signs Assessment: post-procedure vital signs reviewed and stable Respiratory status: spontaneous breathing and respiratory function stable Cardiovascular status: stable Anesthetic complications: no       Last Vitals:  Vitals:   10/30/16 0021 10/30/16 0425  BP: 125/73 132/61  Pulse: 81 74  Resp: 16 16  Temp: 37 C 36.9 C    Last Pain:  Vitals:   10/30/16 0425  TempSrc: Oral  PainSc:                  Dhamar Gregory S

## 2016-10-30 NOTE — Progress Notes (Signed)
   Subjective:  Patient reports pain as mild.    Objective:   VITALS:   Vitals:   10/29/16 1739 10/29/16 2037 10/30/16 0021 10/30/16 0425  BP: 138/70 120/62 125/73 132/61  Pulse:  84 81 74  Resp:  14 16 16   Temp:  97.9 F (36.6 C) 98.6 F (37 C) 98.4 F (36.9 C)  TempSrc:  Oral Oral Oral  SpO2:  92% 98% 97%    NAD ABD soft Sensation intact distally Intact pulses distally Dorsiflexion/Plantar flexion intact Incision: dressing C/D/I Compartment soft  LUE: splint intact. BCR, SILT fingers. (+) AIN/PIN/U.  Lab Results  Component Value Date   WBC 9.3 10/30/2016   HGB 9.4 (L) 10/30/2016   HCT 28.6 (L) 10/30/2016   MCV 95.7 10/30/2016   PLT 167 10/30/2016   BMET    Component Value Date/Time   NA 136 10/30/2016 0347   NA 143 09/28/2016 1459   K 3.9 10/30/2016 0347   CL 101 10/30/2016 0347   CO2 24 10/30/2016 0347   GLUCOSE 126 (H) 10/30/2016 0347   BUN 11 10/30/2016 0347   BUN 21 09/28/2016 1459   CREATININE 0.67 10/30/2016 0347   CALCIUM 7.7 (L) 10/30/2016 0347   GFRNONAA >60 10/30/2016 0347   GFRAA >60 10/30/2016 0347     Assessment/Plan: 1 Day Post-Op   Principal Problem:   Closed left hip fracture (HCC) Active Problems:   GERD (gastroesophageal reflux disease)   HTN (hypertension)   Hyperlipidemia   Dementia due to Alzheimer's disease   Closed pertrochanteric fracture of femur, left, initial encounter (HCC)   NWB LUE, may WB through elbow TDWB LLE DVT ppx: lovenox x30 days, SCDs, TEDs PT/OT PO pain control Dispo: SNF placement   Nayan Proch, Cloyde ReamsBrian James 10/30/2016, 8:11 AM   Samson FredericBrian Mareta Chesnut, MD Cell [phone]

## 2016-10-30 NOTE — Consult Note (Signed)
Reason for Consult:left wrist Fx Referring Physician: Swinteck  Vickie Hall is an 83 y.o. female.  HPI: 83 yo with significant dementia and medical problems as noted. Hx of recent hip ORIF after fall She is confused and denies pain in wrist at present. I have been asked to look at her left wrist and treat Chart reviewed  Past Medical History:  Diagnosis Date  . GERD (gastroesophageal reflux disease)   . Hyperlipidemia   . Hypertension   . Memory deficit   . Osteoporosis     Past Surgical History:  Procedure Laterality Date  . ABDOMINAL HYSTERECTOMY    . EYE SURGERY      Family History  Problem Relation Age of Onset  . Family history unknown: Yes    Social History:  reports that she has never smoked. She has never used smokeless tobacco. She reports that she does not drink alcohol or use drugs.  Allergies:  Allergies  Allergen Reactions  . Actonel [Risedronate Sodium] Other (See Comments)  . Lipitor [Atorvastatin] Other (See Comments)    Medications reviewde  Results for orders placed or performed during the hospital encounter of 10/28/16 (from the past 44 hour(s))  MRSA PCR Screening     Status: None   Collection Time: 10/29/16  6:37 AM  Result Value Ref Range   MRSA by PCR NEGATIVE NEGATIVE    Comment:        The GeneXpert MRSA Assay (FDA approved for NASAL specimens only), is one component of a comprehensive MRSA colonization surveillance program. It is not intended to diagnose MRSA infection nor to guide or monitor treatment for MRSA infections.   CBC     Status: Abnormal   Collection Time: 10/30/16  3:47 AM  Result Value Ref Range   WBC 9.3 4.0 - 10.5 K/uL   RBC 2.99 (L) 3.87 - 5.11 MIL/uL   Hemoglobin 9.4 (L) 12.0 - 15.0 g/dL    Comment: REPEATED TO VERIFY DELTA CHECK NOTED    HCT 28.6 (L) 36.0 - 46.0 %   MCV 95.7 78.0 - 100.0 fL   MCH 31.4 26.0 - 34.0 pg   MCHC 32.9 30.0 - 36.0 g/dL   RDW 14.0 11.5 - 15.5 %   Platelets 167 150 - 400 K/uL   Basic metabolic panel     Status: Abnormal   Collection Time: 10/30/16  3:47 AM  Result Value Ref Range   Sodium 136 135 - 145 mmol/L   Potassium 3.9 3.5 - 5.1 mmol/L   Chloride 101 101 - 111 mmol/L   CO2 24 22 - 32 mmol/L   Glucose, Bld 126 (H) 65 - 99 mg/dL   BUN 11 6 - 20 mg/dL   Creatinine, Ser 0.67 0.44 - 1.00 mg/dL   Calcium 7.7 (L) 8.9 - 10.3 mg/dL   GFR calc non Af Amer >60 >60 mL/min   GFR calc Af Amer >60 >60 mL/min    Comment: (NOTE) The eGFR has been calculated using the CKD EPI equation. This calculation has not been validated in all clinical situations. eGFR's persistently <60 mL/min signify possible Chronic Kidney Disease.    Anion gap 11 5 - 15    Dg Wrist Complete Left  Result Date: 10/30/2016 CLINICAL DATA:  Recent radius fracture EXAM: LEFT WRIST - COMPLETE 3+ VIEW COMPARISON:  October 28, 2016 FINDINGS: Frontal, oblique, and lateral views were obtained with immobilization device in place. Comminuted fracture of the distal radial metaphysis is again noted with fracture fragment extending  into the radiocarpal joint. There is slight volar angulation of distal major fracture fragments. No new fracture. No dislocation. There is extensive arthropathy in the first carpal-metacarpal joint. There is milder osteoarthritic change in the scaphotrapezial and proximal carpal row lesions. Osteoarthritic changes also noted in all visualized PIP and DIP joints. IMPRESSION: Comminuted fracture distal radial metaphysis with alignment essentially stable compared to 2 days prior. Note that fracture fragment extends into the radiocarpal joint with mild displacement of fracture fragments in this area, stable. No new fracture. No dislocation. Osteoarthritic change, most marked in the first carpal -metacarpal joint region. Electronically Signed   By: Lowella Grip III M.D.   On: 10/30/2016 21:48   Pelvis Portable  Result Date: 10/29/2016 CLINICAL DATA:  Post internal fixation EXAM:  PORTABLE PELVIS 1 VIEW COMPARISON:  the previous day's study FINDINGS: Interval placement of IM rod and sliding screw across a comminuted left intertrochanteric fracture, major fracture fragments in near anatomic alignment. There is persistent medial displacement of the inferior trochanter fracture fragment. No new fracture or dislocation. Distal aspect of IM rod not visualized. Stable changes of right hip osteoarthritis. IMPRESSION: 1. Internal fixation of left intertrochanteric femur fracture without apparent complication. Electronically Signed   By: Lucrezia Europe M.D.   On: 10/29/2016 17:41   Dg C-arm 1-60 Min  Result Date: 10/29/2016 CLINICAL DATA:  Placement of left femoral nail.  Initial encounter. EXAM: LEFT FEMUR 2 VIEWS COMPARISON:  Left hip radiographs performed 10/28/2016 FINDINGS: Four fluoroscopic C-arm images are provided from the OR, demonstrating placement of a left femoral intramedullary nail and screw, with fixation of left femoral intertrochanteric fracture in grossly anatomic alignment. No new fracture is seen. The left femoral head remains seated at the acetabulum. Scattered vascular calcifications are seen. IMPRESSION: Status post internal fixation of left femoral intertrochanteric fracture in grossly anatomic alignment. Electronically Signed   By: Garald Balding M.D.   On: 10/29/2016 17:04   Dg Femur Min 2 Views Left  Result Date: 10/29/2016 CLINICAL DATA:  Placement of left femoral nail.  Initial encounter. EXAM: LEFT FEMUR 2 VIEWS COMPARISON:  Left hip radiographs performed 10/28/2016 FINDINGS: Four fluoroscopic C-arm images are provided from the OR, demonstrating placement of a left femoral intramedullary nail and screw, with fixation of left femoral intertrochanteric fracture in grossly anatomic alignment. No new fracture is seen. The left femoral head remains seated at the acetabulum. Scattered vascular calcifications are seen. IMPRESSION: Status post internal fixation of left  femoral intertrochanteric fracture in grossly anatomic alignment. Electronically Signed   By: Garald Balding M.D.   On: 10/29/2016 17:04   Dg Femur Port Min 2 Views Left  Result Date: 10/29/2016 CLINICAL DATA:  Left hip fracture. EXAM: LEFT FEMUR PORTABLE 2 VIEWS COMPARISON:  Radiographs dated 10/28/2016 and 10/29/2016 FINDINGS: The patient has undergone insertion of an intramedullary nail and lack screw across the comminuted proximal femur fracture. Distal fixation screw and present in the femoral shaft. The hardware appears in good position with near anatomic alignment and position of the fracture fragments. IMPRESSION: Satisfactory appearance of the left hip after open reduction and internal fixation. Electronically Signed   By: Lorriane Shire M.D.   On: 10/29/2016 17:41    ROS Blood pressure (!) 137/58, pulse 81, temperature 98.9 F (37.2 C), temperature source Oral, resp. rate 16, SpO2 93 %. Physical Exam Fracture of left wrist with bruising and no open wounds Normal pulse and sensate No acute CTS Elbow stable  Skin stable Xrays reviwed RUE NVI  without deformity Assessment/Plan: Left displaced intraarticular wrist fracture  New splint placed with gentle reduction Will repeat xrays and discuss with family  Patient Active Problem List   Diagnosis Date Noted  . Closed pertrochanteric fracture of femur, left, initial encounter ([place]) 10/29/2016  . Closed left hip fracture ([place]) 10/28/2016  . Dementia due to Alzheimer's disease 10/28/2016  . Aortic atherosclerosis ([place]) 05/29/2016  . Osteoporosis, senile 12/16/2013  . GERD (gastroesophageal reflux disease) 11/11/2013  . HTN (hypertension) 11/11/2013  . Hyperlipidemia 11/11/2013  . Memory deficit 11/11/2013    Denice Bors M 10/30/2016, 11:59 PM

## 2016-10-30 NOTE — Evaluation (Signed)
Physical Therapy Evaluation Patient Details Name: LOUNELL SCHUMACHER MRN: [id number] DOB: [birth year] Today's Date: 10/30/2016   History of Present Illness  Vickie Hall is a 83 y.o. female, resident of [address] [facility], ambulate short distances in the ALF without the help of a cane or a walker, dependent on activities of daily living, PMH of fairly advanced dementia, HTN, HLD, vitamin D deficiency, osteoporosis presented to the ED following a mechanical fall at the facility followed by left-sided hip and wrist pain. Now s/p IM Nail fixation L femur, 30% PWB; wrist fracture with splint to be managed non-operatively, may weight bear through elbow  Clinical Impression   Patient is s/p above surgery resulting in functional limitations due to the deficits listed below (see PT Problem List). Requires total assist of 2 for all aspects of mobility;  Patient will benefit from skilled PT to increase their independence and safety with mobility to allow discharge to the venue listed below.       Follow Up Recommendations SNF    Equipment Recommendations  Other (comment) (TBD at SNF)    Recommendations for Other Services OT consult     Precautions / Restrictions Precautions Precautions: Fall Restrictions LUE Weight Bearing: Weight bear through elbow only LLE Weight Bearing: Partial weight bearing LLE Partial Weight Bearing Percentage or Pounds: 30      Mobility  Bed Mobility Overal bed mobility: Needs Assistance Bed Mobility: Supine to Sit     Supine to sit: Total assist;+2 for physical assistance     General bed mobility comments: Total assist for all aspects of bed mobility; used bed pad to cradle hips coming to EOB  Transfers Overall transfer level: Needs assistance   Transfers: Lateral/Scoot Transfers          Lateral/Scoot Transfers: Total assist;+2 physical assistance;+2 safety/equipment General transfer comment: performed lateral scoot transfer to recliner on pt's R side with  armrest down with +2 total assist and use of bed pad to cradle hips during scoot  Ambulation/Gait                Stairs            Wheelchair Mobility    Modified Rankin (Stroke Patients Only)       Balance                                             Pertinent Vitals/Pain Pain Assessment: Faces Faces Pain Scale: Hurts whole lot Pain Location: L hip with any motion Pain Descriptors / Indicators: Crying;Grimacing;Guarding Pain Intervention(s): Monitored during session;RN gave pain meds during session    Home Living Family/patient expects to be discharged to:: Skilled nursing facility                      Prior Function Level of Independence: Needs assistance         Comments: Will need more info re: PLOF     Hand Dominance        Extremity/Trunk Assessment   Upper Extremity Assessment Upper Extremity Assessment: LUE deficits/detail LUE Deficits / Details: Limited shoulder strength with elevation; elbow/wrist immobilized in brace; noted edema in fingers; briefly performed massage; able to flex/extend all digits, though limited    Lower Extremity Assessment Lower Extremity Assessment: LLE deficits/detail;Generalized weakness LLE Deficits / Details: Significant pain with any motion of L hip LLE:  Unable to fully assess due to pain       Communication   Communication: No difficulties  Cognition Arousal/Alertness: Awake/alert Behavior During Therapy: Anxious Overall Cognitive Status: No family/caregiver present to determine baseline cognitive functioning                      General Comments      Exercises     Assessment/Plan    PT Assessment Patient needs continued PT services  PT Problem List Decreased strength;Decreased range of motion;Decreased activity tolerance;Decreased balance;Decreased mobility;Decreased coordination;Decreased cognition;Decreased knowledge of use of DME;Decreased safety  awareness;Decreased knowledge of precautions;Pain          PT Treatment Interventions DME instruction;Functional mobility training;Therapeutic activities;Therapeutic exercise;Balance training;Neuromuscular re-education;Cognitive remediation;Patient/family education    PT Goals (Current goals can be found in the Care Plan section)  Acute Rehab PT Goals Patient Stated Goal: Unable to state PT Goal Formulation: Patient unable to participate in goal setting Time For Goal Achievement: 11/13/16 Potential to Achieve Goals: Good    Frequency Min 3X/week   Barriers to discharge        Co-evaluation               End of Session Equipment Utilized During Treatment: Gait belt Activity Tolerance: Patient limited by pain Patient left: in chair;with call bell/phone within reach;with chair alarm set Nurse Communication: Mobility status         Time: [id number] PT Time Calculation (min) (ACUTE ONLY): 19 min   Charges:   PT Evaluation $PT Eval Moderate Complexity: 1 Procedure     PT G CodesLevi Aland:        Nichoel Digiulio H Lamiah Marmol 10/30/2016, 4:34 PM  Van ClinesHolly Lavonte Palos, PT  Acute Rehabilitation Services Pager [phone] Office [phone]

## 2016-10-30 NOTE — NC FL2 (Addendum)
[place] MEDICAID FL2 LEVEL OF CARE SCREENING TOOL     IDENTIFICATION  Patient Name: Vickie Hall Birthdate: [birth year] Sex: female Admission Date (Current Location): 10/28/2016  [facility] and [place] Number:  Producer, television/film/video and Address:  [facility], [address] [place], [place] [address]      Provider Number: [id number]  Attending Physician Name and Address:  Starleen Arms, MD  Relative Name and Phone Number:       Current Level of Care: Hospital Recommended Level of Care: Assisted Living Facility Prior Approval Number:    Date Approved/Denied:   PASRR Number:   Discharge Plan: ALF   Current Diagnoses: Patient Active Problem List   Diagnosis Date Noted  . Closed pertrochanteric fracture of femur, left, initial encounter ([facility]) 10/29/2016  . Closed left hip fracture ([facility]) 10/28/2016  . Dementia due to Alzheimer's disease 10/28/2016  . Aortic atherosclerosis ([facility]) 05/29/2016  . Osteoporosis, senile 12/16/2013  . GERD (gastroesophageal reflux disease) 11/11/2013  . HTN (hypertension) 11/11/2013  . Hyperlipidemia 11/11/2013  . Memory deficit 11/11/2013    Orientation RESPIRATION BLADDER Height & Weight     Self  O2 (Nasal Cannula, 2L) Continent Weight:   Height:     BEHAVIORAL SYMPTOMS/MOOD NEUROLOGICAL BOWEL NUTRITION STATUS      Continent  (Please see discharge summary)  AMBULATORY STATUS COMMUNICATION OF NEEDS Skin   Limited Assist Verbally Surgical wounds (Closed incision left leg, Hydrocolloid dressing)                       Personal Care Assistance Level of Assistance  Bathing, Feeding, Dressing Bathing Assistance: Limited assistance Feeding assistance: Independent Dressing Assistance: Limited assistance     Functional Limitations Info  Sight, Hearing, Speech Sight Info: Adequate Hearing Info: Adequate Speech Info: Adequate    SPECIAL CARE FACTORS FREQUENCY  PT (By licensed PT), OT (By licensed OT)     PT Frequency: 3x week OT Frequency: 3x week            Contractures Contractures Info: Not present    Additional Factors Info  Code Status, Allergies Code Status Info: DNR Allergies Info: Actonel Risedronate Sodium, Lipitor Atorvastatin           Current Medications (10/30/2016):  This is the current hospital active medication list Current Facility-Administered Medications  Medication Dose Route Frequency Provider Last Rate Last Dose  . 0.9 %  sodium chloride infusion   Intravenous Continuous Elease Etienne, MD 50 mL/hr at 10/29/16 1749    . acetaminophen (TYLENOL) tablet 650 mg  650 mg Oral Q6H PRN Elease Etienne, MD       Or  . acetaminophen (TYLENOL) suppository 650 mg  650 mg Rectal Q6H PRN Elease Etienne, MD      . albuterol (PROVENTIL) (2.5 MG/3ML) 0.083% nebulizer solution 2.5 mg  2.5 mg Nebulization Q2H PRN Elease Etienne, MD      . memantine (NAMENDA XR) 24 hr capsule 28 mg  28 mg Oral QHS Elease Etienne, MD   28 mg at 10/29/16 2154   And  . donepezil (ARICEPT) tablet 10 mg  10 mg Oral QHS Elease Etienne, MD   10 mg at 10/29/16 2154  . enoxaparin (LOVENOX) injection 40 mg  40 mg Subcutaneous Q24H Samson Frederic, MD   40 mg at 10/30/16 1004  . ezetimibe (ZETIA) tablet 10 mg  10 mg Oral Daily Elease Etienne, MD  10 mg at 10/30/16 1004  . HYDROcodone-acetaminophen (NORCO/VICODIN) 5-325 MG per tablet 1-2 tablet  1-2 tablet Oral Q6H PRN Samson FredericBrian Swinteck, MD      . irbesartan (AVAPRO) tablet 150 mg  150 mg Oral Daily Elease EtienneAnand D Hongalgi, MD   150 mg at 10/30/16 1004  . ketorolac (TORADOL) 15 MG/ML injection 7.5 mg  7.5 mg Intravenous Q8H PRN Elease EtienneAnand D Hongalgi, MD   7.5 mg at 10/29/16 0917  . lactated ringers infusion   Intravenous Continuous Mal AmabileMichael Foster, MD      . lactated ringers infusion   Intravenous Continuous Mal AmabileMichael Foster, MD   Stopped at 10/29/16 1659  . menthol-cetylpyridinium (CEPACOL) lozenge 3 mg  1 lozenge Oral PRN Samson FredericBrian Swinteck, MD       Or  .  phenol (CHLORASEPTIC) mouth spray 1 spray  1 spray Mouth/Throat PRN Samson FredericBrian Swinteck, MD      . metoCLOPramide (REGLAN) tablet 5-10 mg  5-10 mg Oral Q8H PRN Samson FredericBrian Swinteck, MD       Or  . metoCLOPramide (REGLAN) injection 5-10 mg  5-10 mg Intravenous Q8H PRN Samson FredericBrian Swinteck, MD      . morphine 2 MG/ML injection 0.5 mg  0.5 mg Intravenous Q2H PRN Samson FredericBrian Swinteck, MD      . ondansetron (ZOFRAN) tablet 4 mg  4 mg Oral Q6H PRN Elease EtienneAnand D Hongalgi, MD       Or  . ondansetron (ZOFRAN) injection 4 mg  4 mg Intravenous Q6H PRN Elease EtienneAnand D Hongalgi, MD      . oxyCODONE (Oxy IR/ROXICODONE) immediate release tablet 5 mg  5 mg Oral Q6H PRN Elease EtienneAnand D Hongalgi, MD         Discharge Medications: Please see discharge summary for a list of discharge medications.  Relevant Imaging Results:  Relevant Lab Results:   Additional Information SSN: [id number]  [place] AmericanBridget A Mayton, LCSW

## 2016-10-30 NOTE — Clinical Social Work Note (Signed)
Clinical Social Work Assessment  Patient Details  Name: Vickie SnowballHelen M Hall MRN: [id number] Date of Birth: May 26, 1928  Date of referral:  10/30/16               Reason for consult:  Facility Placement                Permission sought to share information with:    Permission granted to share information::     Name::        Agency::     Relationship::     Contact Information:     Housing/Transportation Living arrangements for the past 2 months:  Assisted Living Facility Source of Information:  Adult Children Patient Interpreter Needed:  None Criminal Activity/Legal Involvement Pertinent to Current Situation/Hospitalization:  No - Comment as needed Significant Relationships:  Adult Children Lives with:  Facility Resident Do you feel safe going back to the place where you live?    Need for family participation in patient care:     Care giving concerns:  Pt is only alert to self. CSW contacted pt's son.   Social Worker assessment / plan:  CSW spoke with pt's sone via telephone. [facility] Pt's [facility] CSW sent referral and will follow up with facility. CSW will initiate insurance authorization.   Employment status:  Retired Database administratornsurance information:  Managed Medicare PT Recommendations:  Skilled Nursing Facility Information / Referral to community resources:  Skilled Nursing Facility  Patient/Family's Response to care:  Pt's son verbalized understanding of CSW role and expressed appreciation for support. Pt's son denies any concern regarding pt care at this time.   Patient/Family's Understanding of and Emotional Response to Diagnosis, Current Treatment, and Prognosis:  Pt's son understanding and realistic regarding physical limitations. Pt's son understands the need for SNF placement at d/c. Pt's son agreeable to SNF placement at d/c, at this time.  Pt's son denies any concern regarding treatment  plan at this time. CSW will continue to provide support and facilitate d/c needs.   Emotional Assessment Appearance:  Appears stated age Attitude/Demeanor/Rapport:  Unable to Assess Affect (typically observed):  Unable to Assess Orientation:  Oriented to Self Alcohol / Substance use:  Not Applicable Psych involvement (Current and /or in the community):  No (Comment)  Discharge Needs  Concerns to be addressed:  No discharge needs identified Readmission within the last 30 days:  No Current discharge risk:  Dependent with Mobility Barriers to Discharge:  Continued Medical Work up   Safeway IncBridget A Mayton, LCSW 10/30/2016, 9:59 AM

## 2016-10-30 NOTE — Progress Notes (Signed)
PROGRESS NOTE  Vickie Hall  ZOX:[id number] DOB: 11-Apr-1928  DOA: 10/28/2016 PCP: Rudi Heap, MD   Brief Narrative:  83 y.o. female, resident of [address] [facility], ambulate short distances in the ALF without the help of a cane or a walker, dependent on activities of daily living, PMH of fairly advanced dementia, HTN, HLD, vitamin D deficiency, osteoporosis presented to the ED following a mechanical fall at the facility followed by left-sided hip and wrist pain. She was initially taken to [facility] ED and then transferred to [facility] for evaluation and management by orthopedics. Scheduled for surgical fixation of left hip fracture on 10/29/16 afternoon.   Assessment & Plan:   Principal Problem:   Closed left hip fracture (HCC) Active Problems:   GERD (gastroesophageal reflux disease)   HTN (hypertension)   Hyperlipidemia   Dementia due to Alzheimer's disease   Closed pertrochanteric fracture of femur, left, initial encounter (HCC)    1. Acute closed left hip fracture (communited intertrochanteric fracture with mild displacement): Orthopedic consult greatly appreciated, went for surgical repair 2/12, PT consult pending, continue with when necessary pain medication, will need DVT prophylaxis for the next 30 days. 2. Left wrist fracture: Orthopedics consulted and as per their recommendation, will be managed nonoperatively with a splint. 3. Postoperative anemia: Hemoglobin 9.4 today, expected postoperative drop, continue to monitor, check CBC in a.m. 4. Essential hypertension:  reasonable inpatient control. Continue ARB. 5. Hyperlipidemia: Continue Zetia. 6. Advanced dementia: As per son, Alzheimer's dementia. No behavioral abnormalities. Continue home dose of Namzaric. 7. Osteoporosis and vitamin D deficiency: Hold medications while hospitalized and may resume at discharge. 8. Sinus bradycardia:  TSH normal. Repeat EKG on 10/28/16 shows sinus rhythm at 62 bpm with few  PACs, normal axis, and no acute changes. QTC 439 ms. She is asymptomatic. 9. Leukocytosis: Possibly stress response. No symptoms or signs suggestive of infection. Chest x-ray and urine microscopy unremarkable. Improved.     DVT prophylaxis: Lovenox Code Status: DO NOT RESUSCITATE Family Communication: none at bedside Disposition Plan: Likely to SNF when medically stable, pending PT consult   Consultants:   Orthopedics  Procedures:   Intramedullary fixation of left femur by Dr. Linna Caprice on 10/29/2016  Antimicrobials:   None    Subjective: Seen this morning. Denied complaints. Denied pain. Denies any shortness of breath, nausea vomiting or headache Objective:  Vitals:   10/29/16 1739 10/29/16 2037 10/30/16 0021 10/30/16 0425  BP: 138/70 120/62 125/73 132/61  Pulse:  84 81 74  Resp:  14 16 16   Temp:  97.9 F (36.6 C) 98.6 F (37 C) 98.4 F (36.9 C)  TempSrc:  Oral Oral Oral  SpO2:  92% 98% 97%    Intake/Output Summary (Last 24 hours) at 10/30/16 1303 Last data filed at 10/30/16 0930  Gross per 24 hour  Intake             1655 ml  Output              800 ml  Net              855 ml   There were no vitals filed for this visit.  Examination:  General exam: Pleasant elderly female lying comfortably supine in bed. Son at bedside. Respiratory system: Clear to auscultation. Respiratory effort normal. Cardiovascular system: S1 & S2 heard, RRR. No JVD, murmurs, rubs, gallops or clicks. No pedal edema. Gastrointestinal system: Abdomen is nondistended, soft and nontender. No organomegaly or masses felt. Normal  bowel sounds heard. Central nervous system: Alert and oriented only to self. No focal neurological deficits. Extremities:  Left forearm in a splint. Good pulses, no cyanosis in bilateral lower extremities Skin: No rashes, lesions or ulcers Psychiatry: Judgement and insight impaired. Mood & affect appropriate.     Data Reviewed: I have personally reviewed  following labs and imaging studies  CBC:  Recent Labs Lab 10/28/16 1910 10/30/16 0347  WBC 12.0* 9.3  NEUTROABS 9.7*  --   HGB 13.5 9.4*  HCT 41.3 28.6*  MCV 96.7 95.7  PLT 232 167   Basic Metabolic Panel:  Recent Labs Lab 10/28/16 1910 10/30/16 0347  NA 133* 136  K 4.1 3.9  CL 99* 101  CO2 25 24  GLUCOSE 155* 126*  BUN 12 11  CREATININE 0.74 0.67  CALCIUM 8.6* 7.7*   GFR: CrCl cannot be calculated (Unknown ideal weight.). Liver Function Tests: No results for input(s): AST, ALT, ALKPHOS, BILITOT, PROT, ALBUMIN in the last 168 hours. No results for input(s): LIPASE, AMYLASE in the last 168 hours. No results for input(s): AMMONIA in the last 168 hours. Coagulation Profile: No results for input(s): INR, PROTIME in the last 168 hours. Cardiac Enzymes: No results for input(s): CKTOTAL, CKMB, CKMBINDEX, TROPONINI in the last 168 hours. BNP (last 3 results) No results for input(s): PROBNP in the last 8760 hours. HbA1C: No results for input(s): HGBA1C in the last 72 hours. CBG: No results for input(s): GLUCAP in the last 168 hours. Lipid Profile: No results for input(s): CHOL, HDL, LDLCALC, TRIG, CHOLHDL, LDLDIRECT in the last 72 hours. Thyroid Function Tests:  Recent Labs  10/28/16 1910  TSH 1.627   Anemia Panel: No results for input(s): VITAMINB12, FOLATE, FERRITIN, TIBC, IRON, RETICCTPCT in the last 72 hours.  Sepsis Labs:  Recent Labs Lab 10/28/16 1910 10/30/16 0347  WBC 12.0* 9.3    Recent Results (from the past 240 hour(s))  MRSA PCR Screening     Status: None   Collection Time: 10/29/16  6:37 AM  Result Value Ref Range Status   MRSA by PCR NEGATIVE NEGATIVE Final    Comment:        The GeneXpert MRSA Assay (FDA approved for NASAL specimens only), is one component of a comprehensive MRSA colonization surveillance program. It is not intended to diagnose MRSA infection nor to guide or monitor treatment for MRSA infections.           Radiology Studies: Dg Chest 1 View  Result Date: 10/28/2016 CLINICAL DATA:  Fall, hip fracture EXAM: CHEST 1 VIEW COMPARISON:  10/28/2016 at 1052 hours FINDINGS: Lungs are clear.  No pleural effusion or pneumothorax. The heart is normal in size. Thoracic aortic atherosclerosis. IMPRESSION: No evidence of acute cardiopulmonary disease. Thoracic aortic atherosclerosis. Electronically Signed   By: Charline BillsSriyesh  Krishnan M.D.   On: 10/28/2016 17:38   Dg Wrist Complete Left  Result Date: 10/28/2016 CLINICAL DATA:  Status post fall, interval cast placement EXAM: LEFT WRIST - COMPLETE 3+ VIEW COMPARISON:  Same day FINDINGS: Comminuted distal radial metaphysis fracture with a fracture cleft extending to the radiocarpal joint surface. No significant change in the overall alignment. Nondisplaced ulnar styloid process fracture. Severe osteoarthritis of the first [facility] joint. Mild osteoarthritis of the scaphotrapeziotrapezoid joint. IMPRESSION: Comminuted distal radial metaphysis fracture with a fracture cleft extending to the radiocarpal joint surface. No significant change in the overall alignment. Nondisplaced ulnar styloid process fracture. Electronically Signed   By: Elige KoHetal  Patel   On: 10/28/2016 17:42  Pelvis Portable  Result Date: 10/29/2016 CLINICAL DATA:  Post internal fixation EXAM: PORTABLE PELVIS 1 VIEW COMPARISON:  the previous day's study FINDINGS: Interval placement of IM rod and sliding screw across a comminuted left intertrochanteric fracture, major fracture fragments in near anatomic alignment. There is persistent medial displacement of the inferior trochanter fracture fragment. No new fracture or dislocation. Distal aspect of IM rod not visualized. Stable changes of right hip osteoarthritis. IMPRESSION: 1. Internal fixation of left intertrochanteric femur fracture without apparent complication. Electronically Signed   By: Corlis Leak M.D.   On: 10/29/2016 17:41   Dg C-arm 1-60 Min  Result  Date: 10/29/2016 CLINICAL DATA:  Placement of left femoral nail.  Initial encounter. EXAM: LEFT FEMUR 2 VIEWS COMPARISON:  Left hip radiographs performed 10/28/2016 FINDINGS: Four fluoroscopic C-arm images are provided from the OR, demonstrating placement of a left femoral intramedullary nail and screw, with fixation of left femoral intertrochanteric fracture in grossly anatomic alignment. No new fracture is seen. The left femoral head remains seated at the acetabulum. Scattered vascular calcifications are seen. IMPRESSION: Status post internal fixation of left femoral intertrochanteric fracture in grossly anatomic alignment. Electronically Signed   By: Roanna Raider M.D.   On: 10/29/2016 17:04   Dg Hip Unilat W Or Wo Pelvis 2-3 Views Left  Result Date: 10/28/2016 CLINICAL DATA:  Status post fall.  Left hip pain EXAM: DG HIP (WITH OR WITHOUT PELVIS) 2-3V LEFT COMPARISON:  None. FINDINGS: Severe osteopenia. Comminuted left intertrochanteric fracture with mild displacement. No other acute fracture or dislocation. No aggressive osseous lesion. Mild osteoarthritis of bilateral sacroiliac joints. IMPRESSION: Acute comminuted left intertrochanteric fracture. Electronically Signed   By: Elige Ko   On: 10/28/2016 17:37   Dg Femur Min 2 Views Left  Result Date: 10/29/2016 CLINICAL DATA:  Placement of left femoral nail.  Initial encounter. EXAM: LEFT FEMUR 2 VIEWS COMPARISON:  Left hip radiographs performed 10/28/2016 FINDINGS: Four fluoroscopic C-arm images are provided from the OR, demonstrating placement of a left femoral intramedullary nail and screw, with fixation of left femoral intertrochanteric fracture in grossly anatomic alignment. No new fracture is seen. The left femoral head remains seated at the acetabulum. Scattered vascular calcifications are seen. IMPRESSION: Status post internal fixation of left femoral intertrochanteric fracture in grossly anatomic alignment. Electronically Signed   By: Roanna Raider M.D.   On: 10/29/2016 17:04   Dg Femur Port Min 2 Views Left  Result Date: 10/29/2016 CLINICAL DATA:  Left hip fracture. EXAM: LEFT FEMUR PORTABLE 2 VIEWS COMPARISON:  Radiographs dated 10/28/2016 and 10/29/2016 FINDINGS: The patient has undergone insertion of an intramedullary nail and lack screw across the comminuted proximal femur fracture. Distal fixation screw and present in the femoral shaft. The hardware appears in good position with near anatomic alignment and position of the fracture fragments. IMPRESSION: Satisfactory appearance of the left hip after open reduction and internal fixation. Electronically Signed   By: Francene Boyers M.D.   On: 10/29/2016 17:41        Scheduled Meds: . memantine  28 mg Oral QHS   And  . donepezil  10 mg Oral QHS  . enoxaparin (LOVENOX) injection  40 mg Subcutaneous Q24H  . ezetimibe  10 mg Oral Daily  . irbesartan  150 mg Oral Daily   Continuous Infusions: . lactated ringers    . lactated ringers Stopped (10/29/16 1659)     LOS: 2 days        Aldine Chakraborty, MD [facility]  Pager [phone]  If 7PM-7AM, please contact night-coverage [email or web address] Password [email or web address] 10/30/2016, 1:03 PM

## 2016-10-31 ENCOUNTER — Encounter (HOSPITAL_COMMUNITY): Payer: Self-pay | Admitting: Orthopedic Surgery

## 2016-10-31 DIAGNOSIS — S62102D Fracture of unspecified carpal bone, left wrist, subsequent encounter for fracture with routine healing: Secondary | ICD-10-CM

## 2016-10-31 LAB — BASIC METABOLIC PANEL
ANION GAP: 11 (ref 5–15)
BUN: 11 mg/dL (ref 6–20)
CHLORIDE: 100 mmol/L — AB (ref 101–111)
CO2: 22 mmol/L (ref 22–32)
Calcium: 8 mg/dL — ABNORMAL LOW (ref 8.9–10.3)
Creatinine, Ser: 0.62 mg/dL (ref 0.44–1.00)
GFR calc non Af Amer: >60 mL/min (ref >60)
Glucose, Bld: 119 mg/dL — ABNORMAL HIGH (ref 65–99)
Potassium: 3.6 mmol/L (ref 3.5–5.1)
SODIUM: 133 mmol/L — AB (ref 135–145)

## 2016-10-31 LAB — CBC
HEMATOCRIT: 28.5 % — AB (ref 36.0–46.0)
HEMOGLOBIN: 9.6 g/dL — AB (ref 12.0–15.0)
MCH: 31.9 pg (ref 26.0–34.0)
MCHC: 33.7 g/dL (ref 30.0–36.0)
MCV: 94.7 fL (ref 78.0–100.0)
Platelets: 151 10*3/uL (ref 150–400)
RBC: 3.01 MIL/uL — ABNORMAL LOW (ref 3.87–5.11)
RDW: 13.7 % (ref 11.5–15.5)
WBC: 8.9 10*3/uL (ref 4.0–10.5)

## 2016-10-31 NOTE — Progress Notes (Signed)
PROGRESS NOTE  Vickie Hall  ZOX:[id number] DOB: Jan 11, 1928  DOA: 10/28/2016 PCP: Rudi Heap, MD   Brief Narrative:  83 y.o. female, resident of [address] [facility], ambulate short distances in the ALF without the help of a cane or a walker, dependent on activities of daily living, PMH of fairly advanced dementia, HTN, HLD, vitamin D deficiency, osteoporosis presented to the ED following a mechanical fall at the facility followed by left-sided hip and wrist pain. She was initially taken to [facility] ED and then transferred to [facility] for evaluation and management by orthopedics. Went for surgical fixation of left hip fracture on 10/29/16 afternoon.   Assessment & Plan:   Principal Problem:   Closed left hip fracture (HCC) Active Problems:   GERD (gastroesophageal reflux disease)   HTN (hypertension)   Hyperlipidemia   Dementia due to Alzheimer's disease   Closed pertrochanteric fracture of femur, left, initial encounter (HCC)    1. Acute closed left hip fracture (communited intertrochanteric fracture with mild displacement): Orthopedic consult greatly appreciated, went for surgical repair 2/12, PT consult pending, continue with when necessary pain medication, will need DVT prophylaxis for the next 30 days. 2. Left wrist fracture: hand Surgery consult greatly appreciated, discussed with Dr. Cliffton Asters, he will discuss with family, as patient may need surgical repair . 3. Postoperative anemia: Hemoglobin 9.4 today, expected postoperative drop, continue to monitor, check CBC in a.m. 4. Essential hypertension:  reasonable inpatient control. Continue ARB. 5. Hyperlipidemia: Continue Zetia. 6. Advanced dementia: As per son, Alzheimer's dementia. No behavioral abnormalities. Continue home dose of Namzaric. 7. Osteoporosis and vitamin D deficiency: Hold medications while hospitalized and may resume at discharge. 8. Sinus bradycardia:  TSH normal. Repeat EKG on 10/28/16 shows  sinus rhythm at 62 bpm with few PACs, normal axis, and no acute changes. QTC 439 ms. She is asymptomatic. 9. Leukocytosis: Possibly stress response. No symptoms or signs suggestive of infection. Chest x-ray and urine microscopy unremarkable. Improved.     DVT prophylaxis: Lovenox Code Status: DO NOT RESUSCITATE Family Communication: none at bedside Disposition Plan: to SNF, awaiting decision about possible need of left wrist fracture   Consultants:   Orthopedics Dr Veda Canning  Hand surgery Dr Cliffton Asters  Procedures:   Intramedullary fixation of left femur by Dr. Linna Caprice on 10/29/2016  Antimicrobials:   None    Subjective: Seen this morning. Denied complaints. Denied pain. Denies any shortness of breath, nausea vomiting or headache Objective:  Vitals:   10/30/16 2340 10/31/16 0400 10/31/16 0952 10/31/16 1325  BP: (!) 137/58 (!) 133/58 126/69 (!) 112/46  Pulse: 81 74 70 60  Resp: 16 16 20 17   Temp: 98.9 F (37.2 C) 98.8 F (37.1 C) 98 F (36.7 C) 98.3 F (36.8 C)  TempSrc: Oral Oral Oral Oral  SpO2: 93% 91% 92% 91%    Intake/Output Summary (Last 24 hours) at 10/31/16 1356 Last data filed at 10/31/16 1300  Gross per 24 hour  Intake             1080 ml  Output              200 ml  Net              880 ml   There were no vitals filed for this visit.  Examination:  General exam: Pleasant elderly female lying comfortably supine in bed. Son at bedside. Respiratory system: Clear to auscultation. Respiratory effort normal. Cardiovascular system: S1 & S2 heard, RRR. No JVD,  murmurs, rubs, gallops or clicks. No pedal edema. Gastrointestinal system: Abdomen is nondistended, soft and nontender. No organomegaly or masses felt. Normal bowel sounds heard. Central nervous system: Alert and oriented only to self. No focal neurological deficits. Extremities:  Left forearm in a splint. Good pulses, no cyanosis in bilateral lower extremities Skin: No rashes, lesions or  ulcers Psychiatry: Judgement and insight impaired. Mood & affect appropriate.     Data Reviewed: I have personally reviewed following labs and imaging studies  CBC:  Recent Labs Lab 10/28/16 1910 10/30/16 0347 10/31/16 0436  WBC 12.0* 9.3 8.9  NEUTROABS 9.7*  --   --   HGB 13.5 9.4* 9.6*  HCT 41.3 28.6* 28.5*  MCV 96.7 95.7 94.7  PLT 232 167 151   Basic Metabolic Panel:  Recent Labs Lab 10/28/16 1910 10/30/16 0347 10/31/16 0436  NA 133* 136 133*  K 4.1 3.9 3.6  CL 99* 101 100*  CO2 25 24 22   GLUCOSE 155* 126* 119*  BUN 12 11 11   CREATININE 0.74 0.67 0.62  CALCIUM 8.6* 7.7* 8.0*   GFR: CrCl cannot be calculated (Unknown ideal weight.). Liver Function Tests: No results for input(s): AST, ALT, ALKPHOS, BILITOT, PROT, ALBUMIN in the last 168 hours. No results for input(s): LIPASE, AMYLASE in the last 168 hours. No results for input(s): AMMONIA in the last 168 hours. Coagulation Profile: No results for input(s): INR, PROTIME in the last 168 hours. Cardiac Enzymes: No results for input(s): CKTOTAL, CKMB, CKMBINDEX, TROPONINI in the last 168 hours. BNP (last 3 results) No results for input(s): PROBNP in the last 8760 hours. HbA1C: No results for input(s): HGBA1C in the last 72 hours. CBG: No results for input(s): GLUCAP in the last 168 hours. Lipid Profile: No results for input(s): CHOL, HDL, LDLCALC, TRIG, CHOLHDL, LDLDIRECT in the last 72 hours. Thyroid Function Tests:  Recent Labs  10/28/16 1910  TSH 1.627   Anemia Panel: No results for input(s): VITAMINB12, FOLATE, FERRITIN, TIBC, IRON, RETICCTPCT in the last 72 hours.  Sepsis Labs:  Recent Labs Lab 10/28/16 1910 10/30/16 0347 10/31/16 0436  WBC 12.0* 9.3 8.9    Recent Results (from the past 240 hour(s))  MRSA PCR Screening     Status: None   Collection Time: 10/29/16  6:37 AM  Result Value Ref Range Status   MRSA by PCR NEGATIVE NEGATIVE Final    Comment:        The GeneXpert MRSA Assay  (FDA approved for NASAL specimens only), is one component of a comprehensive MRSA colonization surveillance program. It is not intended to diagnose MRSA infection nor to guide or monitor treatment for MRSA infections.          Radiology Studies: Dg Wrist Complete Left  Result Date: 10/30/2016 CLINICAL DATA:  Recent radius fracture EXAM: LEFT WRIST - COMPLETE 3+ VIEW COMPARISON:  October 28, 2016 FINDINGS: Frontal, oblique, and lateral views were obtained with immobilization device in place. Comminuted fracture of the distal radial metaphysis is again noted with fracture fragment extending into the radiocarpal joint. There is slight volar angulation of distal major fracture fragments. No new fracture. No dislocation. There is extensive arthropathy in the first carpal-metacarpal joint. There is milder osteoarthritic change in the scaphotrapezial and proximal carpal row lesions. Osteoarthritic changes also noted in all visualized PIP and DIP joints. IMPRESSION: Comminuted fracture distal radial metaphysis with alignment essentially stable compared to 2 days prior. Note that fracture fragment extends into the radiocarpal joint with mild displacement of fracture  fragments in this area, stable. No new fracture. No dislocation. Osteoarthritic change, most marked in the first carpal -metacarpal joint region. Electronically Signed   By: Bretta BangWilliam  Woodruff III M.D.   On: 10/30/2016 21:48   Pelvis Portable  Result Date: 10/29/2016 CLINICAL DATA:  Post internal fixation EXAM: PORTABLE PELVIS 1 VIEW COMPARISON:  the previous day's study FINDINGS: Interval placement of IM rod and sliding screw across a comminuted left intertrochanteric fracture, major fracture fragments in near anatomic alignment. There is persistent medial displacement of the inferior trochanter fracture fragment. No new fracture or dislocation. Distal aspect of IM rod not visualized. Stable changes of right hip osteoarthritis. IMPRESSION:  1. Internal fixation of left intertrochanteric femur fracture without apparent complication. Electronically Signed   By: Corlis Leak  Hassell M.D.   On: 10/29/2016 17:41   Dg C-arm 1-60 Min  Result Date: 10/29/2016 CLINICAL DATA:  Placement of left femoral nail.  Initial encounter. EXAM: LEFT FEMUR 2 VIEWS COMPARISON:  Left hip radiographs performed 10/28/2016 FINDINGS: Four fluoroscopic C-arm images are provided from the OR, demonstrating placement of a left femoral intramedullary nail and screw, with fixation of left femoral intertrochanteric fracture in grossly anatomic alignment. No new fracture is seen. The left femoral head remains seated at the acetabulum. Scattered vascular calcifications are seen. IMPRESSION: Status post internal fixation of left femoral intertrochanteric fracture in grossly anatomic alignment. Electronically Signed   By: Roanna RaiderJeffery  Chang M.D.   On: 10/29/2016 17:04   Dg Femur Min 2 Views Left  Result Date: 10/29/2016 CLINICAL DATA:  Placement of left femoral nail.  Initial encounter. EXAM: LEFT FEMUR 2 VIEWS COMPARISON:  Left hip radiographs performed 10/28/2016 FINDINGS: Four fluoroscopic C-arm images are provided from the OR, demonstrating placement of a left femoral intramedullary nail and screw, with fixation of left femoral intertrochanteric fracture in grossly anatomic alignment. No new fracture is seen. The left femoral head remains seated at the acetabulum. Scattered vascular calcifications are seen. IMPRESSION: Status post internal fixation of left femoral intertrochanteric fracture in grossly anatomic alignment. Electronically Signed   By: Roanna RaiderJeffery  Chang M.D.   On: 10/29/2016 17:04   Dg Femur Port Min 2 Views Left  Result Date: 10/29/2016 CLINICAL DATA:  Left hip fracture. EXAM: LEFT FEMUR PORTABLE 2 VIEWS COMPARISON:  Radiographs dated 10/28/2016 and 10/29/2016 FINDINGS: The patient has undergone insertion of an intramedullary nail and lack screw across the comminuted proximal  femur fracture. Distal fixation screw and present in the femoral shaft. The hardware appears in good position with near anatomic alignment and position of the fracture fragments. IMPRESSION: Satisfactory appearance of the left hip after open reduction and internal fixation. Electronically Signed   By: Francene BoyersJames  Maxwell M.D.   On: 10/29/2016 17:41        Scheduled Meds: . memantine  28 mg Oral QHS   And  . donepezil  10 mg Oral QHS  . enoxaparin (LOVENOX) injection  40 mg Subcutaneous Q24H  . ezetimibe  10 mg Oral Daily  . irbesartan  150 mg Oral Daily   Continuous Infusions: . lactated ringers    . lactated ringers Stopped (10/29/16 1659)     LOS: 3 days        Brodi Nery, MD [facility] Pager [phone]  If 7PM-7AM, please contact night-coverage [email or web address] Password [email or web address] 10/31/2016, 1:56 PM

## 2016-10-31 NOTE — Clinical Social Work Note (Signed)
CSW visited room and talked with patient's sons, Harvie HeckRandy and Clide CliffRicky  regarding patient and discharge disposition. If needed, they are willing for [facility]. CSW was also provided with contact information for [facility] [place] to talk with staff administrative staff on how they can meet patient's needs at discharge.  CSW talked with Elayne GuerinAmanda Joyce, office manager and Cammie SickleKathy Petty, Director of [facility] regarding patient. CSW provided them information from PT/OT evals and was advised that they can meet all of patient's needs with ADL's and provide 3 days a week therapy for patient. Marchelle Folksmanda and Olegario MessierKathy informed that the son would be contacted to get his decision. Talked with son Harvie HeckRandy by phone [phone] and informed him of conversation with [facility] Staff and that [facility] declined patient. Mr. [name] wants patient to [facility]. He informed CSW that the doctor called him (after CSW's visit to room) and informed him that patient will have surgery on her wrist tomorrow. Discharge disposition information will be shared with CSW for 5N25.  Genelle BalVanessa Briena Swingler, MSW, LCSW Licensed Clinical Social Worker Clinical Social Work Department [facility] [phone]

## 2016-10-31 NOTE — Progress Notes (Signed)
Patient ID: Vickie SnowballHelen M Brocato, female   DOB: 1928-03-08, 83 y.o.   MRN: [id number] Telephone  Conversation with Irean Hongandy Fristoe (Patient's son) We have had a lengthy discussion with the Mr. [name] in regards to his mother's left distal radius fracture and course of care. The patient does have significant dementia but does perform ADL's independently in an assisted living facility. We have discussed we the son recommendations for operative intervention given her degree of independence and he is in agreeance. We will plan for ORIF of the left distal radius tomorrow.

## 2016-10-31 NOTE — Evaluation (Signed)
Occupational Therapy Evaluation Patient Details Name: Vickie SnowballHelen M Bruski MRN: [id number] DOB: 06/11/1928 Today's Date: 10/31/2016    History of Present Illness Vickie SnowballHelen M Rozycki is a 83 y.o. female, [facility], ambulate short distances in the ALF without the help of a cane or a walker, dependent on activities of daily living, PMH of fairly advanced dementia, HTN, HLD, vitamin D deficiency, osteoporosis presented to the ED following a mechanical fall at the facility followed by left-sided hip and wrist pain. Now s/p IM Nail fixation L femur, 30% PWB; wrist fracture with splint to be managed non-operatively, may weight bear through elbow   Clinical Impression   Patient is s/p L IM nail femur surgery resulting in functional limitations due to the deficits listed below (see OT problem list). PTA was at ALF and family would like patient to return. Pt however requires total +2 care at this time and question facility ability to manage at this level. Son reports they could have someone with her every 15 minutes. However patient was ambulatory PTA and now total dependence for transfers.  Patient will benefit from skilled OT acutely to increase independence and safety with ADLS to allow discharge SNF. Pt likely to d/c snf due to total +2 care . OT to defer treatment at this time to facility      Follow Up Recommendations       Equipment Recommendations  Wheelchair cushion (measurements OT);Wheelchair (measurements OT);Hospital bed;Other (comment) (lift)    Recommendations for Other Services       Precautions / Restrictions Precautions Precautions: Fall Precaution Comments: R UE splint Restrictions LUE Weight Bearing: Weight bear through elbow only LLE Weight Bearing: Partial weight bearing LLE Partial Weight Bearing Percentage or Pounds: 30      Mobility Bed Mobility Overal bed mobility: Needs Assistance Bed Mobility: Supine to Sit     Supine to sit: Total assist      General bed mobility comments: Pt requires HOB elevated and pad used to help progress to EOB. pt calliing out with any tactile input due to startle  Transfers                 General transfer comment: lateral transfer to drop arm chair with pad total +2 total (A)    Balance                                            ADL Overall ADL's : Needs assistance/impaired Eating/Feeding: Maximal assistance   Grooming: Maximal assistance   Upper Body Bathing: Maximal assistance   Lower Body Bathing: Total assistance                         General ADL Comments: pt requesting to void bladder. placed on bed pan and no void. Pt slid from bed to chair with pad and drop arm chair . Pt unable to safely stand pivot at this time.     Vision     Perception     Praxis      Pertinent Vitals/Pain Pain Assessment: Faces Faces Pain Scale: Hurts whole lot Pain Location: L hip grimacing difficult to report where it hurts     Hand Dominance Right   Extremity/Trunk Assessment Upper Extremity Assessment Upper Extremity Assessment: LUE deficits/detail LUE Deficits / Details: splint applied by Dr Amanda PeaGramig and elevated on  pillow. encouraged digit movement   Lower Extremity Assessment Lower Extremity Assessment: Defer to PT evaluation   Cervical / Trunk Assessment Cervical / Trunk Assessment: Kyphotic   Communication Communication Communication: No difficulties   Cognition Arousal/Alertness: Awake/alert Behavior During Therapy: Anxious Overall Cognitive Status: History of cognitive impairments - at baseline                 General Comments: pt able to recall sister and sons by name and face. pt however unaware of reason for admission. Pt even with movement and pain no awareness. pt asking several times what happen   General Comments       Exercises       Shoulder Instructions      Home Living Family/patient expects to be discharged to::  Skilled nursing facility                                 Additional Comments: son requesting return to ALF because ALF reports they can provide therapy 3 x per week just like Pension scheme manager      Prior Functioning/Environment Level of Independence: Needs assistance    ADL's / Homemaking Assistance Needed: walks to bathroom by herself prior to fall per son            OT Problem List:     OT Treatment/Interventions:      OT Goals(Current goals can be found in the care plan section) Acute Rehab OT Goals Patient Stated Goal: Unable to state  OT Frequency:     Barriers to D/C:            Co-evaluation              End of Session Nurse Communication: Mobility status;Precautions;Weight bearing status  Activity Tolerance: Patient tolerated treatment well Patient left: in chair;with call bell/phone within reach;with chair alarm set;with family/visitor present;with SCD's reapplied   Time: 1610-9604 OT Time Calculation (min): 34 min Charges:  OT General Charges $OT Visit: 1 Procedure OT Evaluation $OT Eval High Complexity: 1 Procedure OT Treatments $Self Care/Home Management : 8-22 mins G-Codes:    Boone Master B 08-Nov-2016, 11:49 AM   Mateo Flow   OTR/L Pager: [phone] Office: [phone] .

## 2016-10-31 NOTE — Progress Notes (Signed)
Subjective: She is without complaints this morning. She is resting upon entering the room she remains pleasantly demented. She denies any pain about the upper extremity.    Objective: Vital signs in last 24 hours: Temp:  [98.8 F (37.1 C)-98.9 F (37.2 C)] 98.8 F (37.1 C) (02/14 0400) Pulse Rate:  [61-81] 74 (02/14 0400) Resp:  [16-17] 16 (02/14 0400) BP: (103-137)/(49-58) 133/58 (02/14 0400) SpO2:  [91 %-98 %] 91 % (02/14 0400)  Intake/Output from previous day: 02/13 0701 - 02/14 0700 In: 600 [P.O.:600] Out: 250 [Urine:250] Intake/Output this shift: No intake/output data recorded.   Recent Labs  10/28/16 1910 10/30/16 0347 10/31/16 0436  HGB 13.5 9.4* 9.6*    Recent Labs  10/30/16 0347 10/31/16 0436  WBC 9.3 8.9  RBC 2.99* 3.01*  HCT 28.6* 28.5*  PLT 167 151    Recent Labs  10/30/16 0347 10/31/16 0436  NA 136 133*  K 3.9 3.6  CL 101 100*  CO2 24 22  BUN 11 11  CREATININE 0.67 0.62  GLUCOSE 126* 119*  CALCIUM 7.7* 8.0*   No results for input(s): LABPT, INR in the last 72 hours.  She is pleasantly demented He is in no acute distress Focused examination of the left upper extremity shows that the arm is in a deep into position. I have elevated the extremity on 2 pillows I have discussed with attempts at range of motion. She is able to flex and extend the digits but does of course have some degree of associated mild to moderate edema of the dorsal hand given the dependent edema. Sensation and refill are intact. Radiographs: Portable films obtained yesterday are reviewed and show that she has a volar Barton's type presentation which remains in alignment  Assessment/Plan: Patient Active Problem List   Diagnosis Date Noted  . Closed pertrochanteric fracture of femur, left, initial encounter (HCC) 10/29/2016  . Closed left hip fracture (HCC) 10/28/2016  . Dementia due to Alzheimer's disease 10/28/2016  . Aortic atherosclerosis (HCC) 05/29/2016  .  Osteoporosis, senile 12/16/2013  . GERD (gastroesophageal reflux disease) 11/11/2013  . HTN (hypertension) 11/11/2013  . Hyperlipidemia 11/11/2013  . Memory deficit 11/11/2013  We will continue close observation and at this juncture we'll plan for closed/conservative treatment is long as her fracture remains aligned given her overall medical issues and degree of dementia. Should she have progressive collapse and/or angulation one would consider ORIF. Continue to follow along closely. She will be non-weight-bearing in regards to the left hand and wrist but can weight-bear through the proximal forearm and elbow. We will have the nursing staff locked her for finger range of motion and continue to elevate the extremity.   Herny Scurlock L 10/31/2016, 8:33 AM

## 2016-11-01 ENCOUNTER — Encounter (HOSPITAL_COMMUNITY): Payer: Self-pay | Admitting: Certified Registered Nurse Anesthetist

## 2016-11-01 ENCOUNTER — Inpatient Hospital Stay (HOSPITAL_COMMUNITY): Payer: Medicare HMO | Admitting: Certified Registered"

## 2016-11-01 ENCOUNTER — Encounter (HOSPITAL_COMMUNITY)
Admission: EM | Disposition: A | Discharge status: Home Health | Payer: Self-pay | Source: Home / Self Care | Attending: Internal Medicine

## 2016-11-01 ENCOUNTER — Inpatient Hospital Stay (HOSPITAL_COMMUNITY): Payer: Medicare HMO

## 2016-11-01 HISTORY — PX: ORIF WRIST FRACTURE: SHX2133

## 2016-11-01 LAB — BASIC METABOLIC PANEL
Anion gap: 12 (ref 5–15)
BUN: 12 mg/dL (ref 6–20)
CALCIUM: 8.4 mg/dL — AB (ref 8.9–10.3)
CO2: 24 mmol/L (ref 22–32)
CREATININE: 0.9 mg/dL (ref 0.44–1.00)
Chloride: 99 mmol/L — ABNORMAL LOW (ref 101–111)
GFR calc Af Amer: >60 mL/min (ref >60)
GFR calc non Af Amer: 56 mL/min — ABNORMAL LOW (ref >60)
GLUCOSE: 117 mg/dL — AB (ref 65–99)
Potassium: 4.1 mmol/L (ref 3.5–5.1)
Sodium: 135 mmol/L (ref 135–145)

## 2016-11-01 SURGERY — OPEN REDUCTION INTERNAL FIXATION (ORIF) WRIST FRACTURE
Anesthesia: Monitor Anesthesia Care | Site: Wrist | Laterality: Left

## 2016-11-01 MED ORDER — ONDANSETRON HCL 4 MG/2ML IJ SOLN: 4.0000 mg | Freq: Once | INTRAMUSCULAR | Status: DC | PRN

## 2016-11-01 MED ORDER — CEFAZOLIN IN D5W 1 GM/50ML IV SOLN
1.0000 g | Freq: Three times a day (TID) | INTRAVENOUS | Status: DC
  Administered 2016-11-02 – 2016-11-03 (×5): 1 g via INTRAVENOUS
  Filled 2016-11-01 (×6): qty 50

## 2016-11-01 MED ORDER — 0.9 % SODIUM CHLORIDE (POUR BTL) OPTIME
TOPICAL | Status: DC | PRN
  Administered 2016-11-01: 1000 mL

## 2016-11-01 MED ORDER — BUPIVACAINE-EPINEPHRINE (PF) 0.5% -1:200000 IJ SOLN
INTRAMUSCULAR | Status: DC | PRN
  Administered 2016-11-01: 30 mL via PERINEURAL

## 2016-11-01 MED ORDER — MIDAZOLAM HCL 2 MG/2ML IJ SOLN
INTRAMUSCULAR | Status: AC
  Filled 2016-11-01: qty 2

## 2016-11-01 MED ORDER — PROPOFOL 10 MG/ML IV BOLUS
INTRAVENOUS | Status: AC
  Filled 2016-11-01: qty 20

## 2016-11-01 MED ORDER — MEPERIDINE HCL 25 MG/ML IJ SOLN: 6.2500 mg | INTRAMUSCULAR | Status: DC | PRN

## 2016-11-01 MED ORDER — CEFAZOLIN SODIUM 1 G IJ SOLR
INTRAMUSCULAR | Status: DC | PRN
  Administered 2016-11-01: 2 g via INTRAMUSCULAR

## 2016-11-01 MED ORDER — LACTATED RINGERS IV SOLN
INTRAVENOUS | Status: DC | PRN
  Administered 2016-11-01: 17:00:00 via INTRAVENOUS

## 2016-11-01 MED ORDER — PROPOFOL 500 MG/50ML IV EMUL
INTRAVENOUS | Status: DC | PRN
  Administered 2016-11-01: 25 ug/kg/min via INTRAVENOUS

## 2016-11-01 MED ORDER — FENTANYL CITRATE (PF) 100 MCG/2ML IJ SOLN
INTRAMUSCULAR | Status: AC
  Filled 2016-11-01: qty 2

## 2016-11-01 MED ORDER — FENTANYL CITRATE (PF) 100 MCG/2ML IJ SOLN
INTRAMUSCULAR | Status: DC | PRN
  Administered 2016-11-01 (×2): 12.5 ug via INTRAVENOUS

## 2016-11-01 MED ORDER — PROPOFOL 10 MG/ML IV BOLUS
INTRAVENOUS | Status: DC | PRN
  Administered 2016-11-01: 20 mg via INTRAVENOUS

## 2016-11-01 MED ORDER — LACTATED RINGERS IV SOLN
INTRAVENOUS | Status: DC
  Administered 2016-11-01: 16:00:00 via INTRAVENOUS

## 2016-11-01 MED ORDER — ALUM & MAG HYDROXIDE-SIMETH 200-200-20 MG/5ML PO SUSP
30.0000 mL | ORAL | Status: DC | PRN
  Filled 2016-11-01: qty 30

## 2016-11-01 MED ORDER — HYDROMORPHONE HCL 1 MG/ML IJ SOLN: 0.2500 mg | INTRAMUSCULAR | Status: DC | PRN

## 2016-11-01 SURGICAL SUPPLY — 64 items
BANDAGE ACE 4X5 VEL STRL LF (GAUZE/BANDAGES/DRESSINGS) ×3 IMPLANT
BANDAGE ELASTIC 3 VELCRO ST LF (GAUZE/BANDAGES/DRESSINGS) ×3 IMPLANT
BIT DRILL 2.2 SS TIBIAL (BIT) ×2 IMPLANT
BLADE SURG ROTATE 9660 (MISCELLANEOUS) IMPLANT
BNDG CMPR 9X4 STRL LF SNTH (GAUZE/BANDAGES/DRESSINGS) ×1
BNDG ESMARK 4X9 LF (GAUZE/BANDAGES/DRESSINGS) ×3 IMPLANT
BNDG GAUZE ELAST 4 BULKY (GAUZE/BANDAGES/DRESSINGS) ×5 IMPLANT
CANISTER SUCTION 2500CC (MISCELLANEOUS) ×3 IMPLANT
CORDS BIPOLAR (ELECTRODE) ×3 IMPLANT
COVER SURGICAL LIGHT HANDLE (MISCELLANEOUS) ×3 IMPLANT
CUFF TOURNIQUET SINGLE 18IN (TOURNIQUET CUFF) ×3 IMPLANT
CUFF TOURNIQUET SINGLE 24IN (TOURNIQUET CUFF) IMPLANT
DRAIN TLS ROUND 10FR (DRAIN) IMPLANT
DRAPE OEC MINIVIEW 54X84 (DRAPES) IMPLANT
DRAPE SURG 17X23 STRL (DRAPES) ×3 IMPLANT
DRSG EMULSION OIL 3X3 NADH (GAUZE/BANDAGES/DRESSINGS) ×2 IMPLANT
DVR VOLAR RIM NRW PLATE LEFT (Plate) ×3 IMPLANT
GAUZE SPONGE 4X4 12PLY STRL (GAUZE/BANDAGES/DRESSINGS) ×3 IMPLANT
GAUZE XEROFORM 1X8 LF (GAUZE/BANDAGES/DRESSINGS) ×3 IMPLANT
GLOVE BIOGEL M 8.0 STRL (GLOVE) ×3 IMPLANT
GLOVE SS BIOGEL STRL SZ 8 (GLOVE) ×1 IMPLANT
GLOVE SUPERSENSE BIOGEL SZ 8 (GLOVE) ×2
GOWN STRL REUS W/ TWL LRG LVL3 (GOWN DISPOSABLE) ×1 IMPLANT
GOWN STRL REUS W/ TWL XL LVL3 (GOWN DISPOSABLE) ×2 IMPLANT
GOWN STRL REUS W/TWL LRG LVL3 (GOWN DISPOSABLE) ×3
GOWN STRL REUS W/TWL XL LVL3 (GOWN DISPOSABLE) ×6
KIT BASIN OR (CUSTOM PROCEDURE TRAY) ×3 IMPLANT
KIT ROOM TURNOVER OR (KITS) ×3 IMPLANT
LOOP VESSEL MAXI BLUE (MISCELLANEOUS) IMPLANT
MANIFOLD NEPTUNE II (INSTRUMENTS) ×3 IMPLANT
NEEDLE 22X1 1/2 (OR ONLY) (NEEDLE) IMPLANT
NS IRRIG 1000ML POUR BTL (IV SOLUTION) ×3 IMPLANT
PACK ORTHO EXTREMITY (CUSTOM PROCEDURE TRAY) ×3 IMPLANT
PAD ARMBOARD 7.5X6 YLW CONV (MISCELLANEOUS) ×6 IMPLANT
PAD CAST 3X4 CTTN HI CHSV (CAST SUPPLIES) ×1 IMPLANT
PAD CAST 4YDX4 CTTN HI CHSV (CAST SUPPLIES) ×1 IMPLANT
PADDING CAST COTTON 3X4 STRL (CAST SUPPLIES) ×3
PADDING CAST COTTON 4X4 STRL (CAST SUPPLIES) ×3
PADDING CAST COTTON 6X4 STRL (CAST SUPPLIES) ×2 IMPLANT
PEG LOCKING SMOOTH 2.2X16 (Screw) ×4 IMPLANT
PEG LOCKING SMOOTH 2.2X18 (Peg) ×6 IMPLANT
PLATE VOLAR RIM NRRW DVR LEFT (Plate) IMPLANT
SCREW LOCK 12X2.7X 3 LD (Screw) IMPLANT
SCREW LOCK 14X2.7X 3 LD TPR (Screw) IMPLANT
SCREW LOCKING 2.7X12MM (Screw) ×6 IMPLANT
SCREW LOCKING 2.7X13MM (Screw) ×2 IMPLANT
SCREW LOCKING 2.7X14 (Screw) ×3 IMPLANT
SCREW LOCKING 2.7X15MM (Screw) ×2 IMPLANT
SCRUB BETADINE 4OZ XXX (MISCELLANEOUS) ×3 IMPLANT
SOLUTION BETADINE 4OZ (MISCELLANEOUS) ×3 IMPLANT
SPLINT FIBERGLASS 3X12 (CAST SUPPLIES) ×2 IMPLANT
SPONGE GAUZE 4X4 12PLY STER LF (GAUZE/BANDAGES/DRESSINGS) ×2 IMPLANT
SPONGE LAP 4X18 X RAY DECT (DISPOSABLE) IMPLANT
SUT MNCRL AB 4-0 PS2 18 (SUTURE) ×3 IMPLANT
SUT PROLENE 3 0 PS 2 (SUTURE) IMPLANT
SUT VIC AB 3-0 FS2 27 (SUTURE) IMPLANT
SYR CONTROL 10ML LL (SYRINGE) IMPLANT
SYSTEM CHEST DRAIN TLS 7FR (DRAIN) ×2 IMPLANT
TOWEL OR 17X24 6PK STRL BLUE (TOWEL DISPOSABLE) ×3 IMPLANT
TOWEL OR 17X26 10 PK STRL BLUE (TOWEL DISPOSABLE) ×3 IMPLANT
TUBE CONNECTING 12'X1/4 (SUCTIONS) ×1
TUBE CONNECTING 12X1/4 (SUCTIONS) ×2 IMPLANT
TUBE EVACUATION TLS (MISCELLANEOUS) ×3 IMPLANT
WATER STERILE IRR 1000ML POUR (IV SOLUTION) ×3 IMPLANT

## 2016-11-01 NOTE — Care Management (Signed)
[facility], family desires for her to return when medically ready. Social worker is aware.

## 2016-11-01 NOTE — Progress Notes (Signed)
PT Cancellation Note  Patient Details Name: Vickie SnowballHelen M Brandow MRN: [id number] DOB: 09-11-1928   Cancelled Treatment:     L ORIF surgery today for L distal Radius Fx   Felecia ShellingLori Chaka Boyson  PTA [facility]  Acute  Rehab Pager      [phone]

## 2016-11-01 NOTE — Care Management Important Message (Signed)
Important Message  Patient Details  Name: Vickie Hall MRN: [id number] Date of Birth: [birth year]   Medicare Important Message Given:  Other (see comment) Unsigned copy left in the patient room for family to signed as the patient was disoriented   [name] 11/01/2016, 11:44 AM

## 2016-11-01 NOTE — Progress Notes (Signed)
PROGRESS NOTE  Vickie Hall  ZOX:[id number] DOB: 1928-03-06  DOA: 10/28/2016 PCP: Rudi Heap, MD   Brief Narrative:  83 y.o. female, resident of [address] [facility], ambulate short distances in the ALF without the help of a cane or a walker, dependent on activities of daily living, PMH of fairly advanced dementia, HTN, HLD, vitamin D deficiency, osteoporosis presented to the ED following a mechanical fall at the facility followed by left-sided hip and wrist pain. She was initially taken to [facility] and then transferred to [facility] for evaluation and management by orthopedics. Went for surgical fixation of left hip fracture on 10/29/16 afternoon.   Assessment & Plan:   Principal Problem:   Closed left hip fracture (HCC) Active Problems:   GERD (gastroesophageal reflux disease)   HTN (hypertension)   Hyperlipidemia   Dementia due to Alzheimer's disease   Closed pertrochanteric fracture of femur, left, initial encounter (HCC)    1. Acute closed left hip fracture (communited intertrochanteric fracture with mild displacement): Orthopedic consult greatly appreciated, went for surgical repair 2/12, PT consult pending, continue with when necessary pain medication, will need DVT prophylaxis for the next 30 days. 2. Left wrist fracture: hand Surgery consult greatly appreciated, plan for surgical repair today . 3. Postoperative anemia:  expected postoperative drop, continue to monitor, check CBC in a.m. 4. Essential hypertension:  reasonable inpatient control. Continue ARB. 5. Hyperlipidemia: Continue Zetia. 6. Advanced dementia: As per son, Alzheimer's dementia. No behavioral abnormalities. Continue home dose of Namzaric. 7. Osteoporosis and vitamin D deficiency: Hold medications while hospitalized and may resume at discharge. 8. Sinus bradycardia:  TSH normal. Repeat EKG on 10/28/16 shows sinus rhythm at 62 bpm with few PACs, normal axis, and no acute changes. QTC 439 ms.  She is asymptomatic. 9. Leukocytosis: Possibly stress response. No symptoms or signs suggestive of infection. Chest x-ray and urine microscopy unremarkable. resolved     DVT prophylaxis: Lovenox Code Status: DO NOT RESUSCITATE Family Communication: none at bedside Disposition Plan: to SNF,in 1-2 days once cleared by hand sx   Consultants:   Orthopedics Dr Veda Canning  Hand surgery Dr Cliffton Asters  Procedures:   Intramedullary fixation of left femur by Dr. Linna Caprice on 10/29/2016  Antimicrobials:   None    Subjective: Seen this morning. Denied complaints. Denied pain. Denies any shortness of breath, nausea vomiting or headache Objective:  Vitals:   10/31/16 0952 10/31/16 1325 10/31/16 2300 11/01/16 1323  BP: 126/69 (!) 112/46 (!) 131/56 (!) 127/57  Pulse: 70 60 74 62  Resp: 20 17  16   Temp: 98 F (36.7 C) 98.3 F (36.8 C) 98.8 F (37.1 C) 99.9 F (37.7 C)  TempSrc: Oral Oral Oral Oral  SpO2: 92% 91% 92% 96%    Intake/Output Summary (Last 24 hours) at 11/01/16 1501 Last data filed at 11/01/16 0830  Gross per 24 hour  Intake              285 ml  Output                0 ml  Net              285 ml   There were no vitals filed for this visit.  Examination:  General exam: 83 elderly female lying comfortably supine in bed. Son at bedside. Respiratory system: Clear to auscultation. Respiratory effort normal. Cardiovascular system: S1 & S2 heard, RRR. No JVD, murmurs, rubs, gallops or clicks. No pedal edema. Gastrointestinal system: Abdomen  is nondistended, soft and nontender. No organomegaly or masses felt. Normal bowel sounds heard. Central nervous system: Alert and oriented only to self. No focal neurological deficits. Extremities:  Left forearm in a splint. Good pulses, no cyanosis in bilateral lower extremities Skin: No rashes, lesions or ulcers Psychiatry: Judgement and insight impaired. Mood & affect appropriate.     Data Reviewed: I have personally  reviewed following labs and imaging studies  CBC:  Recent Labs Lab 10/28/16 1910 10/30/16 0347 10/31/16 0436  WBC 12.0* 9.3 8.9  NEUTROABS 9.7*  --   --   HGB 13.5 9.4* 9.6*  HCT 41.3 28.6* 28.5*  MCV 96.7 95.7 94.7  PLT 232 167 151   Basic Metabolic Panel:  Recent Labs Lab 10/28/16 1910 10/30/16 0347 10/31/16 0436  NA 133* 136 133*  K 4.1 3.9 3.6  CL 99* 101 100*  CO2 25 24 22   GLUCOSE 155* 126* 119*  BUN 12 11 11   CREATININE 0.74 0.67 0.62  CALCIUM 8.6* 7.7* 8.0*   GFR: CrCl cannot be calculated (Unknown ideal weight.). Liver Function Tests: No results for input(s): AST, ALT, ALKPHOS, BILITOT, PROT, ALBUMIN in the last 168 hours. No results for input(s): LIPASE, AMYLASE in the last 168 hours. No results for input(s): AMMONIA in the last 168 hours. Coagulation Profile: No results for input(s): INR, PROTIME in the last 168 hours. Cardiac Enzymes: No results for input(s): CKTOTAL, CKMB, CKMBINDEX, TROPONINI in the last 168 hours. BNP (last 3 results) No results for input(s): PROBNP in the last 8760 hours. HbA1C: No results for input(s): HGBA1C in the last 72 hours. CBG: No results for input(s): GLUCAP in the last 168 hours. Lipid Profile: No results for input(s): CHOL, HDL, LDLCALC, TRIG, CHOLHDL, LDLDIRECT in the last 72 hours. Thyroid Function Tests: No results for input(s): TSH, T4TOTAL, FREET4, T3FREE, THYROIDAB in the last 72 hours. Anemia Panel: No results for input(s): VITAMINB12, FOLATE, FERRITIN, TIBC, IRON, RETICCTPCT in the last 72 hours.  Sepsis Labs:  Recent Labs Lab 10/28/16 1910 10/30/16 0347 10/31/16 0436  WBC 12.0* 9.3 8.9    Recent Results (from the past 240 hour(s))  MRSA PCR Screening     Status: None   Collection Time: 10/29/16  6:37 AM  Result Value Ref Range Status   MRSA by PCR NEGATIVE NEGATIVE Final    Comment:        The GeneXpert MRSA Assay (FDA approved for NASAL specimens only), is one component of a comprehensive  MRSA colonization surveillance program. It is not intended to diagnose MRSA infection nor to guide or monitor treatment for MRSA infections.          Radiology Studies: Dg Wrist Complete Left  Result Date: 10/30/2016 CLINICAL DATA:  Recent radius fracture EXAM: LEFT WRIST - COMPLETE 3+ VIEW COMPARISON:  October 28, 2016 FINDINGS: Frontal, oblique, and lateral views were obtained with immobilization device in place. Comminuted fracture of the distal radial metaphysis is again noted with fracture fragment extending into the radiocarpal joint. There is slight volar angulation of distal major fracture fragments. No new fracture. No dislocation. There is extensive arthropathy in the first carpal-metacarpal joint. There is milder osteoarthritic change in the scaphotrapezial and proximal carpal row lesions. Osteoarthritic changes also noted in all visualized PIP and DIP joints. IMPRESSION: Comminuted fracture distal radial metaphysis with alignment essentially stable compared to 2 days prior. Note that fracture fragment extends into the radiocarpal joint with mild displacement of fracture fragments in this area, stable. No new fracture.  No dislocation. Osteoarthritic change, most marked in the first carpal -metacarpal joint region. Electronically Signed   By: Bretta BangWilliam  Woodruff III M.D.   On: 10/30/2016 21:48        Scheduled Meds: . memantine  28 mg Oral QHS   And  . donepezil  10 mg Oral QHS  . enoxaparin (LOVENOX) injection  40 mg Subcutaneous Q24H  . ezetimibe  10 mg Oral Daily  . irbesartan  150 mg Oral Daily   Continuous Infusions: . lactated ringers    . lactated ringers 50 mL/hr (11/01/16 0118)     LOS: 4 days        Huey BienenstockELGERGAWY, Dehaven Sine, MD [facility] Pager [phone]  If 7PM-7AM, please contact night-coverage [email or web address] Password [email or web address] 11/01/2016, 3:01 PM

## 2016-11-01 NOTE — Transfer of Care (Signed)
Immediate Anesthesia Transfer of Care Note  Patient: Vickie Hall  Procedure(s) Performed: Procedure(s): OPEN REDUCTION INTERNAL FIXATION (ORIF) WRIST FRACTURE (Left)  Patient Location: PACU  Anesthesia Type:MAC combined with regional for post-op pain  Level of Consciousness: awake, alert  and patient cooperative, patient with alzheimer's and oriented to person  Airway & Oxygen Therapy: Patient Spontanous Breathing and Patient connected to face mask oxygen  Post-op Assessment: Report given to RN and Post -op Vital signs reviewed and stable  Post vital signs: Reviewed and stable  Last Vitals:  Vitals:   10/31/16 2300 11/01/16 1509  BP: (!) 131/56 (!) 146/54  Pulse: 74 (!) 59  Resp:  16  Temp: 37.1 C 36.7 C    Last Pain:  Vitals:   11/01/16 1509  TempSrc: Oral  PainSc:          Complications: No apparent anesthesia complications

## 2016-11-01 NOTE — Anesthesia Procedure Notes (Signed)
Anesthesia Regional Block:  Supraclavicular block  Pre-Anesthetic Checklist: ,, timeout performed, Correct Patient, Correct Site, Correct Laterality, Correct Procedure, Correct Position, site marked, Risks and benefits discussed,  Surgical consent,  Pre-op evaluation,  At surgeon's request and post-op pain management  Laterality: Left  Prep: chloraprep       Needles:   Needle Type: Echogenic Stimulator Needle     Needle Length: 9cm 9 cm Needle Gauge: 21 and 21 G    Additional Needles:  Procedures: ultrasound guided (picture in chart) and nerve stimulator Supraclavicular block  Nerve Stimulator or Paresthesia:  Response: 0.4 mA,   Additional Responses:   Narrative:  Start time: 11/01/2016 3:50 PM End time: 11/01/2016 4:00 PM Injection made incrementally with aspirations every 5 mL.  Performed by: Personally  Anesthesiologist: Arta BruceSSEY, Canuto Kingston  Additional Notes: Monitors applied. Patient sedated. Sterile prep and drape,hand hygiene and sterile gloves were used. Relevant anatomy identified.Needle position confirmed.Local anesthetic injected incrementally after negative aspiration. Local anesthetic spread visualized around nerve(s). Vascular puncture avoided. No complications. Image printed for medical record.The patient tolerated the procedure well.

## 2016-11-01 NOTE — Op Note (Signed)
  See full dictation [phone]  Status post open reduction internal fixation with volar rim plate left radius fracture.  We will plan for IV antibiotics for 48 hours and I will request a CT of her head and face given the facial trauma. I reviewed her chart at length. She will need to be mobilized. We recommend weightbearing with a platform walker. She can weight-bear through her elbow on the left side.  I discussed with the family the challenges ahead.  Becky Berberian M.D.

## 2016-11-01 NOTE — Anesthesia Procedure Notes (Signed)
Procedure Name: MAC Date/Time: 11/01/2016 5:41 PM Performed by: Everlean Cherry A Pre-anesthesia Checklist: Patient identified, Emergency Drugs available, Suction available and Patient being monitored Patient Re-evaluated:Patient Re-evaluated prior to inductionOxygen Delivery Method: Simple face mask

## 2016-11-01 NOTE — Anesthesia Postprocedure Evaluation (Addendum)
Anesthesia Post Note  Patient: Vickie SnowballHelen M Hall  Procedure(s) Performed: Procedure(s) (LRB): OPEN REDUCTION INTERNAL FIXATION (ORIF) WRIST FRACTURE (Left)  Patient location during evaluation: PACU Anesthesia Type: Regional Level of consciousness: awake, lethargic and patient cooperative Pain management: pain level controlled Vital Signs Assessment: post-procedure vital signs reviewed and stable Respiratory status: spontaneous breathing, nonlabored ventilation and respiratory function stable Cardiovascular status: blood pressure returned to baseline Anesthetic complications: no       Last Vitals:  Vitals:   11/01/16 1910 11/01/16 1925  BP: (!) 153/84   Pulse: 87   Resp: 19   Temp:  (!) 36 C    Last Pain:  Vitals:   11/01/16 2055  TempSrc:   PainSc: 0-No pain                 Maryclare Nydam COKER

## 2016-11-02 ENCOUNTER — Encounter (HOSPITAL_COMMUNITY): Payer: Self-pay | Admitting: Orthopedic Surgery

## 2016-11-02 DIAGNOSIS — S62102S Fracture of unspecified carpal bone, left wrist, sequela: Secondary | ICD-10-CM

## 2016-11-02 LAB — CBC
HCT: 23.9 % — ABNORMAL LOW (ref 36.0–46.0)
Hemoglobin: 8 g/dL — ABNORMAL LOW (ref 12.0–15.0)
MCH: 31.7 pg (ref 26.0–34.0)
MCHC: 33.5 g/dL (ref 30.0–36.0)
MCV: 94.8 fL (ref 78.0–100.0)
PLATELETS: 233 10*3/uL (ref 150–400)
RBC: 2.52 MIL/uL — AB (ref 3.87–5.11)
RDW: 14 % (ref 11.5–15.5)
WBC: 8.9 10*3/uL (ref 4.0–10.5)

## 2016-11-02 MED ORDER — BISACODYL 10 MG RE SUPP
10.0000 mg | Freq: Once | RECTAL | Status: AC
  Administered 2016-11-02: 10 mg via RECTAL
  Filled 2016-11-02: qty 1

## 2016-11-02 MED ORDER — SENNOSIDES-DOCUSATE SODIUM 8.6-50 MG PO TABS
1.0000 | ORAL_TABLET | Freq: Two times a day (BID) | ORAL | Status: DC
  Administered 2016-11-02 – 2016-11-04 (×5): 1 via ORAL
  Filled 2016-11-02 (×5): qty 1

## 2016-11-02 MED ORDER — ENOXAPARIN SODIUM 40 MG/0.4ML ~~LOC~~ SOLN
40.0000 mg | SUBCUTANEOUS | Status: DC
  Administered 2016-11-03: 40 mg via SUBCUTANEOUS
  Filled 2016-11-02: qty 0.4

## 2016-11-02 MED ORDER — POLYETHYLENE GLYCOL 3350 17 G PO PACK
17.0000 g | PACK | Freq: Every day | ORAL | Status: DC | PRN
  Administered 2016-11-02: 17 g via ORAL
  Filled 2016-11-02: qty 1

## 2016-11-02 MED ORDER — ENOXAPARIN SODIUM 40 MG/0.4ML ~~LOC~~ SOLN
40.0000 mg | SUBCUTANEOUS | Status: DC
Start: 2016-11-02 — End: 2016-11-02

## 2016-11-02 MED ORDER — BISACODYL 5 MG PO TBEC: 10.0000 mg | DELAYED_RELEASE_TABLET | Freq: Once | ORAL | Status: DC

## 2016-11-02 MED ORDER — POLYETHYLENE GLYCOL 3350 17 G PO PACK
17.0000 g | PACK | Freq: Two times a day (BID) | ORAL | Status: AC
  Administered 2016-11-02 – 2016-11-04 (×3): 17 g via ORAL
  Filled 2016-11-02 (×3): qty 1

## 2016-11-02 NOTE — Progress Notes (Signed)
Subjective: 1 Day Post-Op Procedure(s) (LRB): OPEN REDUCTION INTERNAL FIXATION (ORIF) WRIST FRACTURE (Left) Patient reports pain as minimal. Her son is in the room. We have discussed all issues with he and his family. CT scan of the head was negative. The patient does not report current complaints.  Objective: Vital signs in last 24 hours: Temp:  [96.8 F (36 C)-98.2 F (36.8 C)] 98.2 F (36.8 C) (02/16 1500) Pulse Rate:  [65-87] 78 (02/16 1500) Resp:  [16-19] 16 (02/16 1500) BP: (117-157)/(51-84) 117/58 (02/16 1500) SpO2:  [88 %-100 %] 93 % (02/16 1500)  Intake/Output from previous day: 02/15 0701 - 02/16 0700 In: 1200 [I.V.:1200] Out: 10 [Blood:10] Intake/Output this shift: Total I/O In: 240 [P.O.:240] Out: 4 [Urine:2; Stool:2]   Recent Labs  10/31/16 0436 11/02/16 0537  HGB 9.6* 8.0*    Recent Labs  10/31/16 0436 11/02/16 0537  WBC 8.9 8.9  RBC 3.01* 2.52*  HCT 28.5* 23.9*  PLT 151 233    Recent Labs  10/31/16 0436 11/01/16 2035  NA 133* 135  K 3.6 4.1  CL 100* 99*  CO2 22 24  BUN 11 12  CREATININE 0.62 0.90  GLUCOSE 119* 117*  CALCIUM 8.0* 8.4*   No results for input(s): LABPT, INR in the last 72 hours. The patient is resting upon entering the room. She is pleasant in no acute distress. Examination of the left upper extremity shows that her splint is clean dry and intact. The drain is removed without difficulties. She has good early range of motion about the digits. She does have some degree of mild edema. She is not elicit a full fist but does have underlying osteoarthritic changes present about the digits. Signs of infection or dystrophy present.   Assessment/Plan: 1 Day Post-Op Procedure(s) (LRB): OPEN REDUCTION INTERNAL FIXATION (ORIF) WRIST FRACTURE (Left) Patient Active Problem List   Diagnosis Date Noted  . Closed pertrochanteric fracture of femur, left, initial encounter (HCC) 10/29/2016  . Closed left hip fracture (HCC) 10/28/2016  .  Dementia due to Alzheimer's disease 10/28/2016  . Aortic atherosclerosis (HCC) 05/29/2016  . Osteoporosis, senile 12/16/2013  . GERD (gastroesophageal reflux disease) 11/11/2013  . HTN (hypertension) 11/11/2013  . Hyperlipidemia 11/11/2013  . Memory deficit 11/11/2013   We'll continue [facility] care at this juncture. We will continue to encourage the patient to elevate the left upper extremity above her heart and participating frequent finger range of motion to include a full fist and extension. [place] this with her son who can passively move her fingers and will ensure that she tries to keep the hand elevated and not to dependent position. Upon discharge to her assisted living facility, the patient will need to be nonweightbearing with the hand or wrist. However, she can bear weight for transfers and in a platform walker using the forearm and elbow. Therapy consist of finger range of motion, elevation and edema control, elbow flexion and extension as well as shoulder range of motion.. Questions have been encouraged and answered the patient will need to see us in approximately 10-12 days after her discharge for a splint removal and wound check and application of a short arm cast. Vickie Hall L 11/02/2016, 3:52 PM

## 2016-11-02 NOTE — Op Note (Signed)
NAMCammy Brochure:  Langer, Amariss                ACCOUNT NO.:  [id number]  MEDICAL RECORD NO.:  [id number]  LOCATION:  [id number]                        FACILITY:  [facility]  PHYSICIAN:  Dionne AnoWilliam M. Brittnye Josephs, M.D.DATE OF BIRTH:  1928-01-03  DATE OF PROCEDURE:11/01/2016 DATE OF DISCHARGE:tbd                              OPERATIVE REPORT   PREOPERATIVE DIAGNOSIS:  Comminuted complex greater than 5-part intraarticular distal radius fracture, left upper extremity.  POSTOPERATIVE DIAGNOSIS:  Comminuted complex greater than 5-part intraarticular distal radius fracture, left upper extremity.  PROCEDURES: 1. Open reduction and internal fixation of 5-part intraarticular     distal radius fracture with [employer] extended volar rim plate. 2. Closed treatment of ulnar styloid fracture. 3. Four-view radiographic series, left wrist and hand.  SURGEON:  Dionne AnoWilliam M. Amanda PeaGramig, M.D.  ASSISTANT:  None.  COMPLICATIONS:  None.  ANESTHESIA:  Block with IV sedation.  TOURNIQUET TIME:  Less than an hour.  DRAINS:  One.  INDICATIONS:  Pleasant female, who is demented.  I know the family.  She sustained hip fracture and underwent reconstruction with Dr. Linna CapriceSwinteck.  Her x-rays show an intraarticular comminuted fracture.  Given her level of ambulation and activity despite the dementia, I would recommend reconstructive efforts as she does utilize this wrist for purposes of mobilization and getting out of the chair as well as other activities. I feel that her intraarticular fracture is going to displace widely left alone.  I counseled the family and they desired to proceed with surgery.  OPERATION IN DETAIL:  The patient was seen by myself and Anesthesia, taken to the operative theater, underwent smooth induction of anesthetic in the form of a block anesthetic followed by IV sedation.  She was prepped and draped with 2 separate Hibiclens pre-scrubs performed by myself, followed by Betadine scrub and paint, 10 minute, performed  by myself.  Time-out was observed.  Arm was elevated, tourniquet was insufflated, and preoperative antibiotics were given.  Following this, I made a volar radial incision, dissected down, performed fasciotomy of the deep and superficial compartments.  I retracted carpal canal contents ulnarly, incised the pronator and then mobilized the pronator off the bony architecture.  I placed retractors appropriately, reduced the fracture and applied a DVR extended volar rim plate from [employer]. Adequate height, volar tilt, and inclination were observed.  I was pleased with this and the findings.  Four-view x-ray series and live fluoro confirmed excellent reduction.  The distal radioulnar joint was stable and I performed closed treatment of the ulnar styloid fracture.  I irrigated copiously, closed the pronator with Vicryl.  TLS drain was applied.  Tourniquet deflated and the skin edge closed with Prolene.  She tolerated this well.  She had a warm pink hand and a gauntlet splint applied at the end of the procedure.  We will monitor her closely.  I would recommend a CT scan of her face and head given the bruising and the fact that she has been very somnolent and sleeping since her injury.  I have discussed these issues with her at length.  We will move forward accordingly.     Dionne AnoWilliam M. Amanda PeaGramig, M.D.     [facility] Division[facility]G/MEDQ  D:  11/01/2016  T:  11/02/2016  Job:  [id number]

## 2016-11-02 NOTE — Clinical Social Work Note (Signed)
Per conversation between CSW and [facility] they can not provide the pt with the Lovenox subcutaneous injections. MD notified.   [address]Brett Darko [place], [place] [phone]

## 2016-11-02 NOTE — Care Management Important Message (Signed)
Important Message  Patient Details  Name: Vickie SnowballHelen M Hall MRN: [id number] Date of Birth: 19-Apr-1928   Medicare Important Message Given:  Yes    [name] 11/02/2016, 2:00 PM

## 2016-11-02 NOTE — Care Management (Signed)
[facility]. Social worker is aware.

## 2016-11-02 NOTE — Progress Notes (Signed)
PROGRESS NOTE  Vickie Hall  ZOX:[id number] DOB: 1928-03-12  DOA: 10/28/2016 PCP: Rudi Heap, MD   Brief Narrative:  83 y.o. female, resident of [address] [facility], ambulate short distances in the ALF without the help of a cane or a walker, dependent on activities of daily living, PMH of fairly advanced dementia, HTN, HLD, vitamin D deficiency, osteoporosis presented to the ED following a mechanical fall at the facility followed by left-sided hip and wrist pain. She was initially taken to [facility] ED and then transferred to [facility] for evaluation and management by orthopedics. Went for surgical fixation of left hip fracture on 10/29/16 afternoon, and went for open reduction internal fixation of left wrist fracture on 2/15   Assessment & Plan:   Principal Problem:   Closed left hip fracture (HCC) Active Problems:   GERD (gastroesophageal reflux disease)   HTN (hypertension)   Hyperlipidemia   Dementia due to Alzheimer's disease   Closed pertrochanteric fracture of femur, left, initial encounter (HCC)    1. Acute closed left hip fracture (communited intertrochanteric fracture with mild displacement): Orthopedic consult greatly appreciated, went for surgical repair 2/12, PT consult pending, continue with when necessary pain medication, will need DVT prophylaxis for the next 30 days. 2. Left wrist fracture: hand Surgery consult greatly appreciated, went for open reduction internal fixation of left wrist fracture on 2/15, continue with IV antibiotics for 48 hours postoperatively 3. Postoperative anemia:  expected postoperative drop, continue to monitor, check CBC in a.m. hemoglobin of 8, we'll transfuse if needed, will start on iron supplements before discharge 4. Essential hypertension:  reasonable inpatient control. Continue ARB. 5. Hyperlipidemia: Continue Zetia. 6. Advanced dementia: As per son, Alzheimer's dementia. No behavioral abnormalities. Continue home dose of  Namzaric. 7. Osteoporosis and vitamin D deficiency: Hold medications while hospitalized and may resume at discharge. 8. Sinus bradycardia:  TSH normal. Repeat EKG on 10/28/16 shows sinus rhythm at 62 bpm with few PACs, normal axis, and no acute changes. QTC 439 ms. She is asymptomatic. 9. Leukocytosis: Possibly stress response. No symptoms or signs suggestive of infection. Chest x-ray and urine microscopy unremarkable. resolved     DVT prophylaxis: Lovenox Code Status: DO NOT RESUSCITATE Family Communication: none at bedside Disposition Plan: to SNF, in 2 days    Consultants:   Orthopedics Dr Veda Canning  Hand surgery Dr Cliffton Asters  Procedures:   Intramedullary fixation of left femur by Dr. Linna Caprice on 10/29/2016  Antimicrobials:   None    Subjective: Seen this morning. Denied complaints. Denied pain. Denies any shortness of breath, nausea vomiting or headache Objective:  Vitals:   11/01/16 1910 11/01/16 1925 11/02/16 0030 11/02/16 0610  BP: (!) 153/84  (!) 147/81 (!) 136/55  Pulse: 87  76 65  Resp: 19  17 16   Temp:  (!) 96.8 F (36 C) 97.1 F (36.2 C) 98.1 F (36.7 C)  TempSrc:   Oral Oral  SpO2: (!) 88%  93% 93%    Intake/Output Summary (Last 24 hours) at 11/02/16 1421 Last data filed at 11/02/16 1200  Gross per 24 hour  Intake             1320 ml  Output               11 ml  Net             1309 ml   There were no vitals filed for this visit.  Examination:  General exam: Pleasant elderly female lying comfortably  supine in bed. Son at bedside. Respiratory system: Clear to auscultation. Respiratory effort normal. Cardiovascular system: S1 & S2 heard, RRR. No JVD, murmurs, rubs, gallops or clicks. No pedal edema. Gastrointestinal system: Abdomen is nondistended, soft and nontender. No organomegaly or masses felt. Normal bowel sounds heard. Central nervous system: Alert and oriented only to self. No focal neurological deficits. Extremities:  Left forearm Wrapped,  with visible drain. Good pulses, no cyanosis in bilateral lower extremities Skin: No rashes, lesions or ulcers Psychiatry: Judgement and insight impaired. Mood & affect appropriate.     Data Reviewed: I have personally reviewed following labs and imaging studies  CBC:  Recent Labs Lab 10/28/16 1910 10/30/16 0347 10/31/16 0436 11/02/16 0537  WBC 12.0* 9.3 8.9 8.9  NEUTROABS 9.7*  --   --   --   HGB 13.5 9.4* 9.6* 8.0*  HCT 41.3 28.6* 28.5* 23.9*  MCV 96.7 95.7 94.7 94.8  PLT 232 167 151 233   Basic Metabolic Panel:  Recent Labs Lab 10/28/16 1910 10/30/16 0347 10/31/16 0436 11/01/16 2035  NA 133* 136 133* 135  K 4.1 3.9 3.6 4.1  CL 99* 101 100* 99*  CO2 25 24 22 24   GLUCOSE 155* 126* 119* 117*  BUN 12 11 11 12   CREATININE 0.74 0.67 0.62 0.90  CALCIUM 8.6* 7.7* 8.0* 8.4*   GFR: CrCl cannot be calculated (Unknown ideal weight.). Liver Function Tests: No results for input(s): AST, ALT, ALKPHOS, BILITOT, PROT, ALBUMIN in the last 168 hours. No results for input(s): LIPASE, AMYLASE in the last 168 hours. No results for input(s): AMMONIA in the last 168 hours. Coagulation Profile: No results for input(s): INR, PROTIME in the last 168 hours. Cardiac Enzymes: No results for input(s): CKTOTAL, CKMB, CKMBINDEX, TROPONINI in the last 168 hours. BNP (last 3 results) No results for input(s): PROBNP in the last 8760 hours. HbA1C: No results for input(s): HGBA1C in the last 72 hours. CBG: No results for input(s): GLUCAP in the last 168 hours. Lipid Profile: No results for input(s): CHOL, HDL, LDLCALC, TRIG, CHOLHDL, LDLDIRECT in the last 72 hours. Thyroid Function Tests: No results for input(s): TSH, T4TOTAL, FREET4, T3FREE, THYROIDAB in the last 72 hours. Anemia Panel: No results for input(s): VITAMINB12, FOLATE, FERRITIN, TIBC, IRON, RETICCTPCT in the last 72 hours.  Sepsis Labs:  Recent Labs Lab 10/28/16 1910 10/30/16 0347 10/31/16 0436 11/02/16 0537  WBC  12.0* 9.3 8.9 8.9    Recent Results (from the past 240 hour(s))  MRSA PCR Screening     Status: None   Collection Time: 10/29/16  6:37 AM  Result Value Ref Range Status   MRSA by PCR NEGATIVE NEGATIVE Final    Comment:        The GeneXpert MRSA Assay (FDA approved for NASAL specimens only), is one component of a comprehensive MRSA colonization surveillance program. It is not intended to diagnose MRSA infection nor to guide or monitor treatment for MRSA infections.          Radiology Studies: Ct Head Wo Contrast  Result Date: 11/01/2016 CLINICAL DATA:  Status post facial trauma, with concern for head or maxillofacial injury. Initial encounter. EXAM: CT HEAD WITHOUT CONTRAST CT MAXILLOFACIAL WITHOUT CONTRAST TECHNIQUE: Multidetector CT imaging of the head and maxillofacial structures were performed using the standard protocol without intravenous contrast. Multiplanar CT image reconstructions of the maxillofacial structures were also generated. COMPARISON:  CT of the head and cervical spine performed 10/28/2016, and MRI of the brain performed 10/07/2007 FINDINGS: CT HEAD  FINDINGS Brain: No evidence of acute infarction, hemorrhage, hydrocephalus, extra-axial collection or mass lesion/mass effect. Prominence of the ventricles and sulci reflects mild cortical volume loss. Mild cerebellar atrophy is noted. Scattered periventricular and subcortical white matter change likely reflects small vessel ischemic microangiopathy. The brainstem and fourth ventricle are within normal limits. The basal ganglia are unremarkable in appearance. The cerebral hemispheres demonstrate grossly normal gray-white differentiation. No mass effect or midline shift is seen. Vascular: No hyperdense vessel or unexpected calcification. Skull: There is no evidence of fracture; visualized osseous structures are unremarkable in appearance. Other: Mild soft tissue swelling is noted overlying the left frontal calvarium. CT  MAXILLOFACIAL FINDINGS Osseous: There is no evidence of fracture or dislocation. The maxilla and mandible appear intact. The nasal bone is unremarkable in appearance. The visualized dentition demonstrates no acute abnormality. A small degenerative calcification is noted at the left temporomandibular joint. Orbits: The orbits are intact bilaterally. Sinuses: The visualized paranasal sinuses and mastoid air cells are well-aerated. Soft tissues: No significant soft tissue abnormalities are seen. The parapharyngeal fat planes are preserved. The nasopharynx, oropharynx and hypopharynx are unremarkable in appearance. The visualized portions of the valleculae and piriform sinuses are grossly unremarkable. The parotid and submandibular glands are within normal limits. No cervical lymphadenopathy is seen. IMPRESSION: 1. No evidence of traumatic intracranial injury or fracture. 2. No evidence of fracture or dislocation with regard to the maxillofacial structures. 3. Mild soft tissue swelling overlying the left frontal calvarium. 4. Mild cortical volume loss and scattered small vessel ischemic microangiopathy. 5. Small degenerative calcification at the left temporomandibular joint. Electronically Signed   By: Roanna Raider M.D.   On: 11/01/2016 20:32   Ct Maxillofacial Wo Contrast  Result Date: 11/01/2016 CLINICAL DATA:  Status post facial trauma, with concern for head or maxillofacial injury. Initial encounter. EXAM: CT HEAD WITHOUT CONTRAST CT MAXILLOFACIAL WITHOUT CONTRAST TECHNIQUE: Multidetector CT imaging of the head and maxillofacial structures were performed using the standard protocol without intravenous contrast. Multiplanar CT image reconstructions of the maxillofacial structures were also generated. COMPARISON:  CT of the head and cervical spine performed 10/28/2016, and MRI of the brain performed 10/07/2007 FINDINGS: CT HEAD FINDINGS Brain: No evidence of acute infarction, hemorrhage, hydrocephalus,  extra-axial collection or mass lesion/mass effect. Prominence of the ventricles and sulci reflects mild cortical volume loss. Mild cerebellar atrophy is noted. Scattered periventricular and subcortical white matter change likely reflects small vessel ischemic microangiopathy. The brainstem and fourth ventricle are within normal limits. The basal ganglia are unremarkable in appearance. The cerebral hemispheres demonstrate grossly normal gray-white differentiation. No mass effect or midline shift is seen. Vascular: No hyperdense vessel or unexpected calcification. Skull: There is no evidence of fracture; visualized osseous structures are unremarkable in appearance. Other: Mild soft tissue swelling is noted overlying the left frontal calvarium. CT MAXILLOFACIAL FINDINGS Osseous: There is no evidence of fracture or dislocation. The maxilla and mandible appear intact. The nasal bone is unremarkable in appearance. The visualized dentition demonstrates no acute abnormality. A small degenerative calcification is noted at the left temporomandibular joint. Orbits: The orbits are intact bilaterally. Sinuses: The visualized paranasal sinuses and mastoid air cells are well-aerated. Soft tissues: No significant soft tissue abnormalities are seen. The parapharyngeal fat planes are preserved. The nasopharynx, oropharynx and hypopharynx are unremarkable in appearance. The visualized portions of the valleculae and piriform sinuses are grossly unremarkable. The parotid and submandibular glands are within normal limits. No cervical lymphadenopathy is seen. IMPRESSION: 1. No evidence of traumatic intracranial  injury or fracture. 2. No evidence of fracture or dislocation with regard to the maxillofacial structures. 3. Mild soft tissue swelling overlying the left frontal calvarium. 4. Mild cortical volume loss and scattered small vessel ischemic microangiopathy. 5. Small degenerative calcification at the left temporomandibular joint.  Electronically Signed   By: Roanna RaiderJeffery  Chang M.D.   On: 11/01/2016 20:32        Scheduled Meds: .  ceFAZolin (ANCEF) IV  1 g Intravenous Q8H  . memantine  28 mg Oral QHS   And  . donepezil  10 mg Oral QHS  . ezetimibe  10 mg Oral Daily  . irbesartan  150 mg Oral Daily  . polyethylene glycol  17 g Oral BID  . senna-docusate  1 tablet Oral BID   Continuous Infusions: . lactated ringers 10 mL/hr at 11/01/16 1531     LOS: 5 days        Huey BienenstockELGERGAWY, Ellice Boultinghouse, MD [facility] Pager [phone]  If 7PM-7AM, please contact night-coverage [email or web address] Password [email or web address] 11/02/2016, 2:21 PM

## 2016-11-02 NOTE — Progress Notes (Signed)
Physical Therapy Treatment Patient Details Name: Vickie SnowballHelen M Hall MRN: [id number] DOB: June 24, 1928 Today's Date: 11/02/2016    History of Present Illness Vickie SnowballHelen M Hall is a 83 y.o. female, [facility], ambulate short distances in the ALF without the help of a cane or a walker, dependent on activities of daily living, PMH of fairly advanced dementia, HTN, HLD, vitamin D deficiency, osteoporosis presented to the ED following a mechanical fall at the facility followed by left-sided hip and wrist pain. Now s/p IM Nail fixation L femur, 30% PWB; wrist fracture with splint to be managed non-operatively, may weight bear through elbow    PT Comments    Patient required +2 assist for bed mobility and attempts to transfer OOB. Continue to progress as tolerated with anticipated d/c to SNF for further skilled PT services.    Follow Up Recommendations  SNF     Equipment Recommendations  Other (comment) (TBD at SNF)    Recommendations for Other Services OT consult     Precautions / Restrictions Precautions Precautions: Fall Restrictions Weight Bearing Restrictions: Yes LUE Weight Bearing: Weight bear through elbow only LLE Weight Bearing: Partial weight bearing LLE Partial Weight Bearing Percentage or Pounds: 30    Mobility  Bed Mobility Overal bed mobility: Needs Assistance Bed Mobility: Supine to Sit;Sit to Supine     Supine to sit: +2 for physical assistance;Max assist;HOB elevated Sit to supine: Total assist;+2 for physical assistance   General bed mobility comments: assist for all aspects of bed mobility pt assisted with elevating into sitting with R UE; use of bed pad  Transfers Overall transfer level: Needs assistance Equipment used: Left platform walker Transfers: Sit to/from Stand Sit to Stand: Max assist;Total assist;+2 physical assistance;From elevated surface         General transfer comment: assist to power up into standing while maintaining WB status  and L arm on platform; pt needs max cues for hand placement and technique; pt unable to maintain L LE WB status; use of bed pad to attempt upright posture; pt incontinent of urine upon second attempt to stand and returned to sitting EOB to get cleaned up  Ambulation/Gait                 Stairs            Wheelchair Mobility    Modified Rankin (Stroke Patients Only)       Balance Overall balance assessment: Needs assistance Sitting-balance support: Single extremity supported;Feet supported Sitting balance-Leahy Scale: Fair       Standing balance-Leahy Scale: Zero                      Cognition Arousal/Alertness: Awake/alert Behavior During Therapy: Anxious Overall Cognitive Status: History of cognitive impairments - at baseline                      Exercises      General Comments General comments (skin integrity, edema, etc.): sons and daughter in law present most of session      Pertinent Vitals/Pain Pain Assessment: Faces Faces Pain Scale: Hurts even more Pain Location: L hip with any motion Pain Descriptors / Indicators: Grimacing;Guarding;Sore Pain Intervention(s): Limited activity within patient's tolerance;Monitored during session;Repositioned    Home Living                      Prior Function  PT Goals (current goals can now be found in the care plan section) Acute Rehab PT Goals Patient Stated Goal: Unable to state PT Goal Formulation: Patient unable to participate in goal setting Time For Goal Achievement: 11/13/16 Potential to Achieve Goals: Good Progress towards PT goals: Not progressing toward goals - comment    Frequency    Min 3X/week      PT Plan Current plan remains appropriate    Co-evaluation             End of Session Equipment Utilized During Treatment: Gait belt Activity Tolerance: Patient limited by pain Patient left: with call bell/phone within reach;in bed;with SCD's  reapplied     Time: 1420-1457 PT Time Calculation (min) (ACUTE ONLY): 37 min  Charges:  $Therapeutic Activity: 23-37 mins                    G Codes:      Derek Mound, PTA Pager: [phone]   11/02/2016, 4:15 PM

## 2016-11-02 NOTE — Anesthesia Preprocedure Evaluation (Addendum)
Anesthesia Evaluation  Patient identified by MRN, date of birth, ID band Patient awake    Reviewed: Allergy & Precautions, NPO status , Patient's Chart, lab work & pertinent test results  Airway Mallampati: I  TM Distance: >3 FB Neck ROM: Full    Dental   Pulmonary    Pulmonary exam normal        Cardiovascular hypertension, Pt. on medications Normal cardiovascular exam     Neuro/Psych Dementia   GI/Hepatic GERD  Medicated and Controlled,  Endo/Other    Renal/GU      Musculoskeletal   Abdominal   Peds  Hematology   Anesthesia Other Findings   Reproductive/Obstetrics                            Anesthesia Physical Anesthesia Plan  ASA: III  Anesthesia Plan: Regional and MAC   Post-op Pain Management:    Induction: Intravenous  Airway Management Planned: Simple Face Mask  Additional Equipment:   Intra-op Plan:   Post-operative Plan:   Informed Consent: I have reviewed the patients History and Physical, chart, labs and discussed the procedure including the risks, benefits and alternatives for the proposed anesthesia with the patient or authorized representative who has indicated his/her understanding and acceptance.     Plan Discussed with: CRNA and Surgeon  Anesthesia Plan Comments:         Anesthesia Quick Evaluation

## 2016-11-03 DIAGNOSIS — N179 Acute kidney failure, unspecified: Secondary | ICD-10-CM

## 2016-11-03 DIAGNOSIS — R339 Retention of urine, unspecified: Secondary | ICD-10-CM

## 2016-11-03 LAB — BASIC METABOLIC PANEL
Anion gap: 9 (ref 5–15)
BUN: 30 mg/dL — ABNORMAL HIGH (ref 6–20)
CALCIUM: 8.3 mg/dL — AB (ref 8.9–10.3)
CO2: 24 mmol/L (ref 22–32)
CREATININE: 2.07 mg/dL — AB (ref 0.44–1.00)
Chloride: 102 mmol/L (ref 101–111)
GFR calc non Af Amer: 21 mL/min — ABNORMAL LOW (ref >60)
GFR, EST AFRICAN AMERICAN: 24 mL/min — AB (ref >60)
Glucose, Bld: 128 mg/dL — ABNORMAL HIGH (ref 65–99)
Potassium: 3.9 mmol/L (ref 3.5–5.1)
SODIUM: 135 mmol/L (ref 135–145)

## 2016-11-03 LAB — CBC
HCT: 24.8 % — ABNORMAL LOW (ref 36.0–46.0)
Hemoglobin: 8 g/dL — ABNORMAL LOW (ref 12.0–15.0)
MCH: 31 pg (ref 26.0–34.0)
MCHC: 32.3 g/dL (ref 30.0–36.0)
MCV: 96.1 fL (ref 78.0–100.0)
Platelets: 264 10*3/uL (ref 150–400)
RBC: 2.58 MIL/uL — ABNORMAL LOW (ref 3.87–5.11)
RDW: 14.7 % (ref 11.5–15.5)
WBC: 9.6 10*3/uL (ref 4.0–10.5)

## 2016-11-03 MED ORDER — ENOXAPARIN SODIUM 30 MG/0.3ML ~~LOC~~ SOLN
30.0000 mg | SUBCUTANEOUS | Status: DC
  Administered 2016-11-04 – 2016-11-05 (×2): 30 mg via SUBCUTANEOUS
  Filled 2016-11-03 (×2): qty 0.3

## 2016-11-03 MED ORDER — SODIUM CHLORIDE 0.9 % IV SOLN
INTRAVENOUS | Status: AC
  Administered 2016-11-03: 08:00:00 via INTRAVENOUS

## 2016-11-03 NOTE — Progress Notes (Signed)
Dr. Randol KernElgergawy requested bladder scan completed on patient.  The scan showed patient had 792ml of urine in bladder.  Dr. Randol KernElgergawy said to place a foley catheter.

## 2016-11-03 NOTE — Progress Notes (Signed)
Patient has foley catheter due to urinary retention.  The foley was placed on the 5th attempt.  With the 3316fr there was resistance in the urethra, so 714fr was used.  Vickie Hall, [place], Vickie Hall, [place], [place], RN, Vickie ShoresAnita, RN all attempted to place foley.  Vickie Hall was successful in placing foley, urine return was noted.  Sterile technique was followed with each insertion.  Nursing continue to monitor.

## 2016-11-03 NOTE — Progress Notes (Addendum)
PHARMACY NOTE:  ANTIMICROBIAL RENAL DOSAGE ADJUSTMENT  Current antimicrobial regimen includes a mismatch between antimicrobial dosage and estimated renal function.  As per policy approved by the Pharmacy & Therapeutics and Medical Executive Committees, the antimicrobial dosage will be adjusted accordingly.  Current antimicrobial dosage:  Ancef 1g IV q8h  Indication: Surgical prophylaxis x48 hours  Renal Function: estimated CrCl 20 (SCr doubled overnight from 0.9 to 2.07)  CrCl cannot be calculated (Unknown ideal weight.). []      On intermittent HD, scheduled: []      On CRRT    Antimicrobial dosage has been changed to:  Discontinued due to coverage for 48 hours complete with sharp decline in kidney function.   Additional comments: Also adjusted Lovenox to 30mg  daily with CrCl <30 mL/min.    Thank you for allowing pharmacy to be a part of this patient's care.  Fayne NorrieMillen, Tashaya Ancrum Brown, [facility] HospitalRPH 11/03/2016 2:10 PM

## 2016-11-03 NOTE — Progress Notes (Addendum)
PROGRESS NOTE  Vickie SnowballHelen M Hall  ZOX:[id number] DOB: 10/02/1927  DOA: 10/28/2016 PCP: Rudi HeapMOORE, DONALD, MD   Brief Narrative:  83 y.o. female, [facility] [facility], ambulate short distances in the ALF without the help of a cane or a walker, dependent on activities of daily living, PMH of fairly advanced dementia, HTN, HLD, vitamin D deficiency, osteoporosis presented to the ED following a mechanical fall at the facility followed by left-sided hip and wrist pain. [facility] and [facility] for [facility] and management by orthopedics. Went for surgical fixation of left hip fracture on 10/29/16 afternoon, and went for open reduction internal fixation of left wrist fracture on 2/15   Assessment & Plan:   Principal Problem:   Closed left hip fracture (HCC) Active Problems:   GERD (gastroesophageal reflux disease)   HTN (hypertension)   Hyperlipidemia   Dementia due to Alzheimer's disease   Closed pertrochanteric fracture of femur, left, initial encounter (HCC)   Acute closed left hip fracture (communited intertrochanteric fracture with mild displacement):  - Orthopedic consult greatly appreciated, went for surgical repair 2/12, continue with when necessary pain medication, will need DVT prophylaxis for the next 30 days.  Left wrist fracture: -  hand Surgery consult greatly appreciated, went for open reduction internal fixation of left wrist fracture on 2/15, continue with IV antibiotics for 48 hours postoperatively  Postoperative anemia:  - expected postoperative drop, continue to monitor,  hemoglobin of 8, we'll transfuse if needed, will start on iron supplements before discharge  Acute renal failure - Creatinine with significant increase to 2.07 today, evidence of urinary retention, Foley catheter inserted, stop the verapamil, continue with fluids  Urinary retention - Foley catheter inserted  Essential hypertension:     - reasonable inpatient control. Holding ARB today giving her acute renal failure  Hyperlipidemia:  - Continue Zetia.  Advanced dementia:  - As per son, Alzheimer's dementia. No behavioral abnormalities. Continue home dose of Namzaric.  Osteoporosis and vitamin D deficiency:  - Hold medications while hospitalized and may resume at discharge.  Sinus bradycardia:   - TSH normal. Repeat EKG on 10/28/16 shows sinus rhythm at 62 bpm  She is asymptomatic.  Leukocytosis:  - Possibly stress response. No symptoms or signs suggestive of infection. Chest x-ray and urine microscopy unremarkable. resolved     DVT prophylaxis: Lovenox Code Status: DO NOT RESUSCITATE Family Communication: Discussed with son via phone 2/16 Disposition Plan: to SNF, in 1 to 2 days    Consultants:   Orthopedics Dr Veda Canningswintek  Hand surgery Dr Cliffton AstersGraming  Procedures:   Intramedullary fixation of left femur by Dr. Linna CapriceSwinteck on 10/29/2016  Antimicrobials:   None    Subjective: Seen this morning. Denied complaints. Denied pain. Denies any shortness of breath, nausea vomiting or headache Objective:  Vitals:   11/02/16 1500 11/02/16 2148 11/03/16 0035 11/03/16 0635  BP: (!) 117/58 135/63 (!) 153/76 (!) 152/74  Pulse: 78 69 70 64  Resp: 16 16 16 16   Temp: 98.2 F (36.8 C) 98 F (36.7 C) 98.1 F (36.7 C) 99 F (37.2 C)  TempSrc: Oral Oral Oral Oral  SpO2: 93% 96% 97% 95%    Intake/Output Summary (Last 24 hours) at 11/03/16 1209 Last data filed at 11/02/16 1700  Gross per 24 hour  Intake              240 ml  Output  2 ml  Net              238 ml   There were no vitals filed for this visit.  Examination:  General exam: Pleasant elderly female lying comfortably supine in bed. Respiratory system: Clear to auscultation. Respiratory effort normal. Cardiovascular system: S1 & S2 heard, RRR. No JVD, murmurs, rubs, gallops or clicks. No pedal edema. Gastrointestinal system: Abdomen is  nondistended, soft , Had mild suprapubic tenderness and distention. No organomegaly or masses felt. Normal bowel sounds heard. Central nervous system: Alert and oriented only to self. No focal neurological deficits. Extremities:  Left forearm Wrapped Good pulses, no cyanosis in bilateral lower extremities Skin: No rashes, lesions or ulcers Psychiatry: Judgement and insight impaired. Mood & affect appropriate.     Data Reviewed: I have personally reviewed following labs and imaging studies  CBC:  Recent Labs Lab 10/28/16 1910 10/30/16 0347 10/31/16 0436 11/02/16 0537 11/03/16 0519  WBC 12.0* 9.3 8.9 8.9 9.6  NEUTROABS 9.7*  --   --   --   --   HGB 13.5 9.4* 9.6* 8.0* 8.0*  HCT 41.3 28.6* 28.5* 23.9* 24.8*  MCV 96.7 95.7 94.7 94.8 96.1  PLT 232 167 151 233 264   Basic Metabolic Panel:  Recent Labs Lab 10/28/16 1910 10/30/16 0347 10/31/16 0436 11/01/16 2035 11/03/16 0519  NA 133* 136 133* 135 135  K 4.1 3.9 3.6 4.1 3.9  CL 99* 101 100* 99* 102  CO2 25 24 22 24 24   GLUCOSE 155* 126* 119* 117* 128*  BUN 12 11 11 12  30*  CREATININE 0.74 0.67 0.62 0.90 2.07*  CALCIUM 8.6* 7.7* 8.0* 8.4* 8.3*   GFR: CrCl cannot be calculated (Unknown ideal weight.). Liver Function Tests: No results for input(s): AST, ALT, ALKPHOS, BILITOT, PROT, ALBUMIN in the last 168 hours. No results for input(s): LIPASE, AMYLASE in the last 168 hours. No results for input(s): AMMONIA in the last 168 hours. Coagulation Profile: No results for input(s): INR, PROTIME in the last 168 hours. Cardiac Enzymes: No results for input(s): CKTOTAL, CKMB, CKMBINDEX, TROPONINI in the last 168 hours. BNP (last 3 results) No results for input(s): PROBNP in the last 8760 hours. HbA1C: No results for input(s): HGBA1C in the last 72 hours. CBG: No results for input(s): GLUCAP in the last 168 hours. Lipid Profile: No results for input(s): CHOL, HDL, LDLCALC, TRIG, CHOLHDL, LDLDIRECT in the last 72 hours. Thyroid  Function Tests: No results for input(s): TSH, T4TOTAL, FREET4, T3FREE, THYROIDAB in the last 72 hours. Anemia Panel: No results for input(s): VITAMINB12, FOLATE, FERRITIN, TIBC, IRON, RETICCTPCT in the last 72 hours.  Sepsis Labs:  Recent Labs Lab 10/30/16 0347 10/31/16 0436 11/02/16 0537 11/03/16 0519  WBC 9.3 8.9 8.9 9.6    Recent Results (from the past 240 hour(s))  MRSA PCR Screening     Status: None   Collection Time: 10/29/16  6:37 AM  Result Value Ref Range Status   MRSA by PCR NEGATIVE NEGATIVE Final    Comment:        The GeneXpert MRSA Assay (FDA approved for NASAL specimens only), is one component of a comprehensive MRSA colonization surveillance program. It is not intended to diagnose MRSA infection nor to guide or monitor treatment for MRSA infections.          Radiology Studies: Ct Head Wo Contrast  Result Date: 11/01/2016 CLINICAL DATA:  Status post facial trauma, with concern for head or maxillofacial injury. Initial encounter. EXAM: CT  HEAD WITHOUT CONTRAST CT MAXILLOFACIAL WITHOUT CONTRAST TECHNIQUE: Multidetector CT imaging of the head and maxillofacial structures were performed using the standard protocol without intravenous contrast. Multiplanar CT image reconstructions of the maxillofacial structures were also generated. COMPARISON:  CT of the head and cervical spine performed 10/28/2016, and MRI of the brain performed 10/07/2007 FINDINGS: CT HEAD FINDINGS Brain: No evidence of acute infarction, hemorrhage, hydrocephalus, extra-axial collection or mass lesion/mass effect. Prominence of the ventricles and sulci reflects mild cortical volume loss. Mild cerebellar atrophy is noted. Scattered periventricular and subcortical white matter change likely reflects small vessel ischemic microangiopathy. The brainstem and fourth ventricle are within normal limits. The basal ganglia are unremarkable in appearance. The cerebral hemispheres demonstrate grossly normal  gray-white differentiation. No mass effect or midline shift is seen. Vascular: No hyperdense vessel or unexpected calcification. Skull: There is no evidence of fracture; visualized osseous structures are unremarkable in appearance. Other: Mild soft tissue swelling is noted overlying the left frontal calvarium. CT MAXILLOFACIAL FINDINGS Osseous: There is no evidence of fracture or dislocation. The maxilla and mandible appear intact. The nasal bone is unremarkable in appearance. The visualized dentition demonstrates no acute abnormality. A small degenerative calcification is noted at the left temporomandibular joint. Orbits: The orbits are intact bilaterally. Sinuses: The visualized paranasal sinuses and mastoid air cells are well-aerated. Soft tissues: No significant soft tissue abnormalities are seen. The parapharyngeal fat planes are preserved. The nasopharynx, oropharynx and hypopharynx are unremarkable in appearance. The visualized portions of the valleculae and piriform sinuses are grossly unremarkable. The parotid and submandibular glands are within normal limits. No cervical lymphadenopathy is seen. IMPRESSION: 1. No evidence of traumatic intracranial injury or fracture. 2. No evidence of fracture or dislocation with regard to the maxillofacial structures. 3. Mild soft tissue swelling overlying the left frontal calvarium. 4. Mild cortical volume loss and scattered small vessel ischemic microangiopathy. 5. Small degenerative calcification at the left temporomandibular joint. Electronically Signed   By: Roanna Raider M.D.   On: 11/01/2016 20:32   Ct Maxillofacial Wo Contrast  Result Date: 11/01/2016 CLINICAL DATA:  Status post facial trauma, with concern for head or maxillofacial injury. Initial encounter. EXAM: CT HEAD WITHOUT CONTRAST CT MAXILLOFACIAL WITHOUT CONTRAST TECHNIQUE: Multidetector CT imaging of the head and maxillofacial structures were performed using the standard protocol without intravenous  contrast. Multiplanar CT image reconstructions of the maxillofacial structures were also generated. COMPARISON:  CT of the head and cervical spine performed 10/28/2016, and MRI of the brain performed 10/07/2007 FINDINGS: CT HEAD FINDINGS Brain: No evidence of acute infarction, hemorrhage, hydrocephalus, extra-axial collection or mass lesion/mass effect. Prominence of the ventricles and sulci reflects mild cortical volume loss. Mild cerebellar atrophy is noted. Scattered periventricular and subcortical white matter change likely reflects small vessel ischemic microangiopathy. The brainstem and fourth ventricle are within normal limits. The basal ganglia are unremarkable in appearance. The cerebral hemispheres demonstrate grossly normal gray-white differentiation. No mass effect or midline shift is seen. Vascular: No hyperdense vessel or unexpected calcification. Skull: There is no evidence of fracture; visualized osseous structures are unremarkable in appearance. Other: Mild soft tissue swelling is noted overlying the left frontal calvarium. CT MAXILLOFACIAL FINDINGS Osseous: There is no evidence of fracture or dislocation. The maxilla and mandible appear intact. The nasal bone is unremarkable in appearance. The visualized dentition demonstrates no acute abnormality. A small degenerative calcification is noted at the left temporomandibular joint. Orbits: The orbits are intact bilaterally. Sinuses: The visualized paranasal sinuses and mastoid air cells are  well-aerated. Soft tissues: No significant soft tissue abnormalities are seen. The parapharyngeal fat planes are preserved. The nasopharynx, oropharynx and hypopharynx are unremarkable in appearance. The visualized portions of the valleculae and piriform sinuses are grossly unremarkable. The parotid and submandibular glands are within normal limits. No cervical lymphadenopathy is seen. IMPRESSION: 1. No evidence of traumatic intracranial injury or fracture. 2. No  evidence of fracture or dislocation with regard to the maxillofacial structures. 3. Mild soft tissue swelling overlying the left frontal calvarium. 4. Mild cortical volume loss and scattered small vessel ischemic microangiopathy. 5. Small degenerative calcification at the left temporomandibular joint. Electronically Signed   By: Roanna Raider M.D.   On: 11/01/2016 20:32        Scheduled Meds: .  ceFAZolin (ANCEF) IV  1 g Intravenous Q8H  . memantine  28 mg Oral QHS   And  . donepezil  10 mg Oral QHS  . enoxaparin (LOVENOX) injection  40 mg Subcutaneous Q24H  . ezetimibe  10 mg Oral Daily  . polyethylene glycol  17 g Oral BID  . senna-docusate  1 tablet Oral BID   Continuous Infusions: . sodium chloride 75 mL/hr at 11/03/16 0755  . lactated ringers 10 mL/hr at 11/01/16 1531     LOS: 6 days        Huey Bienenstock, MD [facility] Pager [phone]  If 7PM-7AM, please contact night-coverage [email or web address] Password [facility] 11/03/2016, 12:09 PM

## 2016-11-04 ENCOUNTER — Inpatient Hospital Stay (HOSPITAL_COMMUNITY): Payer: Medicare HMO

## 2016-11-04 DIAGNOSIS — D62 Acute posthemorrhagic anemia: Secondary | ICD-10-CM

## 2016-11-04 LAB — PREPARE RBC (CROSSMATCH)

## 2016-11-04 LAB — BASIC METABOLIC PANEL
Anion gap: 7 (ref 5–15)
BUN: 14 mg/dL (ref 6–20)
CHLORIDE: 103 mmol/L (ref 101–111)
CO2: 27 mmol/L (ref 22–32)
Calcium: 7.9 mg/dL — ABNORMAL LOW (ref 8.9–10.3)
Creatinine, Ser: 0.64 mg/dL (ref 0.44–1.00)
GFR calc Af Amer: >60 mL/min (ref >60)
GFR calc non Af Amer: >60 mL/min (ref >60)
Glucose, Bld: 104 mg/dL — ABNORMAL HIGH (ref 65–99)
Potassium: 3.7 mmol/L (ref 3.5–5.1)
Sodium: 137 mmol/L (ref 135–145)

## 2016-11-04 LAB — CBC
HEMATOCRIT: 23.9 % — AB (ref 36.0–46.0)
HEMOGLOBIN: 7.9 g/dL — AB (ref 12.0–15.0)
MCH: 32.5 pg (ref 26.0–34.0)
MCHC: 33.1 g/dL (ref 30.0–36.0)
MCV: 98.4 fL (ref 78.0–100.0)
Platelets: 238 10*3/uL (ref 150–400)
RBC: 2.43 MIL/uL — ABNORMAL LOW (ref 3.87–5.11)
RDW: 15.1 % (ref 11.5–15.5)
WBC: 6.4 10*3/uL (ref 4.0–10.5)

## 2016-11-04 LAB — HEMOGLOBIN AND HEMATOCRIT, BLOOD
HEMATOCRIT: 29.3 % — AB (ref 36.0–46.0)
HEMOGLOBIN: 9.3 g/dL — AB (ref 12.0–15.0)

## 2016-11-04 LAB — ABO/RH: ABO/RH(D): O POS

## 2016-11-04 MED ORDER — SODIUM CHLORIDE 0.9 % IV SOLN
Freq: Once | INTRAVENOUS | Status: AC
  Administered 2016-11-04: 09:00:00 via INTRAVENOUS

## 2016-11-04 MED ORDER — FUROSEMIDE 10 MG/ML IJ SOLN
20.0000 mg | Freq: Once | INTRAMUSCULAR | Status: AC
  Administered 2016-11-04: 20 mg via INTRAVENOUS
  Filled 2016-11-04: qty 2

## 2016-11-04 MED ORDER — SENNOSIDES-DOCUSATE SODIUM 8.6-50 MG PO TABS
2.0000 | ORAL_TABLET | Freq: Two times a day (BID) | ORAL | Status: DC
  Administered 2016-11-05: 2 via ORAL
  Filled 2016-11-04: qty 2

## 2016-11-04 MED ORDER — ENSURE ENLIVE PO LIQD
237.0000 mL | Freq: Two times a day (BID) | ORAL | Status: DC
  Administered 2016-11-04 – 2016-11-05 (×4): 237 mL via ORAL

## 2016-11-04 MED ORDER — MAGNESIUM CITRATE PO SOLN
0.5000 | Freq: Once | ORAL | Status: AC
  Administered 2016-11-04: 0.5 via ORAL
  Filled 2016-11-04: qty 296

## 2016-11-04 NOTE — Progress Notes (Signed)
PROGRESS NOTE  Vickie Hall  BJY:[id number] DOB: 06-21-1928  DOA: 10/28/2016 PCP: Rudi Heap, MD   Brief Narrative:  83 y.o. female, resident of [address] [facility], ambulate short distances in the ALF without the help of a cane or a walker, dependent on activities of daily living, PMH of fairly advanced dementia, HTN, HLD, vitamin D deficiency, osteoporosis presented to the ED following a mechanical fall at the facility followed by left-sided hip and wrist pain. She was initially taken to [facility] ED and then transferred to [facility] for evaluation and management by orthopedics. Went for surgical fixation of left hip fracture on 10/29/16 afternoon, and went for open reduction internal fixation of left wrist fracture on 2/15   Assessment & Plan:   Principal Problem:   Closed left hip fracture (HCC) Active Problems:   GERD (gastroesophageal reflux disease)   HTN (hypertension)   Hyperlipidemia   Dementia due to Alzheimer's disease   Closed pertrochanteric fracture of femur, left, initial encounter (HCC)   Acute closed left hip fracture (communited intertrochanteric fracture with mild displacement):  - Orthopedic consult greatly appreciated, went for surgical repair 2/12, continue with when necessary pain medication, will need DVT prophylaxis for the next 30 days.  Left wrist fracture: -  hand Surgery consult greatly appreciated, went for open reduction internal fixation of left wrist fracture on 2/15, received IV antibiotics for 48 hours postoperatively  Postoperative anemia:  - expected postoperative drop, continue to monitor,  hemoglobin of 8, we'll transfuse if needed, will start on iron supplements before discharge - hemoglobin of 7.9 today, will transfuse 1 unit PRBC  Acute renal failure - Creatinine with significant increase to 2.07y evidence of urinary retention, Foley catheter inserted, Avapro was put on hold , gentle hydration, renal failure resolved with  creatinine back to baseline today .   Urinary retention - Foley catheter inserted, had 1150 cc and output after Foley catheter insertion, will need  a voiding trial in the outpatient setting when patient is more ambulatory  Essential hypertension:   - reasonable inpatient control. Holding ARB today giving her acute renal failure  Hyperlipidemia:  - Continue Zetia.  Advanced dementia:  - As per son, Alzheimer's dementia. No behavioral abnormalities. Continue home dose of Namzaric.  Osteoporosis and vitamin D deficiency:  - Hold medications while hospitalized and may resume at discharge.  Sinus bradycardia:   - TSH normal. Repeat EKG on 10/28/16 shows sinus rhythm at 62 bpm  She is asymptomatic.  Leukocytosis:  - Possibly stress response. No symptoms or signs suggestive of infection. Chest x-ray and urine microscopy unremarkable. resolved     DVT prophylaxis: Lovenox Code Status: DO NOT RESUSCITATE Family Communication: Discussed with son via phone 2/16 Disposition Plan: to SNF, in 1 to 2 days    Consultants:   Orthopedics Dr Veda Canning  Hand surgery Dr Cliffton Asters  Procedures:   Intramedullary fixation of left femur by Dr. Linna Caprice on 10/29/2016 open reduction internal fixation of left wrist fracture on 2/15,  Antimicrobials:   None    Subjective: Seen this morning. Denied complaints. Denied pain. Denies any shortness of breath, nausea vomiting or headache Objective:  Vitals:   11/03/16 2020 11/04/16 0621 11/04/16 1112 11/04/16 1132  BP: (!) 126/57 137/63 (!) 126/56 (!) 126/52  Pulse: 67 (!) 59 (!) 55 (!) 51  Resp: 16 16 18 18   Temp: 99.3 F (37.4 C) 98 F (36.7 C) 98.3 F (36.8 C) 98.4 F (36.9 C)  TempSrc: Oral Axillary  Oral Oral  SpO2: 93% 93% 95% 95%    Intake/Output Summary (Last 24 hours) at 11/04/16 1334 Last data filed at 11/04/16 1101  Gross per 24 hour  Intake          1601.42 ml  Output             1550 ml  Net            51.42 ml   There  were no vitals filed for this visit.  Examination:  General exam: Pleasant elderly female lying comfortably supine in bed. Respiratory system: Clear to auscultation. Respiratory effort normal. Cardiovascular system: S1 & S2 heard, RRR. No JVD, murmurs, rubs, gallops or clicks. No pedal edema. Gastrointestinal system: Abdomen is nondistended, soft No distention. Normal bowel sounds heard. Central nervous system: Alert and oriented only to self. No focal neurological deficits. Extremities:  Left forearm Wrapped with Good pulses, no cyanosis in bilateral lower extremities Skin: No rashes, lesions or ulcers Psychiatry: Judgement and insight impaired. Mood & affect appropriate.     Data Reviewed: I have personally reviewed following labs and imaging studies  CBC:  Recent Labs Lab 10/28/16 1910 10/30/16 0347 10/31/16 0436 11/02/16 0537 11/03/16 0519 11/04/16 0429  WBC 12.0* 9.3 8.9 8.9 9.6 6.4  NEUTROABS 9.7*  --   --   --   --   --   HGB 13.5 9.4* 9.6* 8.0* 8.0* 7.9*  HCT 41.3 28.6* 28.5* 23.9* 24.8* 23.9*  MCV 96.7 95.7 94.7 94.8 96.1 98.4  PLT 232 167 151 233 264 238   Basic Metabolic Panel:  Recent Labs Lab 10/30/16 0347 10/31/16 0436 11/01/16 2035 11/03/16 0519 11/04/16 0429  NA 136 133* 135 135 137  K 3.9 3.6 4.1 3.9 3.7  CL 101 100* 99* 102 103  CO2 24 22 24 24 27   GLUCOSE 126* 119* 117* 128* 104*  BUN 11 11 12  30* 14  CREATININE 0.67 0.62 0.90 2.07* 0.64  CALCIUM 7.7* 8.0* 8.4* 8.3* 7.9*   GFR: CrCl cannot be calculated (Unknown ideal weight.). Liver Function Tests: No results for input(s): AST, ALT, ALKPHOS, BILITOT, PROT, ALBUMIN in the last 168 hours. No results for input(s): LIPASE, AMYLASE in the last 168 hours. No results for input(s): AMMONIA in the last 168 hours. Coagulation Profile: No results for input(s): INR, PROTIME in the last 168 hours. Cardiac Enzymes: No results for input(s): CKTOTAL, CKMB, CKMBINDEX, TROPONINI in the last 168  hours. BNP (last 3 results) No results for input(s): PROBNP in the last 8760 hours. HbA1C: No results for input(s): HGBA1C in the last 72 hours. CBG: No results for input(s): GLUCAP in the last 168 hours. Lipid Profile: No results for input(s): CHOL, HDL, LDLCALC, TRIG, CHOLHDL, LDLDIRECT in the last 72 hours. Thyroid Function Tests: No results for input(s): TSH, T4TOTAL, FREET4, T3FREE, THYROIDAB in the last 72 hours. Anemia Panel: No results for input(s): VITAMINB12, FOLATE, FERRITIN, TIBC, IRON, RETICCTPCT in the last 72 hours.  Sepsis Labs:  Recent Labs Lab 10/31/16 0436 11/02/16 0537 11/03/16 0519 11/04/16 0429  WBC 8.9 8.9 9.6 6.4    Recent Results (from the past 240 hour(s))  MRSA PCR Screening     Status: None   Collection Time: 10/29/16  6:37 AM  Result Value Ref Range Status   MRSA by PCR NEGATIVE NEGATIVE Final    Comment:        The GeneXpert MRSA Assay (FDA approved for NASAL specimens only), is one component of a comprehensive MRSA colonization  surveillance program. It is not intended to diagnose MRSA infection nor to guide or monitor treatment for MRSA infections.          Radiology Studies: Dg Chest Port 1 View  Result Date: 11/04/2016 CLINICAL DATA:  Hypoxia EXAM: PORTABLE CHEST 1 VIEW COMPARISON:  October 28, 2016 FINDINGS: There is no edema or consolidation. Heart size and pulmonary vascularity are normal. No adenopathy. There is aortic atherosclerosis. No bone lesions. IMPRESSION: Aortic atherosclerosis.  No edema or consolidation. Electronically Signed   By: Bretta BangWilliam  Woodruff III M.D.   On: 11/04/2016 08:36   Dg Abd Portable 1v  Result Date: 11/04/2016 CLINICAL DATA:  Abdominal pain EXAM: PORTABLE ABDOMEN - 1 VIEW COMPARISON:  10/28/2016 FINDINGS: Nonobstructive bowel gas pattern. Moderate stool volume. Atherosclerosis. Subtle left renal calculi, confirmed on 10/28/2016 abdominal CT. Hypertrophic transverse processes at the lumbosacral  junction. IMPRESSION: No acute finding. Moderate stool volume. No evidence of obstruction or rectal impaction. Electronically Signed   By: Marnee SpringJonathon  Watts M.D.   On: 11/04/2016 12:40        Scheduled Meds: . memantine  28 mg Oral QHS   And  . donepezil  10 mg Oral QHS  . enoxaparin (LOVENOX) injection  30 mg Subcutaneous Q24H  . ezetimibe  10 mg Oral Daily  . feeding supplement (ENSURE ENLIVE)  237 mL Oral BID BM  . polyethylene glycol  17 g Oral BID  . senna-docusate  1 tablet Oral BID   Continuous Infusions: . lactated ringers 10 mL/hr at 11/01/16 1531     LOS: 7 days        Huey BienenstockELGERGAWY, Yasmyn Bellisario, MD [facility] Pager [phone]  If 7PM-7AM, please contact night-coverage [email or web address] Password [email or web address] 11/04/2016, 1:34 PM

## 2016-11-04 NOTE — Progress Notes (Signed)
Patient ID: Vickie SnowballHelen M Baltz, female   DOB: [birth year], 83 y.o.   MRN: [id number] Patient more alert Left arm /hand look good=NVI,minimal STS and good ROM given her DJD Stable from my standpoint Prefer platform walker with weight through left elbow and avoid weight bearing on her wrist Will see in follow up at 2 weeks Shelsy Seng MD

## 2016-11-04 NOTE — [place]2 (Signed)
[place] MEDICAID FL2 LEVEL OF CARE SCREENING TOOL     IDENTIFICATION  Patient Name: Vickie Hall Birthdate: 15-Jun-1928 Sex: female Admission Date (Current Location): 10/28/2016  [facility] [place]   Facility and Address:  [facility], [address], [place], [place] [address]      Provider Number: [id number]  Attending Physician Name and Address:  Starleen Armsawood S Elgergawy, MD  Relative Name and Phone Number:       Current Level of Care: Hospital Recommended Level of Care: Assisted Living Facility Prior Approval Number:    Date Approved/Denied:   PASRR Number: Pending  Discharge Plan: Other (Comment) (ALF)    Current Diagnoses: Patient Active Problem List   Diagnosis Date Noted  . Closed pertrochanteric fracture of femur, left, initial encounter (HCC) 10/29/2016  . Closed left hip fracture (HCC) 10/28/2016  . Dementia due to Alzheimer's disease 10/28/2016  . Aortic atherosclerosis (HCC) 05/29/2016  . Osteoporosis, senile 12/16/2013  . GERD (gastroesophageal reflux disease) 11/11/2013  . HTN (hypertension) 11/11/2013  . Hyperlipidemia 11/11/2013  . Memory deficit 11/11/2013    Orientation RESPIRATION BLADDER Height & Weight     Self  O2 (Nasal Cannula, 2L) Continent Weight:   Height:     BEHAVIORAL SYMPTOMS/MOOD NEUROLOGICAL BOWEL NUTRITION STATUS      Continent  (Please see discharge summary)  AMBULATORY STATUS COMMUNICATION OF NEEDS Skin   Limited Assist Verbally Surgical wounds (Closed incision left leg, Hydrocolloid dressing)                       Personal Care Assistance Level of Assistance  Bathing, Feeding, Dressing Bathing Assistance: Limited assistance Feeding assistance: Independent Dressing Assistance: Limited assistance     Functional Limitations Info  Sight, Hearing, Speech Sight Info: Adequate Hearing Info: Adequate Speech Info: Adequate    SPECIAL CARE FACTORS FREQUENCY  PT (By licensed  PT), OT (By licensed OT)     PT Frequency: 3x week OT Frequency: 3x week            Contractures Contractures Info: Not present    Additional Factors Info  Code Status, Allergies Code Status Info: DNR Allergies Info: Actonel Risedronate Sodium, Lipitor Atorvastatin           Current Medications (11/04/2016):  This is the current hospital active medication list Current Facility-Administered Medications  Medication Dose Route Frequency Provider Last Rate Last Dose  . acetaminophen (TYLENOL) tablet 650 mg  650 mg Oral Q6H PRN Elease EtienneAnand D Hongalgi, MD   650 mg at 11/03/16 [id number]   Or  . acetaminophen (TYLENOL) suppository 650 mg  650 mg Rectal Q6H PRN Elease EtienneAnand D Hongalgi, MD      . albuterol (PROVENTIL) (2.5 MG/3ML) 0.083% nebulizer solution 2.5 mg  2.5 mg Nebulization Q2H PRN Elease EtienneAnand D Hongalgi, MD      . alum & mag hydroxide-simeth (MAALOX/MYLANTA) 200-200-20 MG/5ML suspension 30 mL  30 mL Oral PRN Starleen Armsawood S Elgergawy, MD      . memantine (NAMENDA XR) 24 hr capsule 28 mg  28 mg Oral QHS Elease EtienneAnand D Hongalgi, MD   28 mg at 11/03/16 2107   And  . donepezil (ARICEPT) tablet 10 mg  10 mg Oral QHS Elease EtienneAnand D Hongalgi, MD   10 mg at 11/03/16 2107  . enoxaparin (LOVENOX) injection 30 mg  30 mg Subcutaneous Q24H Dawood S Elgergawy, MD      . ezetimibe (ZETIA) tablet 10 mg  10 mg  Oral Daily Elease Etienne, MD   10 mg at 11/03/16 0946  . HYDROcodone-acetaminophen (NORCO/VICODIN) 5-325 MG per tablet 1-2 tablet  1-2 tablet Oral Q6H PRN Samson Frederic, MD   1 tablet at 10/30/16 1136  . lactated ringers infusion   Intravenous Continuous Dominica Severin, MD 10 mL/hr at 11/01/16 1531    . menthol-cetylpyridinium (CEPACOL) lozenge 3 mg  1 lozenge Oral PRN Samson Frederic, MD       Or  . phenol (CHLORASEPTIC) mouth spray 1 spray  1 spray Mouth/Throat PRN Samson Frederic, MD      . metoCLOPramide (REGLAN) tablet 5-10 mg  5-10 mg Oral Q8H PRN Samson Frederic, MD       Or  . metoCLOPramide (REGLAN) injection 5-10 mg   5-10 mg Intravenous Q8H PRN Samson Frederic, MD      . morphine 2 MG/ML injection 0.5 mg  0.5 mg Intravenous Q2H PRN Samson Frederic, MD      . ondansetron [facility]) tablet 4 mg  4 mg Oral Q6H PRN Elease Etienne, MD   4 mg at 11/03/16 1610   Or  . ondansetron (ZOFRAN) injection 4 mg  4 mg Intravenous Q6H PRN Elease Etienne, MD      . oxyCODONE (Oxy IR/ROXICODONE) immediate release tablet 5 mg  5 mg Oral Q6H PRN Elease Etienne, MD   5 mg at 11/04/16 0853  . polyethylene glycol (MIRALAX / GLYCOLAX) packet 17 g  17 g Oral BID Starleen Arms, MD   17 g at 11/03/16 0943  . senna-docusate (Senokot-S) tablet 1 tablet  1 tablet Oral BID Starleen Arms, MD   1 tablet at 11/03/16 2107     Discharge Medications: Please see discharge summary for a list of discharge medications.  Relevant Imaging Results:  Relevant Lab Results:   Additional Information SSN: [id number]  Linna Caprice, LCSW

## 2016-11-05 DIAGNOSIS — G309 Alzheimer's disease, unspecified: Secondary | ICD-10-CM

## 2016-11-05 DIAGNOSIS — F0280 Dementia in other diseases classified elsewhere without behavioral disturbance: Secondary | ICD-10-CM

## 2016-11-05 LAB — BASIC METABOLIC PANEL
Anion gap: 8 (ref 5–15)
BUN: 12 mg/dL (ref 6–20)
CO2: 29 mmol/L (ref 22–32)
CREATININE: 0.62 mg/dL (ref 0.44–1.00)
Calcium: 8.1 mg/dL — ABNORMAL LOW (ref 8.9–10.3)
Chloride: 101 mmol/L (ref 101–111)
GFR calc Af Amer: >60 mL/min (ref >60)
GFR calc non Af Amer: >60 mL/min (ref >60)
Glucose, Bld: 102 mg/dL — ABNORMAL HIGH (ref 65–99)
Potassium: 4 mmol/L (ref 3.5–5.1)
SODIUM: 138 mmol/L (ref 135–145)

## 2016-11-05 LAB — CBC
HCT: 28.2 % — ABNORMAL LOW (ref 36.0–46.0)
Hemoglobin: 9.1 g/dL — ABNORMAL LOW (ref 12.0–15.0)
MCH: 31.1 pg (ref 26.0–34.0)
MCHC: 32.3 g/dL (ref 30.0–36.0)
MCV: 96.2 fL (ref 78.0–100.0)
PLATELETS: 296 10*3/uL (ref 150–400)
RBC: 2.93 MIL/uL — AB (ref 3.87–5.11)
RDW: 15.9 % — AB (ref 11.5–15.5)
WBC: 6.5 10*3/uL (ref 4.0–10.5)

## 2016-11-05 LAB — TYPE AND SCREEN
ABO/RH(D): O POS
Antibody Screen: NEGATIVE
Unit division: 0

## 2016-11-05 MED ORDER — HYDROCODONE-ACETAMINOPHEN 5-325 MG PO TABS: 1.0000 | ORAL_TABLET | Freq: Four times a day (QID) | ORAL | 0 refills | Status: DC | PRN

## 2016-11-05 MED ORDER — FERROUS SULFATE 325 (65 FE) MG PO TABS: 325.0000 mg | ORAL_TABLET | Freq: Every day | ORAL | 3 refills | Status: AC

## 2016-11-05 MED ORDER — POLYETHYLENE GLYCOL 3350 17 G PO PACK: 17.0000 g | PACK | Freq: Every day | ORAL | 0 refills | Status: AC

## 2016-11-05 MED ORDER — ALUM & MAG HYDROXIDE-SIMETH 200-200-20 MG/5ML PO SUSP: 30.0000 mL | ORAL | 0 refills | Status: DC | PRN

## 2016-11-05 MED ORDER — APIXABAN 2.5 MG PO TABS: 2.5000 mg | ORAL_TABLET | Freq: Two times a day (BID) | ORAL | 0 refills | Status: DC

## 2016-11-05 MED ORDER — MEMANTINE HCL ER 28 MG PO CP24: 28.0000 mg | ORAL_CAPSULE | Freq: Every day | ORAL | 0 refills | Status: DC

## 2016-11-05 MED ORDER — ENSURE ENLIVE PO LIQD: 237.0000 mL | Freq: Two times a day (BID) | ORAL | 12 refills | Status: DC

## 2016-11-05 MED ORDER — SENNOSIDES-DOCUSATE SODIUM 8.6-50 MG PO TABS: 2.0000 | ORAL_TABLET | Freq: Two times a day (BID) | ORAL | 0 refills | Status: AC

## 2016-11-05 MED ORDER — ACETAMINOPHEN 325 MG PO TABS: 650.0000 mg | ORAL_TABLET | Freq: Four times a day (QID) | ORAL | Status: AC | PRN

## 2016-11-05 MED ORDER — DONEPEZIL HCL 10 MG PO TABS: 10.0000 mg | ORAL_TABLET | Freq: Every day | ORAL | 0 refills | Status: DC

## 2016-11-05 NOTE — Progress Notes (Signed)
Physical Therapy Treatment Patient Details Name: Vickie Hall MRN: [id number] DOB: Oct 25, 1927 Today's Date: 11/05/2016    History of Present Illness Vickie Hall is a 83 y.o. female, [facility] [address] [facility], ambulate short distances in the ALF without the help of a cane or a walker, dependent on activities of daily living, PMH of fairly advanced dementia, HTN, HLD, vitamin D deficiency, osteoporosis presented to the ED following a mechanical fall at the facility followed by left-sided hip and wrist pain. Now s/p IM Nail fixation L femur, 30% PWB; wrist fracture with splint to be managed non-operatively, may weight bear through elbow    PT Comments    Patient was able to transfer OOB this session with +2 max A. Pt was able to assist more than previous session and followed commands more consistently. Sister present. Continue to progress as tolerated with anticipated d/c to SNF for further skilled PT services.     Follow Up Recommendations  SNF     Equipment Recommendations  Other (comment) (TBD at SNF)    Recommendations for Other Services OT consult     Precautions / Restrictions Precautions Precautions: Fall Restrictions Weight Bearing Restrictions: Yes LUE Weight Bearing: Weight bear through elbow only LLE Weight Bearing: Partial weight bearing LLE Partial Weight Bearing Percentage or Pounds: 30    Mobility  Bed Mobility Overal bed mobility: Needs Assistance Bed Mobility: Supine to Sit     Supine to sit: +2 for physical assistance;Max assist;HOB elevated     General bed mobility comments: cues for sequencing and technique; assist to bring L LE to EOB, roll toward R side, and elevate trunk into sitting; use of bed pad  Transfers Overall transfer level: Needs assistance Equipment used:  (two person face to face with gait belt) Transfers: Sit to/from BJ'sStand;Stand Pivot Transfers Sit to Stand: Max assist;+2 physical assistance;From elevated surface Stand pivot  transfers: Max assist;+2 physical assistance       General transfer comment: assist to power up into standing and to maintain WB status L UE/LE; cues for technique  Ambulation/Gait                 Stairs            Wheelchair Mobility    Modified Rankin (Stroke Patients Only)       Balance Overall balance assessment: Needs assistance Sitting-balance support: Single extremity supported;Feet supported Sitting balance-Leahy Scale: Fair Sitting balance - Comments: posterior lean initially     Standing balance-Leahy Scale: Zero                      Cognition Arousal/Alertness: Awake/alert Behavior During Therapy: Anxious Overall Cognitive Status: History of cognitive impairments - at baseline                      Exercises General Exercises - Lower Extremity Ankle Circles/Pumps: AROM;Both;10 reps Quad Sets: AROM;Both;10 reps Heel Slides: AAROM;Left;10 reps    General Comments General comments (skin integrity, edema, etc.): sister present      Pertinent Vitals/Pain Pain Assessment: Faces Faces Pain Scale: Hurts even more Pain Location: L hip with any motion Pain Descriptors / Indicators: Grimacing;Guarding;Sore;Moaning Pain Intervention(s): Limited activity within patient's tolerance;Monitored during session;Premedicated before session;Repositioned;Ice applied    Home Living                      Prior Function  PT Goals (current goals can now be found in the care plan section) Acute Rehab PT Goals Patient Stated Goal: none stated PT Goal Formulation: Patient unable to participate in goal setting Time For Goal Achievement: 11/13/16 Potential to Achieve Goals: Good Progress towards PT goals: Progressing toward goals    Frequency    Min 3X/week      PT Plan Current plan remains appropriate    Co-evaluation             End of Session Equipment Utilized During Treatment: Gait belt Activity  Tolerance: Patient limited by pain Patient left: with call bell/phone within reach;in chair;with family/visitor present;with chair alarm set Nurse Communication: Mobility status       Time: 1610-9604 PT Time Calculation (min) (ACUTE ONLY): 26 min  Charges:  $Therapeutic Exercise: 8-22 mins $Therapeutic Activity: 8-22 mins                    G Codes:       Derek Mound, PTA Pager: [phone]   11/05/2016, 3:57 PM

## 2016-11-05 NOTE — Discharge Summary (Signed)
Vickie Hall, is a 83 y.o. female  DOB Nov 12, 1927  MRN [id number].  Admission date:  10/28/2016  Admitting Physician  Elease Etienne, MD  Discharge Date:  11/05/2016   Primary MD  Rudi Heap, MD  Recommendations for primary care physician for things to follow:  - Please check CBC, BMP in 3 days. - currently on 2 L nasal cannula, wean as tolerated, encourage use of incentive spirometry - Patient discharge with Foley catheter, patient will need voiding trial in 1 week from discharge 1 ambulation has improved. - Follow-up with hand surgery and orthopedics in 2 weeks.     Admission Diagnosis  Hip fx (HCC) [S72.009A] Contusion of face, initial encounter [S00.83XA] Fall, initial encounter [W19.XXXA] Closed intertrochanteric fracture of hip, left, initial encounter [facility]) [S72.142A] Traumatic closed fracture of ulnar styloid with minimal displacement, left, initial encounter [S52.612A] Closed fracture of distal end of left radius, unspecified fracture morphology, initial encounter [S52.502A]   Discharge Diagnosis  Hip fx (HCC) [S72.009A] Contusion of face, initial encounter [S00.83XA] Fall, initial encounter [W19.XXXA] Closed intertrochanteric fracture of hip, left, initial encounter [facility]) [S72.142A] Traumatic closed fracture of ulnar styloid with minimal displacement, left, initial encounter [S52.612A] Closed fracture of distal end of left radius, unspecified fracture morphology, initial encounter [S52.502A]    Principal Problem:   Closed left hip fracture (HCC) Active Problems:   GERD (gastroesophageal reflux disease)   HTN (hypertension)   Hyperlipidemia   Dementia due to Alzheimer's disease   Closed pertrochanteric fracture of femur, left, initial encounter [facility])      Past Medical History:  Diagnosis Date  . GERD (gastroesophageal reflux disease)   . Hyperlipidemia   . Hypertension     . Memory deficit   . Osteoporosis     Past Surgical History:  Procedure Laterality Date  . ABDOMINAL HYSTERECTOMY    . EYE SURGERY    . INTRAMEDULLARY (IM) NAIL INTERTROCHANTERIC Left 10/29/2016   Procedure: INTRAMEDULLARY (IM) NAIL INTERTROCHANTRIC;  Surgeon: Samson Frederic, MD;  Location: [facility] OR;  Service: Orthopedics;  Laterality: Left;  . ORIF WRIST FRACTURE Left 11/01/2016   Procedure: OPEN REDUCTION INTERNAL FIXATION (ORIF) WRIST FRACTURE;  Surgeon: Dominica Severin, MD;  Location: [facility] OR;  Service: Orthopedics;  Laterality: Left;       History of present illness and  Hospital Course:     Kindly see H&P for history of present illness and admission details, please review complete Labs, Consult reports and Test reports for all details in brief  HPI  from the history and physical done on the day of admission 10/28/2016  HPI: Vickie Hall is a 83 y.o. female, resident of [address] [facility], ambulate short distances in the ALF without the help of a cane or a walker, dependent on activities of daily living, PMH of fairly advanced dementia, HTN, HLD, vitamin D deficiency, osteoporosis presented to the ED following a mechanical fall at the facility followed by left-sided hip and wrist pain. Patient is unable to provide any history secondary to her dementia.  History was obtained from patient's son at bedside but he does not know much surrounding the event because he was not there. Not much details in the transfer note from [facility] ED. As per son's report, the facility called him at approximately 9:30 AM today to inform him that she had fallen at the facility while she was walking from her room to the breakfast room. No further details surrounding the event itself. They informed him that she had been transferred to [facility] ED. EDP then discussed her care with the orthopedic M.D. on call at [facility] who accepted patient in transfer and patient was directly sent to [facility] ED for further evaluation and management. Patient is pleasantly confused and denies any complaints. She denies left upper extremity pain. She does complain of left hip pain on movement. She denies headache, chest pain or dyspnea. No visual symptoms reported.  ED Course: Lab work done at outside hospital were unremarkable. Imaging study reports are as below. EDP consulted [facility] for admission. [facility] discussed with accepting orthopedic physician who indicates that she will be for surgery on 10/29/16.  Hospital Course  83 y.o.female, resident of [address] [facility], ambulate short distances in the ALF without the help of a cane or a walker, dependent on activities of daily living, PMH of fairly advanced dementia, HTN, HLD, vitamin D deficiency, osteoporosis presented to the ED following a mechanical fall at the facility followed by left-sided hip and wrist pain. She was initially taken to [facility] ED and then transferred to [facility] for evaluation and management by orthopedics. Went for surgical fixation of left hip fracture on 10/29/16 afternoon, and went for open reduction internal fixation of left wrist fracture on 2/15  Acute closed left hip fracture (communitedintertrochanteric fracture with mild displacement):  - Orthopedic consult greatly appreciated, went for surgical repair 2/12, continue with when necessary pain medication, followed also in 2 weeks will need DVT prophylaxis for the next 30 days(will be discharged on 21 days on Eliquis  DVT prophylaxis dose for total of 30 days treatment)  Left wrist fracture: -  hand Surgery consult greatly appreciated, went for open reduction internal fixation of left wrist fracture on 2/15, received IV antibiotics for 48 hours postoperatively, to follow with hand surgery in 2 weeks  Postoperative anemia:  - expected postoperative drop, been dropped to 7.9, transfuse 1 unit PRBC, with good response, hemoglobin is 9.1 at day of  discharge, monitor closely, CBC in 3 days, will be discharged in iron supplements.  Acute renal failure - Creatinine with significant increase to 2.07y evidence of urinary retention, Foley catheter inserted, Avapro was put on hold , gentle hydration, renal failure resolved with creatinine back to baseline .   Urinary retention - A with urinary retention 2/17, Foley catheter inserted, had 1150 cc and output after Foley catheter insertion, will need  a voiding trial in the outpatient setting when patient is more ambulatory.  Essential hypertension:  - Bilateral blood pressure during hospital stay, mainly soft to well controlled, so will discontinue antihypertensive medication on discharge  Hyperlipidemia: - Continue Zetia.  Advanced dementia:  - As per son, Alzheimer's dementia. No behavioral abnormalities. Continue home dose of Namzaric.  Osteoporosis and vitamin D deficiency: - Resume home medications on discharge  Sinus bradycardia:  - TSH normal. Repeat EKG on 10/28/16 shows sinus rhythm at 62 bpm  She is asymptomatic.  Leukocytosis:  - Possibly stress response. No symptoms or signs suggestive of  infection. Chest x-ray and urine microscopy unremarkable. resolved    Discharge Condition:  Stable   Follow UP  Follow-up Information    Swinteck, Cloyde Reams, MD. Schedule an appointment as soon as possible for a visit in 2 week(s).   Specialty:  Orthopedic Surgery Why:  For wound re-check Contact information: [address] [place] [address] [phone]        Karen Chafe, MD Follow up in 14 day(s).   Specialty:  Orthopedic Surgery Why:  see Dr Amanda Pea on same day as Dr Linna Caprice at [employer] in 2 weeks Contact information: [address] [place] [address] [phone]        Rudi Heap, MD Follow up in 1 week(s).   Specialty:  Family Medicine Contact information: [address]  [place] [address] [phone]             Discharge Instructions  and  Discharge Medications    Discharge Instructions    Discharge instructions    Complete by:  As directed    Follow with Primary MD Rudi Heap, MD in 7 days   Get CBC, CMP,  checked  by Primary MD next visit.    Activity:left wrist per ortho: Prefer platform walker with weight through left elbow and avoid weight bearing on her wrist, the patient will need to be nonweightbearing with the hand or wrist. However, she can bear weight for transfers and in a platform walker using the forearm and elbow. Therapy consist of finger range of motion, elevation and edema control, elbow flexion and extension as well as shoulder range of motion - TDWB LLE  Disposition ALF   Diet: Heart Healthy  , with feeding assistance and aspiration precautions.  For Heart failure patients - Check your Weight same time everyday, if you gain over 2 pounds, or you develop in leg swelling, experience more shortness of breath or chest pain, call your Primary MD immediately. Follow Cardiac Low Salt Diet and 1.5 lit/day fluid restriction.   On your next visit with your primary care physician please Get Medicines reviewed and adjusted.   Please request your Prim.MD to go over all Hospital Tests and Procedure/Radiological results at the follow up, please get all Hospital records sent to your Prim MD by signing hospital release before you go home.   If you experience worsening of your admission symptoms, develop shortness of breath, life threatening emergency, suicidal or homicidal thoughts you must seek medical attention immediately by calling 911 or calling your MD immediately  if symptoms less severe.  You Must read complete instructions/literature along with all the possible adverse reactions/side effects for all the Medicines you take and that have been prescribed to you. Take any new Medicines after you have completely understood and  accpet all the possible adverse reactions/side effects.   Do not drive, operating heavy machinery, perform activities at heights, swimming or participation in water activities or provide baby sitting services if your were admitted for syncope or siezures until you have seen by Primary MD or a Neurologist and advised to do so again.  Do not drive when taking Pain medications.    Do not take more than prescribed Pain, Sleep and Anxiety Medications  Special Instructions: If you have smoked or chewed Tobacco  in the last 2 yrs please stop smoking, stop any regular Alcohol  and or any Recreational drug use.  Wear Seat belts while driving.   Please note  You were cared  for by a hospitalist during your hospital stay. If you have any questions about your discharge medications or the care you received while you were in the hospital after you are discharged, you can call the unit and asked to speak with the hospitalist on call if the hospitalist that took care of you is not available. Once you are discharged, your primary care physician will handle any further medical issues. Please note that NO REFILLS for any discharge medications will be authorized once you are discharged, as it is imperative that you return to your primary care physician (or establish a relationship with a primary care physician if you do not have one) for your aftercare needs so that they can reassess your need for medications and monitor your lab values.     Allergies as of 11/05/2016      Reactions   Actonel [risedronate Sodium] Other (See Comments)   Lipitor [atorvastatin] Other (See Comments)      Medication List    STOP taking these medications   Memantine HCl-Donepezil HCl 28-10 MG Cp24 Commonly known as:  NAMZARIC   valsartan 160 MG tablet Commonly known as:  DIOVAN     TAKE these medications   acetaminophen 325 MG tablet Commonly known as:  TYLENOL Take 2 tablets (650 mg total) by mouth every 6 (six) hours as  needed for mild pain (or Fever >/= 101). What changed:  medication strength  how much to take  reasons to take this   alendronate 70 MG tablet Commonly known as:  FOSAMAX Take 70 mg by mouth once a week. Take with a full glass of water on an empty stomach.   alum & mag hydroxide-simeth 200-200-20 MG/5ML suspension Commonly known as:  MAALOX/MYLANTA Take 30 mLs by mouth as needed for indigestion or heartburn.   apixaban 2.5 MG Tabs tablet Commonly known as:  ELIQUIS Take 1 tablet (2.5 mg total) by mouth 2 (two) times daily. Please take for 21 days for DVT prophylaxis and then stop   donepezil 10 MG tablet Commonly known as:  ARICEPT Take 1 tablet (10 mg total) by mouth at bedtime.   ezetimibe 10 MG tablet Commonly known as:  ZETIA Take 10 mg by mouth daily.   feeding supplement (ENSURE ENLIVE) Liqd Take 237 mLs by mouth 2 (two) times daily between meals.   ferrous sulfate 325 (65 FE) MG tablet Take 1 tablet (325 mg total) by mouth daily with breakfast.   HYDROcodone-acetaminophen 5-325 MG tablet Commonly known as:  NORCO/VICODIN Take 1 tablet by mouth every 6 (six) hours as needed for moderate pain.   memantine 28 MG Cp24 24 hr capsule Commonly known as:  NAMENDA XR Take 1 capsule (28 mg total) by mouth at bedtime.   polyethylene glycol packet Commonly known as:  MIRALAX Take 17 g by mouth daily.   senna-docusate 8.6-50 MG tablet Commonly known as:  Senokot-S Take 2 tablets by mouth 2 (two) times daily.   Vitamin D (Ergocalciferol) 50000 units Caps capsule Commonly known as:  DRISDOL Take 1 capsule (50,000 Units total) by mouth every 7 (seven) days.         Diet and Activity recommendation: See Discharge Instructions above   Consults obtained -  Orthopedic In surgery   Major procedures and Radiology Reports - PLEASE review detailed and final reports for all details, in brief - -Intramedullary fixation of left femur by Dr. Linna Caprice on 10/29/2016 -open  reduction internal fixation of left wrist fracture on 2/15, - 1  unit PRBC transfusion    Dg Chest 1 View  Result Date: 10/28/2016 CLINICAL DATA:  Fall, hip fracture EXAM: CHEST 1 VIEW COMPARISON:  10/28/2016 at 1052 hours FINDINGS: Lungs are clear.  No pleural effusion or pneumothorax. The heart is normal in size. Thoracic aortic atherosclerosis. IMPRESSION: No evidence of acute cardiopulmonary disease. Thoracic aortic atherosclerosis. Electronically Signed   By: Charline Bills M.D.   On: 10/28/2016 17:38   Dg Wrist Complete Left  Result Date: 10/30/2016 CLINICAL DATA:  Recent radius fracture EXAM: LEFT WRIST - COMPLETE 3+ VIEW COMPARISON:  October 28, 2016 FINDINGS: Frontal, oblique, and lateral views were obtained with immobilization device in place. Comminuted fracture of the distal radial metaphysis is again noted with fracture fragment extending into the radiocarpal joint. There is slight volar angulation of distal major fracture fragments. No new fracture. No dislocation. There is extensive arthropathy in the first carpal-metacarpal joint. There is milder osteoarthritic change in the scaphotrapezial and proximal carpal row lesions. Osteoarthritic changes also noted in all visualized PIP and DIP joints. IMPRESSION: Comminuted fracture distal radial metaphysis with alignment essentially stable compared to 2 days prior. Note that fracture fragment extends into the radiocarpal joint with mild displacement of fracture fragments in this area, stable. No new fracture. No dislocation. Osteoarthritic change, most marked in the first carpal -metacarpal joint region. Electronically Signed   By: Bretta Bang III M.D.   On: 10/30/2016 21:48   Dg Wrist Complete Left  Result Date: 10/28/2016 CLINICAL DATA:  Status post fall, interval cast placement EXAM: LEFT WRIST - COMPLETE 3+ VIEW COMPARISON:  Same day FINDINGS: Comminuted distal radial metaphysis fracture with a fracture cleft extending to the  radiocarpal joint surface. No significant change in the overall alignment. Nondisplaced ulnar styloid process fracture. Severe osteoarthritis of the first [facility] joint. Mild osteoarthritis of the scaphotrapeziotrapezoid joint. IMPRESSION: Comminuted distal radial metaphysis fracture with a fracture cleft extending to the radiocarpal joint surface. No significant change in the overall alignment. Nondisplaced ulnar styloid process fracture. Electronically Signed   By: Elige Ko   On: 10/28/2016 17:42   Ct Head Wo Contrast  Result Date: 11/01/2016 CLINICAL DATA:  Status post facial trauma, with concern for head or maxillofacial injury. Initial encounter. EXAM: CT HEAD WITHOUT CONTRAST CT MAXILLOFACIAL WITHOUT CONTRAST TECHNIQUE: Multidetector CT imaging of the head and maxillofacial structures were performed using the standard protocol without intravenous contrast. Multiplanar CT image reconstructions of the maxillofacial structures were also generated. COMPARISON:  CT of the head and cervical spine performed 10/28/2016, and MRI of the brain performed 10/07/2007 FINDINGS: CT HEAD FINDINGS Brain: No evidence of acute infarction, hemorrhage, hydrocephalus, extra-axial collection or mass lesion/mass effect. Prominence of the ventricles and sulci reflects mild cortical volume loss. Mild cerebellar atrophy is noted. Scattered periventricular and subcortical white matter change likely reflects small vessel ischemic microangiopathy. The brainstem and fourth ventricle are within normal limits. The basal ganglia are unremarkable in appearance. The cerebral hemispheres demonstrate grossly normal gray-white differentiation. No mass effect or midline shift is seen. Vascular: No hyperdense vessel or unexpected calcification. Skull: There is no evidence of fracture; visualized osseous structures are unremarkable in appearance. Other: Mild soft tissue swelling is noted overlying the left frontal calvarium. CT MAXILLOFACIAL  FINDINGS Osseous: There is no evidence of fracture or dislocation. The maxilla and mandible appear intact. The nasal bone is unremarkable in appearance. The visualized dentition demonstrates no acute abnormality. A small degenerative calcification is noted at the left temporomandibular joint. Orbits: The orbits  are intact bilaterally. Sinuses: The visualized paranasal sinuses and mastoid air cells are well-aerated. Soft tissues: No significant soft tissue abnormalities are seen. The parapharyngeal fat planes are preserved. The nasopharynx, oropharynx and hypopharynx are unremarkable in appearance. The visualized portions of the valleculae and piriform sinuses are grossly unremarkable. The parotid and submandibular glands are within normal limits. No cervical lymphadenopathy is seen. IMPRESSION: 1. No evidence of traumatic intracranial injury or fracture. 2. No evidence of fracture or dislocation with regard to the maxillofacial structures. 3. Mild soft tissue swelling overlying the left frontal calvarium. 4. Mild cortical volume loss and scattered small vessel ischemic microangiopathy. 5. Small degenerative calcification at the left temporomandibular joint. Electronically Signed   By: Roanna RaiderJeffery  Chang M.D.   On: 11/01/2016 20:32   Pelvis Portable  Result Date: 10/29/2016 CLINICAL DATA:  Post internal fixation EXAM: PORTABLE PELVIS 1 VIEW COMPARISON:  the previous day's study FINDINGS: Interval placement of IM rod and sliding screw across a comminuted left intertrochanteric fracture, major fracture fragments in near anatomic alignment. There is persistent medial displacement of the inferior trochanter fracture fragment. No new fracture or dislocation. Distal aspect of IM rod not visualized. Stable changes of right hip osteoarthritis. IMPRESSION: 1. Internal fixation of left intertrochanteric femur fracture without apparent complication. Electronically Signed   By: Corlis Leak  Hassell M.D.   On: 10/29/2016 17:41   Dg Chest  Port 1 View  Result Date: 11/04/2016 CLINICAL DATA:  Hypoxia EXAM: PORTABLE CHEST 1 VIEW COMPARISON:  October 28, 2016 FINDINGS: There is no edema or consolidation. Heart size and pulmonary vascularity are normal. No adenopathy. There is aortic atherosclerosis. No bone lesions. IMPRESSION: Aortic atherosclerosis.  No edema or consolidation. Electronically Signed   By: Bretta BangWilliam  Woodruff III M.D.   On: 11/04/2016 08:36   Dg Abd Portable 1v  Result Date: 11/04/2016 CLINICAL DATA:  Abdominal pain EXAM: PORTABLE ABDOMEN - 1 VIEW COMPARISON:  10/28/2016 FINDINGS: Nonobstructive bowel gas pattern. Moderate stool volume. Atherosclerosis. Subtle left renal calculi, confirmed on 10/28/2016 abdominal CT. Hypertrophic transverse processes at the lumbosacral junction. IMPRESSION: No acute finding. Moderate stool volume. No evidence of obstruction or rectal impaction. Electronically Signed   By: Marnee SpringJonathon  Watts M.D.   On: 11/04/2016 12:40   Dg C-arm 1-60 Min  Result Date: 10/29/2016 CLINICAL DATA:  Placement of left femoral nail.  Initial encounter. EXAM: LEFT FEMUR 2 VIEWS COMPARISON:  Left hip radiographs performed 10/28/2016 FINDINGS: Four fluoroscopic C-arm images are provided from the OR, demonstrating placement of a left femoral intramedullary nail and screw, with fixation of left femoral intertrochanteric fracture in grossly anatomic alignment. No new fracture is seen. The left femoral head remains seated at the acetabulum. Scattered vascular calcifications are seen. IMPRESSION: Status post internal fixation of left femoral intertrochanteric fracture in grossly anatomic alignment. Electronically Signed   By: Roanna RaiderJeffery  Chang M.D.   On: 10/29/2016 17:04   Dg Hip Unilat W Or Wo Pelvis 2-3 Views Left  Result Date: 10/28/2016 CLINICAL DATA:  Status post fall.  Left hip pain EXAM: DG HIP (WITH OR WITHOUT PELVIS) 2-3V LEFT COMPARISON:  None. FINDINGS: Severe osteopenia. Comminuted left intertrochanteric fracture  with mild displacement. No other acute fracture or dislocation. No aggressive osseous lesion. Mild osteoarthritis of bilateral sacroiliac joints. IMPRESSION: Acute comminuted left intertrochanteric fracture. Electronically Signed   By: Elige KoHetal  Patel   On: 10/28/2016 17:37   Dg Femur Min 2 Views Left  Result Date: 10/29/2016 CLINICAL DATA:  Placement of left femoral nail.  Initial encounter.  EXAM: LEFT FEMUR 2 VIEWS COMPARISON:  Left hip radiographs performed 10/28/2016 FINDINGS: Four fluoroscopic C-arm images are provided from the OR, demonstrating placement of a left femoral intramedullary nail and screw, with fixation of left femoral intertrochanteric fracture in grossly anatomic alignment. No new fracture is seen. The left femoral head remains seated at the acetabulum. Scattered vascular calcifications are seen. IMPRESSION: Status post internal fixation of left femoral intertrochanteric fracture in grossly anatomic alignment. Electronically Signed   By: Roanna Raider M.D.   On: 10/29/2016 17:04   Dg Femur Port Min 2 Views Left  Result Date: 10/29/2016 CLINICAL DATA:  Left hip fracture. EXAM: LEFT FEMUR PORTABLE 2 VIEWS COMPARISON:  Radiographs dated 10/28/2016 and 10/29/2016 FINDINGS: The patient has undergone insertion of an intramedullary nail and lack screw across the comminuted proximal femur fracture. Distal fixation screw and present in the femoral shaft. The hardware appears in good position with near anatomic alignment and position of the fracture fragments. IMPRESSION: Satisfactory appearance of the left hip after open reduction and internal fixation. Electronically Signed   By: Francene Boyers M.D.   On: 10/29/2016 17:41   Ct Maxillofacial Wo Contrast  Result Date: 11/01/2016 CLINICAL DATA:  Status post facial trauma, with concern for head or maxillofacial injury. Initial encounter. EXAM: CT HEAD WITHOUT CONTRAST CT MAXILLOFACIAL WITHOUT CONTRAST TECHNIQUE: Multidetector CT imaging of the  head and maxillofacial structures were performed using the standard protocol without intravenous contrast. Multiplanar CT image reconstructions of the maxillofacial structures were also generated. COMPARISON:  CT of the head and cervical spine performed 10/28/2016, and MRI of the brain performed 10/07/2007 FINDINGS: CT HEAD FINDINGS Brain: No evidence of acute infarction, hemorrhage, hydrocephalus, extra-axial collection or mass lesion/mass effect. Prominence of the ventricles and sulci reflects mild cortical volume loss. Mild cerebellar atrophy is noted. Scattered periventricular and subcortical white matter change likely reflects small vessel ischemic microangiopathy. The brainstem and fourth ventricle are within normal limits. The basal ganglia are unremarkable in appearance. The cerebral hemispheres demonstrate grossly normal gray-white differentiation. No mass effect or midline shift is seen. Vascular: No hyperdense vessel or unexpected calcification. Skull: There is no evidence of fracture; visualized osseous structures are unremarkable in appearance. Other: Mild soft tissue swelling is noted overlying the left frontal calvarium. CT MAXILLOFACIAL FINDINGS Osseous: There is no evidence of fracture or dislocation. The maxilla and mandible appear intact. The nasal bone is unremarkable in appearance. The visualized dentition demonstrates no acute abnormality. A small degenerative calcification is noted at the left temporomandibular joint. Orbits: The orbits are intact bilaterally. Sinuses: The visualized paranasal sinuses and mastoid air cells are well-aerated. Soft tissues: No significant soft tissue abnormalities are seen. The parapharyngeal fat planes are preserved. The nasopharynx, oropharynx and hypopharynx are unremarkable in appearance. The visualized portions of the valleculae and piriform sinuses are grossly unremarkable. The parotid and submandibular glands are within normal limits. No cervical  lymphadenopathy is seen. IMPRESSION: 1. No evidence of traumatic intracranial injury or fracture. 2. No evidence of fracture or dislocation with regard to the maxillofacial structures. 3. Mild soft tissue swelling overlying the left frontal calvarium. 4. Mild cortical volume loss and scattered small vessel ischemic microangiopathy. 5. Small degenerative calcification at the left temporomandibular joint. Electronically Signed   By: Roanna Raider M.D.   On: 11/01/2016 20:32    Micro Results     Recent Results (from the past 240 hour(s))  MRSA PCR Screening     Status: None   Collection Time: 10/29/16  6:37  AM  Result Value Ref Range Status   MRSA by PCR NEGATIVE NEGATIVE Final    Comment:        The GeneXpert MRSA Assay (FDA approved for NASAL specimens only), is one component of a comprehensive MRSA colonization surveillance program. It is not intended to diagnose MRSA infection nor to guide or monitor treatment for MRSA infections.        Today   Subjective:   Vickie Hall today has no headache,no chest or abdominal pain, feels much better  Today after she had a good BM yesterday.  Objective:   Blood pressure (!) 158/69, pulse 61, temperature 98.2 F (36.8 C), temperature source Oral, resp. rate 16, SpO2 96 %.   Intake/Output Summary (Last 24 hours) at 11/05/16 1419 Last data filed at 11/05/16 0816  Gross per 24 hour  Intake              480 ml  Output              550 ml  Net              -70 ml    Exam  General exam: Pleasant elderly female lying comfortably supine in bed. Respiratory system: Clear to auscultation. Respiratory effort normal. Cardiovascular system: S1 & S2 heard, RRR. No JVD, murmurs, rubs, gallops or clicks. No pedal edema. Gastrointestinal system: Abdomen is nondistended, soft No distention. Normal bowel sounds heard. Central nervous system: Alert and oriented only to self. No focal neurological deficits. Extremities: Left forearm in  splint  with Good pulses, no cyanosis in bilateral lower extremities Skin: No rashes, lesions or ulcers Psychiatry: Judgement and insight impaired. Mood & affect appropriate.    Data Review   CBC w Diff:  Lab Results  Component Value Date   WBC 6.5 11/05/2016   HGB 9.1 (L) 11/05/2016   HCT 28.2 (L) 11/05/2016   HCT 41.4 09/28/2016   PLT 296 11/05/2016   PLT 246 09/28/2016   LYMPHOPCT 9 10/28/2016   MONOPCT 10 10/28/2016   EOSPCT 0 10/28/2016   BASOPCT 0 10/28/2016    CMP:  Lab Results  Component Value Date   NA 138 11/05/2016   NA 143 09/28/2016   K 4.0 11/05/2016   CL 101 11/05/2016   CO2 29 11/05/2016   BUN 12 11/05/2016   BUN 21 09/28/2016   CREATININE 0.62 11/05/2016   PROT 6.7 09/28/2016   ALBUMIN 4.2 09/28/2016   BILITOT 0.3 09/28/2016   ALKPHOS 63 09/28/2016   AST 16 09/28/2016   ALT 13 09/28/2016  .   Total Time in preparing paper work, data evaluation and todays exam - 35 minutes  Byrd Terrero M.D on 11/05/2016 at 2:19 PM  [facility]   Office  [phone]

## 2016-11-05 NOTE — Clinical Social Work Note (Signed)
RNCM contacted CSW to determine if ALF could accommodate pt d/c with foley. CSW spoke with Marchel[facility]manda [facility] and she will talk to her director and call back.   [address]Vickie Hall, [place] [phone]

## 2016-11-05 NOTE — Clinical Social Work Placement (Signed)
   CLINICAL SOCIAL WORK PLACEMENT  NOTE  Date:  11/05/2016  Patient Details  Name: Vickie SnowballHelen M Hall MRN: [id number] Date of Birth: November 03, 1927  Clinical Social Work is seeking post-discharge placement for this patient at the Assisted Living Facility level of care (*CSW will initial, date and re-position this form in  chart as items are completed):  Yes   Patient/family provided with [facility] Clinical Social Work Department's list of facilities offering this level of care within the geographic area requested by the patient (or if unable, by the patient's family).  Yes   Patient/family informed of their freedom to choose among providers that offer the needed level of care, that participate in Medicare, Medicaid or managed care program needed by the patient, have an available bed and are willing to accept the patient.      Patient/family informed of [facility]'s ownership interest in [facility] and [facility], as well as of the fact that they are under no obligation to receive care at these facilities.  PASRR submitted to EDS on       PASRR number received on 11/05/16     Existing PASRR number confirmed on       FL2 transmitted to all facilities in geographic area requested by pt/family on 11/05/16     FL2 transmitted to all facilities within larger geographic area on       Patient informed that his/her managed care company has contracts with or will negotiate with certain facilities, including the following:        Yes   Patient/family informed of bed offers received.  Patient chooses bed at  [facility] [place]     Physician recommends and patient chooses bed at      Patient to be transferred to  [facility] [place] on 11/05/16.  Patient to be transferred to facility by [facility]     Patient family notified on 11/05/16 of transfer.  Name of family member notified:  Harvie Heckandy     PHYSICIAN       Additional Comment:     _______________________________________________ Volney AmericanBridget A [place]ton, LCSW 11/05/2016, 3:57 PM

## 2016-11-05 NOTE — Progress Notes (Signed)
Report called to Kia at [facility] [place].

## 2016-11-05 NOTE — Clinical Social Work Note (Signed)
Clinical Social Worker facilitated patient discharge including contacting patient family and facility to confirm patient discharge plans.  Clinical information faxed to facility and family agreeable with plan.  CSW arranged ambulance transport via [facility] to [facility] [place].  RN to call [phone] for report prior to discharge.  Clinical Social Worker will sign off for now as social work intervention is no longer needed. Please consult us again if new need arises.  [address]Eban Weick [place], [place] [phone]

## 2016-11-05 NOTE — Progress Notes (Signed)
Nutrition Brief Note  Patient identified on the Malnutrition Screening Tool (MST) Report  Wt Readings from Last 15 Encounters:  09/28/16 147 lb (66.7 kg)  05/28/16 143 lb (64.9 kg)  11/14/15 143 lb (64.9 kg)  07/22/15 148 lb (67.1 kg)  05/06/15 148 lb (67.1 kg)  11/08/14 151 lb (68.5 kg)  07/07/14 155 lb (70.3 kg)  03/01/14 161 lb (73 kg)  12/16/13 163 lb 8 oz (74.2 kg)  11/11/13 164 lb (74.4 kg)   Current diet order is regular, patient is consuming approximately 55-100% of meals at this time. Pt reports having a good appetite currently and PTA with usual consumption of at least 3 meals a day. Labs and medications reviewed. Pt currently has Ensure ordered and has been consuming them. Will continue with current orders. Pt with no observed significant fat or muscle mass loss.   No further nutrition interventions warranted at this time. If nutrition issues arise, please consult RD. Plans to discharge today.   Roslyn SmilingStephanie Victory [place], MS, RD, LDN Pager # [phone] After hours/ weekend pager # [phone]

## 2016-11-05 NOTE — Care Management Note (Signed)
Case Management Note  Patient Details  Name: Lamont SnowballHelen M Keeran MRN: [id number] Date of Birth: October 24, 1927  Subjective/Objective:                    Action/Plan: Pt discharging back to her ALF today. [facility] to follow the foley catheter and to do a voiding trial in a week. CM [facility] to [facility] and they use [facility]. CM [facility] to [facility] with [facility] and she accepted the referral. CSW also following for return to ALF. No further needs per CM.   Expected Discharge Date:  11/05/16               Expected Discharge Plan:  Assisted Living / Rest Home  In-House Referral:  Clinical Social Work  Discharge planning Services  CM Consult  Post Acute Care Choice:  Home Health Choice offered to:   [facility]  DME Arranged:    DME Agency:     HH Arranged:  RN HH Agency:  [facility]  Status of Service:  Completed, signed off  If discussed at [employer] Meetings, dates discussed:    Additional Comments:  Kermit BaloKelli F Joshva Labreck, RN 11/05/2016, 3:36 PM

## 2016-11-07 DIAGNOSIS — Z7901 Long term (current) use of anticoagulants: Secondary | ICD-10-CM | POA: Diagnosis not present

## 2016-11-07 DIAGNOSIS — Z9981 Dependence on supplemental oxygen: Secondary | ICD-10-CM | POA: Diagnosis not present

## 2016-11-07 DIAGNOSIS — Z9181 History of falling: Secondary | ICD-10-CM | POA: Diagnosis not present

## 2016-11-07 DIAGNOSIS — Z466 Encounter for fitting and adjustment of urinary device: Secondary | ICD-10-CM | POA: Diagnosis not present

## 2016-11-07 DIAGNOSIS — I10 Essential (primary) hypertension: Secondary | ICD-10-CM | POA: Diagnosis not present

## 2016-11-07 DIAGNOSIS — S72002D Fracture of unspecified part of neck of left femur, subsequent encounter for closed fracture with routine healing: Secondary | ICD-10-CM | POA: Diagnosis not present

## 2016-11-07 DIAGNOSIS — S52502D Unspecified fracture of the lower end of left radius, subsequent encounter for closed fracture with routine healing: Secondary | ICD-10-CM | POA: Diagnosis not present

## 2016-11-07 DIAGNOSIS — S52612D Displaced fracture of left ulna styloid process, subsequent encounter for closed fracture with routine healing: Secondary | ICD-10-CM | POA: Diagnosis not present

## 2016-11-07 DIAGNOSIS — F0390 Unspecified dementia without behavioral disturbance: Secondary | ICD-10-CM | POA: Diagnosis not present

## 2016-11-08 DIAGNOSIS — Z7901 Long term (current) use of anticoagulants: Secondary | ICD-10-CM | POA: Diagnosis not present

## 2016-11-08 DIAGNOSIS — S52502D Unspecified fracture of the lower end of left radius, subsequent encounter for closed fracture with routine healing: Secondary | ICD-10-CM | POA: Diagnosis not present

## 2016-11-08 DIAGNOSIS — Z9181 History of falling: Secondary | ICD-10-CM | POA: Diagnosis not present

## 2016-11-08 DIAGNOSIS — S72002D Fracture of unspecified part of neck of left femur, subsequent encounter for closed fracture with routine healing: Secondary | ICD-10-CM | POA: Diagnosis not present

## 2016-11-08 DIAGNOSIS — S52612D Displaced fracture of left ulna styloid process, subsequent encounter for closed fracture with routine healing: Secondary | ICD-10-CM | POA: Diagnosis not present

## 2016-11-08 DIAGNOSIS — Z466 Encounter for fitting and adjustment of urinary device: Secondary | ICD-10-CM | POA: Diagnosis not present

## 2016-11-08 DIAGNOSIS — Z9981 Dependence on supplemental oxygen: Secondary | ICD-10-CM | POA: Diagnosis not present

## 2016-11-08 DIAGNOSIS — F0390 Unspecified dementia without behavioral disturbance: Secondary | ICD-10-CM | POA: Diagnosis not present

## 2016-11-08 DIAGNOSIS — S72143A Displaced intertrochanteric fracture of unspecified femur, initial encounter for closed fracture: Secondary | ICD-10-CM | POA: Diagnosis not present

## 2016-11-08 DIAGNOSIS — I10 Essential (primary) hypertension: Secondary | ICD-10-CM | POA: Diagnosis not present

## 2016-11-09 ENCOUNTER — Other Ambulatory Visit: Payer: Medicare HMO

## 2016-11-09 DIAGNOSIS — S52612D Displaced fracture of left ulna styloid process, subsequent encounter for closed fracture with routine healing: Secondary | ICD-10-CM | POA: Diagnosis not present

## 2016-11-09 DIAGNOSIS — S72002D Fracture of unspecified part of neck of left femur, subsequent encounter for closed fracture with routine healing: Secondary | ICD-10-CM | POA: Diagnosis not present

## 2016-11-09 DIAGNOSIS — Z9181 History of falling: Secondary | ICD-10-CM | POA: Diagnosis not present

## 2016-11-09 DIAGNOSIS — I10 Essential (primary) hypertension: Secondary | ICD-10-CM | POA: Diagnosis not present

## 2016-11-09 DIAGNOSIS — S52502D Unspecified fracture of the lower end of left radius, subsequent encounter for closed fracture with routine healing: Secondary | ICD-10-CM | POA: Diagnosis not present

## 2016-11-09 DIAGNOSIS — Z7901 Long term (current) use of anticoagulants: Secondary | ICD-10-CM | POA: Diagnosis not present

## 2016-11-09 DIAGNOSIS — F0390 Unspecified dementia without behavioral disturbance: Secondary | ICD-10-CM | POA: Diagnosis not present

## 2016-11-09 DIAGNOSIS — M6281 Muscle weakness (generalized): Secondary | ICD-10-CM | POA: Diagnosis not present

## 2016-11-09 DIAGNOSIS — Z466 Encounter for fitting and adjustment of urinary device: Secondary | ICD-10-CM | POA: Diagnosis not present

## 2016-11-09 DIAGNOSIS — Z9981 Dependence on supplemental oxygen: Secondary | ICD-10-CM | POA: Diagnosis not present

## 2016-11-10 DIAGNOSIS — Z9181 History of falling: Secondary | ICD-10-CM | POA: Diagnosis not present

## 2016-11-10 DIAGNOSIS — Z466 Encounter for fitting and adjustment of urinary device: Secondary | ICD-10-CM | POA: Diagnosis not present

## 2016-11-10 DIAGNOSIS — F0390 Unspecified dementia without behavioral disturbance: Secondary | ICD-10-CM | POA: Diagnosis not present

## 2016-11-10 DIAGNOSIS — I10 Essential (primary) hypertension: Secondary | ICD-10-CM | POA: Diagnosis not present

## 2016-11-10 DIAGNOSIS — S72002D Fracture of unspecified part of neck of left femur, subsequent encounter for closed fracture with routine healing: Secondary | ICD-10-CM | POA: Diagnosis not present

## 2016-11-10 DIAGNOSIS — S52502D Unspecified fracture of the lower end of left radius, subsequent encounter for closed fracture with routine healing: Secondary | ICD-10-CM | POA: Diagnosis not present

## 2016-11-10 DIAGNOSIS — S52612D Displaced fracture of left ulna styloid process, subsequent encounter for closed fracture with routine healing: Secondary | ICD-10-CM | POA: Diagnosis not present

## 2016-11-10 DIAGNOSIS — Z9981 Dependence on supplemental oxygen: Secondary | ICD-10-CM | POA: Diagnosis not present

## 2016-11-10 DIAGNOSIS — Z7901 Long term (current) use of anticoagulants: Secondary | ICD-10-CM | POA: Diagnosis not present

## 2016-11-12 ENCOUNTER — Ambulatory Visit (INDEPENDENT_AMBULATORY_CARE_PROVIDER_SITE_OTHER): Payer: Medicare HMO | Admitting: Family Medicine

## 2016-11-12 ENCOUNTER — Encounter: Payer: Self-pay | Admitting: Family Medicine

## 2016-11-12 VITALS — BP 116/61 | HR 46 | Temp 97.1°F | Ht 62.0 in | Wt 147.0 lb

## 2016-11-12 DIAGNOSIS — D649 Anemia, unspecified: Secondary | ICD-10-CM

## 2016-11-12 DIAGNOSIS — S72002D Fracture of unspecified part of neck of left femur, subsequent encounter for closed fracture with routine healing: Secondary | ICD-10-CM | POA: Diagnosis not present

## 2016-11-12 DIAGNOSIS — S72102A Unspecified trochanteric fracture of left femur, initial encounter for closed fracture: Secondary | ICD-10-CM

## 2016-11-12 DIAGNOSIS — G309 Alzheimer's disease, unspecified: Secondary | ICD-10-CM | POA: Diagnosis not present

## 2016-11-12 DIAGNOSIS — S52502D Unspecified fracture of the lower end of left radius, subsequent encounter for closed fracture with routine healing: Secondary | ICD-10-CM | POA: Diagnosis not present

## 2016-11-12 DIAGNOSIS — R001 Bradycardia, unspecified: Secondary | ICD-10-CM

## 2016-11-12 DIAGNOSIS — S52612D Displaced fracture of left ulna styloid process, subsequent encounter for closed fracture with routine healing: Secondary | ICD-10-CM | POA: Diagnosis not present

## 2016-11-12 DIAGNOSIS — Z9289 Personal history of other medical treatment: Secondary | ICD-10-CM

## 2016-11-12 DIAGNOSIS — F0280 Dementia in other diseases classified elsewhere without behavioral disturbance: Secondary | ICD-10-CM | POA: Diagnosis not present

## 2016-11-12 DIAGNOSIS — I4891 Unspecified atrial fibrillation: Secondary | ICD-10-CM | POA: Diagnosis not present

## 2016-11-12 DIAGNOSIS — F0390 Unspecified dementia without behavioral disturbance: Secondary | ICD-10-CM | POA: Diagnosis not present

## 2016-11-12 DIAGNOSIS — Z466 Encounter for fitting and adjustment of urinary device: Secondary | ICD-10-CM | POA: Diagnosis not present

## 2016-11-12 DIAGNOSIS — Z9981 Dependence on supplemental oxygen: Secondary | ICD-10-CM | POA: Diagnosis not present

## 2016-11-12 DIAGNOSIS — I10 Essential (primary) hypertension: Secondary | ICD-10-CM | POA: Diagnosis not present

## 2016-11-12 DIAGNOSIS — Z9181 History of falling: Secondary | ICD-10-CM | POA: Diagnosis not present

## 2016-11-12 DIAGNOSIS — Z978 Presence of other specified devices: Secondary | ICD-10-CM

## 2016-11-12 DIAGNOSIS — Z960 Presence of urogenital implants: Secondary | ICD-10-CM

## 2016-11-12 DIAGNOSIS — Z7901 Long term (current) use of anticoagulants: Secondary | ICD-10-CM | POA: Diagnosis not present

## 2016-11-12 NOTE — Progress Notes (Signed)
   HPI  Patient presents today here for face-to-face visit from assisted living for DME equipment and hospital follow-up.  Patient was hospitalized after a fall on 10/28/2016 causing a left hip fracture and left distal radius fracture. She had surgery to correct both fractures. She's been placed on anticoagulation for DVT prophylaxis.  She comes in with request for several DME items from physical therapy, orders were written for all of these. She's having severe difficulty standing given pain with standing, she started therapy 3 days ago and is doing pretty well.  PMH: Smoking status noted ROS: Per HPI  Objective: BP 116/61   Pulse (!) 46   Temp 97.1 F (36.2 C) (Oral)   Ht '5\' 2"'$  (1.575 m)   Wt 147 lb (66.7 kg)   BMI 26.89 kg/m  Gen: NAD, alert, cooperative with exam HEENT: NCAT, moist oral mucosa CV: brady, irregular Resp: CTABL, no wheezes, non-labored Ext: L > R edema, 1-2+ on L trace to 1+ on R Neuro: Alert and oriented, No gross deficits  Assessment and plan:  # Closed left femur fracture, Alzheimer's disease Patient with recent fracture in the left hip as well as left radius, Alzheimer's disease, and much difficulty with walking and standing currently. Several DME orders written as detailed below: For hospital bed, platform rolling walker, 3 in one commode, and wheelchair. Also for lift chair.  # HTN Controlled, No changes. Continue ARB  # Normocytic anemia Status post transfusion the hospital perioperatively CBC today  # Bradycardia, atrial fibrillation Initially measured at 46, EKG shows A. Fib. Rate 76 Refer to cardiology Continue anticoagulation instead of stopping after 3 weeks for DVT prophylaxis. Patient is a fall risk, however she does have good monitoring at assisted living. Rate controlled, could even have tachycardia/bradycardia-sick sinus syndrome, no BB for now   Foley in place RN voiding trial at [facility] ordered   Orders Placed This  Encounter  Procedures  . For home use only DME wheelchair cushion (seat and back)  . For home use only DME Bedside commode    Order Specific Question:   Patient needs a bedside commode to treat with the following condition    Answer:   Hip fracture ([place]) [163845]  . For home use only DME Walker platform    Order Specific Question:   Patient needs a walker to treat with the following condition    Answer:   Hip fracture ([place]) [364680]  . For home use only DME Hospital bed    Order Specific Question:   Bed type    Answer:   Semi-electric  . CBC with Differential/Platelet  . CMP14+EGFR  . Ambulatory referral to Cardiology    Referral Priority:   Routine    Referral Type:   Consultation    Referral Reason:   Specialty Services Required    Requested Specialty:   Cardiology    Number of Visits Requested:   1  . EKG 12-Lead    Meds ordered this encounter  Medications  . valsartan (DIOVAN) 160 MG tablet    Laroy Apple, MD [facility] 11/12/2016, 3:14 PM

## 2016-11-12 NOTE — Patient Instructions (Signed)
Great to see you!  Lets have you back in 1 month to See Dr. Christell ConstantMoore for follow up

## 2016-11-13 DIAGNOSIS — Z466 Encounter for fitting and adjustment of urinary device: Secondary | ICD-10-CM | POA: Diagnosis not present

## 2016-11-13 DIAGNOSIS — S52502D Unspecified fracture of the lower end of left radius, subsequent encounter for closed fracture with routine healing: Secondary | ICD-10-CM | POA: Diagnosis not present

## 2016-11-13 DIAGNOSIS — F0390 Unspecified dementia without behavioral disturbance: Secondary | ICD-10-CM | POA: Diagnosis not present

## 2016-11-13 DIAGNOSIS — S72002D Fracture of unspecified part of neck of left femur, subsequent encounter for closed fracture with routine healing: Secondary | ICD-10-CM | POA: Diagnosis not present

## 2016-11-13 DIAGNOSIS — I10 Essential (primary) hypertension: Secondary | ICD-10-CM | POA: Diagnosis not present

## 2016-11-13 DIAGNOSIS — S52612D Displaced fracture of left ulna styloid process, subsequent encounter for closed fracture with routine healing: Secondary | ICD-10-CM | POA: Diagnosis not present

## 2016-11-13 DIAGNOSIS — Z9181 History of falling: Secondary | ICD-10-CM | POA: Diagnosis not present

## 2016-11-13 DIAGNOSIS — Z7901 Long term (current) use of anticoagulants: Secondary | ICD-10-CM | POA: Diagnosis not present

## 2016-11-13 DIAGNOSIS — Z9981 Dependence on supplemental oxygen: Secondary | ICD-10-CM | POA: Diagnosis not present

## 2016-11-13 LAB — CMP14+EGFR
A/G RATIO: 1.3 (ref 1.2–2.2)
ALT: 17 IU/L (ref 0–32)
AST: 25 IU/L (ref 0–40)
Albumin: 3.5 g/dL (ref 3.5–4.7)
Alkaline Phosphatase: 99 IU/L (ref 39–117)
BILIRUBIN TOTAL: 0.6 mg/dL (ref 0.0–1.2)
BUN / CREAT RATIO: 34 — AB (ref 12–28)
BUN: 20 mg/dL (ref 8–27)
CALCIUM: 8.8 mg/dL (ref 8.7–10.3)
CHLORIDE: 99 mmol/L (ref 96–106)
CO2: 23 mmol/L (ref 18–29)
Creatinine, Ser: 0.58 mg/dL (ref 0.57–1.00)
GFR, EST AFRICAN AMERICAN: 97 mL/min/{1.73_m2} (ref >59)
GFR, EST NON AFRICAN AMERICAN: 84 mL/min/{1.73_m2} (ref >59)
GLOBULIN, TOTAL: 2.7 g/dL (ref 1.5–4.5)
Glucose: 147 mg/dL — ABNORMAL HIGH (ref 65–99)
POTASSIUM: 4.2 mmol/L (ref 3.5–5.2)
SODIUM: 140 mmol/L (ref 134–144)
TOTAL PROTEIN: 6.2 g/dL (ref 6.0–8.5)

## 2016-11-13 LAB — CBC WITH DIFFERENTIAL/PLATELET
BASOS: 0 %
Basophils Absolute: 0 10*3/uL (ref 0.0–0.2)
EOS (ABSOLUTE): 0.2 10*3/uL (ref 0.0–0.4)
EOS: 2 %
HEMATOCRIT: 34.6 % (ref 34.0–46.6)
Hemoglobin: 10.7 g/dL — ABNORMAL LOW (ref 11.1–15.9)
IMMATURE GRANS (ABS): 0 10*3/uL (ref 0.0–0.1)
IMMATURE GRANULOCYTES: 0 %
Lymphocytes Absolute: 1.5 10*3/uL (ref 0.7–3.1)
Lymphs: 14 %
MCH: 30.4 pg (ref 26.6–33.0)
MCHC: 30.9 g/dL — ABNORMAL LOW (ref 31.5–35.7)
MCV: 98 fL — AB (ref 79–97)
MONOS ABS: 0.7 10*3/uL (ref 0.1–0.9)
Monocytes: 7 %
NEUTROS ABS: 8.5 10*3/uL — AB (ref 1.4–7.0)
NEUTROS PCT: 77 %
PLATELETS: 632 10*3/uL — AB (ref 150–379)
RBC: 3.52 x10E6/uL — ABNORMAL LOW (ref 3.77–5.28)
RDW: 15.1 % (ref 12.3–15.4)
WBC: 11 10*3/uL — ABNORMAL HIGH (ref 3.4–10.8)

## 2016-11-14 ENCOUNTER — Other Ambulatory Visit: Payer: Medicare HMO

## 2016-11-14 DIAGNOSIS — Z466 Encounter for fitting and adjustment of urinary device: Secondary | ICD-10-CM | POA: Diagnosis not present

## 2016-11-14 DIAGNOSIS — I10 Essential (primary) hypertension: Secondary | ICD-10-CM | POA: Diagnosis not present

## 2016-11-14 DIAGNOSIS — R319 Hematuria, unspecified: Secondary | ICD-10-CM | POA: Diagnosis not present

## 2016-11-14 DIAGNOSIS — Z7901 Long term (current) use of anticoagulants: Secondary | ICD-10-CM | POA: Diagnosis not present

## 2016-11-14 DIAGNOSIS — S52612D Displaced fracture of left ulna styloid process, subsequent encounter for closed fracture with routine healing: Secondary | ICD-10-CM | POA: Diagnosis not present

## 2016-11-14 DIAGNOSIS — S72142D Displaced intertrochanteric fracture of left femur, subsequent encounter for closed fracture with routine healing: Secondary | ICD-10-CM | POA: Diagnosis not present

## 2016-11-14 DIAGNOSIS — S52502D Unspecified fracture of the lower end of left radius, subsequent encounter for closed fracture with routine healing: Secondary | ICD-10-CM | POA: Diagnosis not present

## 2016-11-14 DIAGNOSIS — Z9981 Dependence on supplemental oxygen: Secondary | ICD-10-CM | POA: Diagnosis not present

## 2016-11-14 DIAGNOSIS — S72002D Fracture of unspecified part of neck of left femur, subsequent encounter for closed fracture with routine healing: Secondary | ICD-10-CM | POA: Diagnosis not present

## 2016-11-14 DIAGNOSIS — F0390 Unspecified dementia without behavioral disturbance: Secondary | ICD-10-CM | POA: Diagnosis not present

## 2016-11-14 DIAGNOSIS — Z9181 History of falling: Secondary | ICD-10-CM | POA: Diagnosis not present

## 2016-11-15 DIAGNOSIS — Z9981 Dependence on supplemental oxygen: Secondary | ICD-10-CM | POA: Diagnosis not present

## 2016-11-15 DIAGNOSIS — Z9181 History of falling: Secondary | ICD-10-CM | POA: Diagnosis not present

## 2016-11-15 DIAGNOSIS — S52612D Displaced fracture of left ulna styloid process, subsequent encounter for closed fracture with routine healing: Secondary | ICD-10-CM | POA: Diagnosis not present

## 2016-11-15 DIAGNOSIS — Z466 Encounter for fitting and adjustment of urinary device: Secondary | ICD-10-CM | POA: Diagnosis not present

## 2016-11-15 DIAGNOSIS — Z7901 Long term (current) use of anticoagulants: Secondary | ICD-10-CM | POA: Diagnosis not present

## 2016-11-15 DIAGNOSIS — S72002D Fracture of unspecified part of neck of left femur, subsequent encounter for closed fracture with routine healing: Secondary | ICD-10-CM | POA: Diagnosis not present

## 2016-11-15 DIAGNOSIS — F0390 Unspecified dementia without behavioral disturbance: Secondary | ICD-10-CM | POA: Diagnosis not present

## 2016-11-15 DIAGNOSIS — S52502D Unspecified fracture of the lower end of left radius, subsequent encounter for closed fracture with routine healing: Secondary | ICD-10-CM | POA: Diagnosis not present

## 2016-11-15 DIAGNOSIS — I10 Essential (primary) hypertension: Secondary | ICD-10-CM | POA: Diagnosis not present

## 2016-11-16 DIAGNOSIS — Z9981 Dependence on supplemental oxygen: Secondary | ICD-10-CM | POA: Diagnosis not present

## 2016-11-16 DIAGNOSIS — S72002D Fracture of unspecified part of neck of left femur, subsequent encounter for closed fracture with routine healing: Secondary | ICD-10-CM | POA: Diagnosis not present

## 2016-11-16 DIAGNOSIS — S52612D Displaced fracture of left ulna styloid process, subsequent encounter for closed fracture with routine healing: Secondary | ICD-10-CM | POA: Diagnosis not present

## 2016-11-16 DIAGNOSIS — Z9181 History of falling: Secondary | ICD-10-CM | POA: Diagnosis not present

## 2016-11-16 DIAGNOSIS — S52502D Unspecified fracture of the lower end of left radius, subsequent encounter for closed fracture with routine healing: Secondary | ICD-10-CM | POA: Diagnosis not present

## 2016-11-16 DIAGNOSIS — I10 Essential (primary) hypertension: Secondary | ICD-10-CM | POA: Diagnosis not present

## 2016-11-16 DIAGNOSIS — F0390 Unspecified dementia without behavioral disturbance: Secondary | ICD-10-CM | POA: Diagnosis not present

## 2016-11-16 DIAGNOSIS — Z466 Encounter for fitting and adjustment of urinary device: Secondary | ICD-10-CM | POA: Diagnosis not present

## 2016-11-16 DIAGNOSIS — Z7901 Long term (current) use of anticoagulants: Secondary | ICD-10-CM | POA: Diagnosis not present

## 2016-11-19 DIAGNOSIS — Z7901 Long term (current) use of anticoagulants: Secondary | ICD-10-CM | POA: Diagnosis not present

## 2016-11-19 DIAGNOSIS — F0390 Unspecified dementia without behavioral disturbance: Secondary | ICD-10-CM | POA: Diagnosis not present

## 2016-11-19 DIAGNOSIS — S72002D Fracture of unspecified part of neck of left femur, subsequent encounter for closed fracture with routine healing: Secondary | ICD-10-CM | POA: Diagnosis not present

## 2016-11-19 DIAGNOSIS — S52612D Displaced fracture of left ulna styloid process, subsequent encounter for closed fracture with routine healing: Secondary | ICD-10-CM | POA: Diagnosis not present

## 2016-11-19 DIAGNOSIS — S52502D Unspecified fracture of the lower end of left radius, subsequent encounter for closed fracture with routine healing: Secondary | ICD-10-CM | POA: Diagnosis not present

## 2016-11-19 DIAGNOSIS — Z9981 Dependence on supplemental oxygen: Secondary | ICD-10-CM | POA: Diagnosis not present

## 2016-11-19 DIAGNOSIS — I10 Essential (primary) hypertension: Secondary | ICD-10-CM | POA: Diagnosis not present

## 2016-11-19 DIAGNOSIS — Z9181 History of falling: Secondary | ICD-10-CM | POA: Diagnosis not present

## 2016-11-19 DIAGNOSIS — Z466 Encounter for fitting and adjustment of urinary device: Secondary | ICD-10-CM | POA: Diagnosis not present

## 2016-11-20 DIAGNOSIS — I10 Essential (primary) hypertension: Secondary | ICD-10-CM | POA: Diagnosis not present

## 2016-11-20 DIAGNOSIS — S52502D Unspecified fracture of the lower end of left radius, subsequent encounter for closed fracture with routine healing: Secondary | ICD-10-CM | POA: Diagnosis not present

## 2016-11-20 DIAGNOSIS — Z9181 History of falling: Secondary | ICD-10-CM | POA: Diagnosis not present

## 2016-11-20 DIAGNOSIS — Z9981 Dependence on supplemental oxygen: Secondary | ICD-10-CM | POA: Diagnosis not present

## 2016-11-20 DIAGNOSIS — Z466 Encounter for fitting and adjustment of urinary device: Secondary | ICD-10-CM | POA: Diagnosis not present

## 2016-11-20 DIAGNOSIS — S72002D Fracture of unspecified part of neck of left femur, subsequent encounter for closed fracture with routine healing: Secondary | ICD-10-CM | POA: Diagnosis not present

## 2016-11-20 DIAGNOSIS — S52612D Displaced fracture of left ulna styloid process, subsequent encounter for closed fracture with routine healing: Secondary | ICD-10-CM | POA: Diagnosis not present

## 2016-11-20 DIAGNOSIS — F0390 Unspecified dementia without behavioral disturbance: Secondary | ICD-10-CM | POA: Diagnosis not present

## 2016-11-20 DIAGNOSIS — Z7901 Long term (current) use of anticoagulants: Secondary | ICD-10-CM | POA: Diagnosis not present

## 2016-11-21 DIAGNOSIS — F0390 Unspecified dementia without behavioral disturbance: Secondary | ICD-10-CM | POA: Diagnosis not present

## 2016-11-21 DIAGNOSIS — S52502D Unspecified fracture of the lower end of left radius, subsequent encounter for closed fracture with routine healing: Secondary | ICD-10-CM | POA: Diagnosis not present

## 2016-11-21 DIAGNOSIS — Z9981 Dependence on supplemental oxygen: Secondary | ICD-10-CM | POA: Diagnosis not present

## 2016-11-21 DIAGNOSIS — I10 Essential (primary) hypertension: Secondary | ICD-10-CM | POA: Diagnosis not present

## 2016-11-21 DIAGNOSIS — S72002D Fracture of unspecified part of neck of left femur, subsequent encounter for closed fracture with routine healing: Secondary | ICD-10-CM | POA: Diagnosis not present

## 2016-11-21 DIAGNOSIS — Z466 Encounter for fitting and adjustment of urinary device: Secondary | ICD-10-CM | POA: Diagnosis not present

## 2016-11-21 DIAGNOSIS — Z9181 History of falling: Secondary | ICD-10-CM | POA: Diagnosis not present

## 2016-11-21 DIAGNOSIS — Z7901 Long term (current) use of anticoagulants: Secondary | ICD-10-CM | POA: Diagnosis not present

## 2016-11-21 DIAGNOSIS — S52612D Displaced fracture of left ulna styloid process, subsequent encounter for closed fracture with routine healing: Secondary | ICD-10-CM | POA: Diagnosis not present

## 2016-11-22 ENCOUNTER — Telehealth: Payer: Self-pay | Admitting: *Deleted

## 2016-11-22 DIAGNOSIS — F0390 Unspecified dementia without behavioral disturbance: Secondary | ICD-10-CM | POA: Diagnosis not present

## 2016-11-22 DIAGNOSIS — Z978 Presence of other specified devices: Secondary | ICD-10-CM

## 2016-11-22 DIAGNOSIS — Z9181 History of falling: Secondary | ICD-10-CM | POA: Diagnosis not present

## 2016-11-22 DIAGNOSIS — I10 Essential (primary) hypertension: Secondary | ICD-10-CM | POA: Diagnosis not present

## 2016-11-22 DIAGNOSIS — Z960 Presence of urogenital implants: Principal | ICD-10-CM

## 2016-11-22 DIAGNOSIS — S52502D Unspecified fracture of the lower end of left radius, subsequent encounter for closed fracture with routine healing: Secondary | ICD-10-CM | POA: Diagnosis not present

## 2016-11-22 DIAGNOSIS — S72002D Fracture of unspecified part of neck of left femur, subsequent encounter for closed fracture with routine healing: Secondary | ICD-10-CM | POA: Diagnosis not present

## 2016-11-22 DIAGNOSIS — Z9981 Dependence on supplemental oxygen: Secondary | ICD-10-CM | POA: Diagnosis not present

## 2016-11-22 DIAGNOSIS — Z7901 Long term (current) use of anticoagulants: Secondary | ICD-10-CM | POA: Diagnosis not present

## 2016-11-22 DIAGNOSIS — Z466 Encounter for fitting and adjustment of urinary device: Secondary | ICD-10-CM | POA: Diagnosis not present

## 2016-11-22 DIAGNOSIS — S52612D Displaced fracture of left ulna styloid process, subsequent encounter for closed fracture with routine healing: Secondary | ICD-10-CM | POA: Diagnosis not present

## 2016-11-22 MED ORDER — CEPHALEXIN 500 MG PO CAPS: 500.0000 mg | ORAL_CAPSULE | Freq: Three times a day (TID) | ORAL | 0 refills | Status: DC

## 2016-11-22 NOTE — Telephone Encounter (Signed)
Called Lanora ManisElizabeth w/ [facility] and [place] at [facility] to inform of Rx and referral for pt

## 2016-11-22 NOTE — Telephone Encounter (Signed)
Removed foley cath yesterday at 2pm for voiding trial. Had to recath at 10 pm, pt had not voided. 175 cc total at 10 pm. Left foley in until further notice. Please call and advise

## 2016-11-22 NOTE — Telephone Encounter (Signed)
U A results received as well- treat UTO with keflex  Refer to urology for failed voiding trial  Murtis SinkSam Prerana Strayer, MD [facility] 11/22/2016, 12:57 PM

## 2016-11-23 DIAGNOSIS — S62102D Fracture of unspecified carpal bone, left wrist, subsequent encounter for fracture with routine healing: Secondary | ICD-10-CM | POA: Diagnosis not present

## 2016-11-25 DIAGNOSIS — Z9181 History of falling: Secondary | ICD-10-CM | POA: Diagnosis not present

## 2016-11-25 DIAGNOSIS — Z7901 Long term (current) use of anticoagulants: Secondary | ICD-10-CM | POA: Diagnosis not present

## 2016-11-25 DIAGNOSIS — S52502D Unspecified fracture of the lower end of left radius, subsequent encounter for closed fracture with routine healing: Secondary | ICD-10-CM | POA: Diagnosis not present

## 2016-11-25 DIAGNOSIS — Z466 Encounter for fitting and adjustment of urinary device: Secondary | ICD-10-CM | POA: Diagnosis not present

## 2016-11-25 DIAGNOSIS — F0390 Unspecified dementia without behavioral disturbance: Secondary | ICD-10-CM | POA: Diagnosis not present

## 2016-11-25 DIAGNOSIS — S72002D Fracture of unspecified part of neck of left femur, subsequent encounter for closed fracture with routine healing: Secondary | ICD-10-CM | POA: Diagnosis not present

## 2016-11-25 DIAGNOSIS — Z9981 Dependence on supplemental oxygen: Secondary | ICD-10-CM | POA: Diagnosis not present

## 2016-11-25 DIAGNOSIS — I10 Essential (primary) hypertension: Secondary | ICD-10-CM | POA: Diagnosis not present

## 2016-11-25 DIAGNOSIS — S52612D Displaced fracture of left ulna styloid process, subsequent encounter for closed fracture with routine healing: Secondary | ICD-10-CM | POA: Diagnosis not present

## 2016-11-27 DIAGNOSIS — Z7901 Long term (current) use of anticoagulants: Secondary | ICD-10-CM | POA: Diagnosis not present

## 2016-11-27 DIAGNOSIS — S52502D Unspecified fracture of the lower end of left radius, subsequent encounter for closed fracture with routine healing: Secondary | ICD-10-CM | POA: Diagnosis not present

## 2016-11-27 DIAGNOSIS — F0390 Unspecified dementia without behavioral disturbance: Secondary | ICD-10-CM | POA: Diagnosis not present

## 2016-11-27 DIAGNOSIS — S52612D Displaced fracture of left ulna styloid process, subsequent encounter for closed fracture with routine healing: Secondary | ICD-10-CM | POA: Diagnosis not present

## 2016-11-27 DIAGNOSIS — Z9181 History of falling: Secondary | ICD-10-CM | POA: Diagnosis not present

## 2016-11-27 DIAGNOSIS — Z466 Encounter for fitting and adjustment of urinary device: Secondary | ICD-10-CM | POA: Diagnosis not present

## 2016-11-27 DIAGNOSIS — I10 Essential (primary) hypertension: Secondary | ICD-10-CM | POA: Diagnosis not present

## 2016-11-27 DIAGNOSIS — Z9981 Dependence on supplemental oxygen: Secondary | ICD-10-CM | POA: Diagnosis not present

## 2016-11-27 DIAGNOSIS — S72002D Fracture of unspecified part of neck of left femur, subsequent encounter for closed fracture with routine healing: Secondary | ICD-10-CM | POA: Diagnosis not present

## 2016-11-27 NOTE — Progress Notes (Signed)
Cardiology Office Note   Date:  11/28/2016   ID:  Vickie SnowballHelen M Hall, DOB [birth year], MRN [id number]  PCP:  Rudi HeapMOORE, DONALD, MD  Cardiologist:   Rollene RotundaJames Tell Rozelle, MD  Referring:  Dr. Ermalinda MemosBradshaw  Chief Complaint  Patient presents with  . Abnormal ECG      History of Present Illness: Vickie Hall is a 83 y.o. female who presents for evaluation of atrial fib.  The patient was in the hospital broken hip or in February. I reviewed these records for this office visit. She is referred here because her recent EKG in the office suggested atrial fibrillation. I reviewed the hospital records she was mention that have sinus bradycardia although the EKG was interpreted is fibrillation. She certainly has frequent atrial ectopy with multifocal atrial tachycardia versus atrial fibrillation. She lives in a nursing home. Since having hip and left wrist surgery she's been getting physical therapy but she doesn't walk independently. She's starting to walk a little bit with PT. She has dementia and doesn't remember any details. She doesn't report that she's ever had any problems with her heart and I don't see any previous workup. She's had no chest pressure, neck or arm discomfort. He doesn't report palpitations, presyncope or syncope. She has no PND or orthopnea. He is currently on 2.5 mg Eliquis for DVT prophylaxis.     Past Medical History:  Diagnosis Date  . GERD (gastroesophageal reflux disease)   . Hyperlipidemia   . Hypertension   . Memory deficit   . Osteoporosis     Past Surgical History:  Procedure Laterality Date  . ABDOMINAL HYSTERECTOMY    . EYE SURGERY    . INTRAMEDULLARY (IM) NAIL INTERTROCHANTERIC Left 10/29/2016   Procedure: INTRAMEDULLARY (IM) NAIL INTERTROCHANTRIC;  Surgeon: Samson FredericBrian Swinteck, MD;  Location: [facility] OR;  Service: Orthopedics;  Laterality: Left;  . ORIF WRIST FRACTURE Left 11/01/2016   Procedure: OPEN REDUCTION INTERNAL FIXATION (ORIF) WRIST FRACTURE;  Surgeon: Dominica SeverinWilliam Gramig, MD;   Location: [facility] OR;  Service: Orthopedics;  Laterality: Left;     Current Outpatient Prescriptions  Medication Sig Dispense Refill  . acetaminophen (TYLENOL) 325 MG tablet Take 2 tablets (650 mg total) by mouth every 6 (six) hours as needed for mild pain (or Fever >/= 101).    Marland Kitchen. alum & mag hydroxide-simeth (MAALOX/MYLANTA) 200-200-20 MG/5ML suspension Take 30 mLs by mouth as needed for indigestion or heartburn. 355 mL 0  . apixaban (ELIQUIS) 2.5 MG TABS tablet Take 1 tablet (2.5 mg total) by mouth 2 (two) times daily. Please take for 21 days for DVT prophylaxis and then stop 42 tablet 0  . cephALEXin (KEFLEX) 500 MG capsule Take 1 capsule (500 mg total) by mouth 3 (three) times daily. 21 capsule 0  . ezetimibe (ZETIA) 10 MG tablet Take 10 mg by mouth daily.    . feeding supplement, ENSURE ENLIVE, (ENSURE ENLIVE) LIQD Take 237 mLs by mouth 2 (two) times daily between meals. 237 mL 12  . ferrous sulfate 325 (65 FE) MG tablet Take 1 tablet (325 mg total) by mouth daily with breakfast. 30 tablet 3  . HYDROcodone-acetaminophen (NORCO/VICODIN) 5-325 MG tablet Take 1 tablet by mouth every 6 (six) hours as needed for moderate pain. 20 tablet 0  . Memantine HCl-Donepezil HCl (NAMZARIC) 28-10 MG CP24 Take 1 tablet by mouth at bedtime. 30 capsule 5  . senna-docusate (SENOKOT-S) 8.6-50 MG tablet Take 2 tablets by mouth 2 (two) times daily. 30 tablet 0  . valsartan (DIOVAN) 160  MG tablet     . Vitamin D, Ergocalciferol, (DRISDOL) 50000 UNITS CAPS capsule Take 1 capsule (50,000 Units total) by mouth every 7 (seven) days. 12 capsule 1   No current facility-administered medications for this visit.     Allergies:   Actonel [risedronate sodium] and Lipitor [atorvastatin]    Social History:  The patient  reports that she has never smoked. She has never used smokeless tobacco. She reports that she does not drink alcohol or use drugs.   Family History:  The patient's Family history is unknown by patient.     ROS:  Please see the history of present illness.   Otherwise, review of systems are positive for none.   All other systems are reviewed and negative.   Review of systems is very compromised by her dementia.   PHYSICAL EXAM: VS:  BP 110/68 (BP Location: Right Arm, Patient Position: Sitting, Cuff Size: Normal)   Pulse 61   Ht 5\' 2"  (1.575 m)   Wt 146 lb (66.2 kg)   BMI 26.70 kg/m  , BMI Body mass index is 26.7 kg/m. GEN:  No distress NECK:  No jugular venous distention at 90 degrees, waveform within normal limits, carotid upstroke brisk and symmetric, no bruits, no thyromegaly LYMPHATICS:  No cervical adenopathy LUNGS:  Clear to auscultation bilaterally BACK:  No CVA tenderness CHEST:  Unremarkable HEART:  S1 and S2 within normal limits, no S3, no S4, no clicks, no rubs, no murmurs ABD:  Positive bowel sounds normal in frequency in pitch, no bruits, no rebound, no guarding, unable to assess midline mass or bruit with the patient seated. EXT:  2 plus pulses throughout, moderate edema, no cyanosis no clubbing SKIN:  No rashes no nodules NEURO:  Cranial nerves II through XII grossly intact, motor grossly intact throughout PSYCH:  Cognitively intact, oriented to person place and time    EKG:  EKG is ordered today. The ekg ordered today demonstrates sinus rhythm, rate 62, axis within normal limits, intervals within normal limits, no acute ST-T wave changes.   Recent Labs: 10/28/2016: TSH 1.627 11/05/2016: Hemoglobin 9.1 11/12/2016: ALT 17; BUN 20; Creatinine, Ser 0.58; Platelets 632; Potassium 4.2; Sodium 140    Lipid Panel    Component Value Date/Time   CHOL 186 09/28/2016 1459   TRIG 180 (H) 09/28/2016 1459   TRIG 224 (H) 11/11/2013 1710   HDL 41 09/28/2016 1459   HDL 41 11/11/2013 1710   CHOLHDL 4.5 (H) 09/28/2016 1459   LDLCALC 109 (H) 09/28/2016 1459   LDLCALC 121 (H) 11/11/2013 1710      Wt Readings from Last 3 Encounters:  11/28/16 146 lb (66.2 kg)  11/12/16 147  lb (66.7 kg)  09/28/16 147 lb (66.7 kg)      Other studies Reviewed: Additional studies/ records that were reviewed today include: Hospital records. Review of the above records demonstrates:  Please see elsewhere in the note.     ASSESSMENT AND PLAN:  ATRIAL FIB:  I will order a 48-hour Holter. She may have had some short runs of fibrillation in the hospital although the physician notes don't document this. She gets and junctional rhythms and some irregularity on EKG. He's not entirely clear that this was atrial fibrillation and it was at the time of acute illness. She would be slightly higher risk given her frail status for anticoagulation. Therefore, I'm going to try to capture atrial fibrillation now in the outpatient setting with a 48-hour Holter prior to assigning her to  continue Eliquis.    HTN:  The blood pressure is at target. No change in medications is indicated. We will continue with therapeutic lifestyle changes (TLC).    Current medicines are reviewed at length with the patient today.  The patient does not have concerns regarding medicines.  The following changes have been made:  no change  Labs/ tests ordered today include:   Orders Placed This Encounter  Procedures  . Holter monitor - 48 hour  . EKG 12-Lead     Disposition:   FU with me after the Holter.     Signed, Rollene Rotunda, MD  11/28/2016 1:37 PM    [facility]

## 2016-11-28 ENCOUNTER — Telehealth: Payer: Self-pay

## 2016-11-28 ENCOUNTER — Telehealth: Payer: Self-pay | Admitting: Family Medicine

## 2016-11-28 ENCOUNTER — Ambulatory Visit (INDEPENDENT_AMBULATORY_CARE_PROVIDER_SITE_OTHER): Payer: Medicare HMO | Admitting: Cardiology

## 2016-11-28 ENCOUNTER — Encounter: Payer: Self-pay | Admitting: Cardiology

## 2016-11-28 VITALS — BP 110/68 | HR 61 | Ht 62.0 in | Wt 146.0 lb

## 2016-11-28 DIAGNOSIS — R9431 Abnormal electrocardiogram [ECG] [EKG]: Secondary | ICD-10-CM

## 2016-11-28 DIAGNOSIS — Z9981 Dependence on supplemental oxygen: Secondary | ICD-10-CM | POA: Diagnosis not present

## 2016-11-28 DIAGNOSIS — Z466 Encounter for fitting and adjustment of urinary device: Secondary | ICD-10-CM | POA: Diagnosis not present

## 2016-11-28 DIAGNOSIS — I491 Atrial premature depolarization: Secondary | ICD-10-CM

## 2016-11-28 DIAGNOSIS — I10 Essential (primary) hypertension: Secondary | ICD-10-CM | POA: Diagnosis not present

## 2016-11-28 DIAGNOSIS — F0390 Unspecified dementia without behavioral disturbance: Secondary | ICD-10-CM | POA: Diagnosis not present

## 2016-11-28 DIAGNOSIS — W19XXXD Unspecified fall, subsequent encounter: Secondary | ICD-10-CM

## 2016-11-28 DIAGNOSIS — S72002D Fracture of unspecified part of neck of left femur, subsequent encounter for closed fracture with routine healing: Secondary | ICD-10-CM | POA: Diagnosis not present

## 2016-11-28 DIAGNOSIS — Z960 Presence of urogenital implants: Principal | ICD-10-CM

## 2016-11-28 DIAGNOSIS — S52612D Displaced fracture of left ulna styloid process, subsequent encounter for closed fracture with routine healing: Secondary | ICD-10-CM | POA: Diagnosis not present

## 2016-11-28 DIAGNOSIS — Z9181 History of falling: Secondary | ICD-10-CM | POA: Diagnosis not present

## 2016-11-28 DIAGNOSIS — Z978 Presence of other specified devices: Secondary | ICD-10-CM

## 2016-11-28 DIAGNOSIS — Z7901 Long term (current) use of anticoagulants: Secondary | ICD-10-CM | POA: Diagnosis not present

## 2016-11-28 DIAGNOSIS — S52502D Unspecified fracture of the lower end of left radius, subsequent encounter for closed fracture with routine healing: Secondary | ICD-10-CM | POA: Diagnosis not present

## 2016-11-28 NOTE — Telephone Encounter (Signed)
I initially canceled the first referral, I placed the second after being called and told that she failed her voiding trial.   I agree with urology referral.   Murtis SinkSam Ayde Record, MD [facility] 11/28/2016, 12:24 PM

## 2016-11-28 NOTE — Patient Instructions (Signed)
Medication Instructions:  Continue current medications  Labwork: None Ordered  Testing/Procedures: Your physician has recommended that you wear a holter monitor for 48 hours. Holter monitors are medical devices that record the heart's electrical activity. Doctors most often use these monitors to diagnose arrhythmias. Arrhythmias are problems with the speed or rhythm of the heartbeat. The monitor is a small, portable device. You can wear one while you do your normal daily activities. This is usually used to diagnose what is causing palpitations/syncope (passing out).   Follow-Up: Your physician recommends that you schedule a follow-up appointment in: After Monitor [facility]   Any Other Special Instructions Will Be Listed Below (If Applicable).   If you need a refill on your cardiac medications before your next appointment, please call your pharmacy.

## 2016-11-28 NOTE — Telephone Encounter (Signed)
Dr. Antoine PocheHochrein wants patient to have a 48 hour Holter Monitor put on at our office.  Would you please call Emelia Salisburyee Dee at Dr. Jenene SlickerHochrein's office and let her know when the patient can come in and get one put on.

## 2016-11-29 ENCOUNTER — Ambulatory Visit (INDEPENDENT_AMBULATORY_CARE_PROVIDER_SITE_OTHER): Payer: Medicare HMO | Admitting: *Deleted

## 2016-11-29 DIAGNOSIS — Z9981 Dependence on supplemental oxygen: Secondary | ICD-10-CM | POA: Diagnosis not present

## 2016-11-29 DIAGNOSIS — S52502D Unspecified fracture of the lower end of left radius, subsequent encounter for closed fracture with routine healing: Secondary | ICD-10-CM | POA: Diagnosis not present

## 2016-11-29 DIAGNOSIS — S52612D Displaced fracture of left ulna styloid process, subsequent encounter for closed fracture with routine healing: Secondary | ICD-10-CM | POA: Diagnosis not present

## 2016-11-29 DIAGNOSIS — I10 Essential (primary) hypertension: Secondary | ICD-10-CM | POA: Diagnosis not present

## 2016-11-29 DIAGNOSIS — F0390 Unspecified dementia without behavioral disturbance: Secondary | ICD-10-CM | POA: Diagnosis not present

## 2016-11-29 DIAGNOSIS — Z7901 Long term (current) use of anticoagulants: Secondary | ICD-10-CM | POA: Diagnosis not present

## 2016-11-29 DIAGNOSIS — Z9181 History of falling: Secondary | ICD-10-CM | POA: Diagnosis not present

## 2016-11-29 DIAGNOSIS — I491 Atrial premature depolarization: Secondary | ICD-10-CM | POA: Diagnosis not present

## 2016-11-29 DIAGNOSIS — Z466 Encounter for fitting and adjustment of urinary device: Secondary | ICD-10-CM | POA: Diagnosis not present

## 2016-11-29 DIAGNOSIS — S72002D Fracture of unspecified part of neck of left femur, subsequent encounter for closed fracture with routine healing: Secondary | ICD-10-CM | POA: Diagnosis not present

## 2016-11-29 NOTE — Progress Notes (Signed)
holter monitor placed Asset # [id number] 

## 2016-11-30 DIAGNOSIS — Z7901 Long term (current) use of anticoagulants: Secondary | ICD-10-CM | POA: Diagnosis not present

## 2016-11-30 DIAGNOSIS — F0390 Unspecified dementia without behavioral disturbance: Secondary | ICD-10-CM | POA: Diagnosis not present

## 2016-11-30 DIAGNOSIS — Z466 Encounter for fitting and adjustment of urinary device: Secondary | ICD-10-CM | POA: Diagnosis not present

## 2016-11-30 DIAGNOSIS — S52612D Displaced fracture of left ulna styloid process, subsequent encounter for closed fracture with routine healing: Secondary | ICD-10-CM | POA: Diagnosis not present

## 2016-11-30 DIAGNOSIS — I10 Essential (primary) hypertension: Secondary | ICD-10-CM | POA: Diagnosis not present

## 2016-11-30 DIAGNOSIS — Z9181 History of falling: Secondary | ICD-10-CM | POA: Diagnosis not present

## 2016-11-30 DIAGNOSIS — S52502D Unspecified fracture of the lower end of left radius, subsequent encounter for closed fracture with routine healing: Secondary | ICD-10-CM | POA: Diagnosis not present

## 2016-11-30 DIAGNOSIS — Z9981 Dependence on supplemental oxygen: Secondary | ICD-10-CM | POA: Diagnosis not present

## 2016-11-30 DIAGNOSIS — S72002D Fracture of unspecified part of neck of left femur, subsequent encounter for closed fracture with routine healing: Secondary | ICD-10-CM | POA: Diagnosis not present

## 2016-11-30 DIAGNOSIS — I4891 Unspecified atrial fibrillation: Secondary | ICD-10-CM | POA: Diagnosis not present

## 2016-12-02 DIAGNOSIS — Z7901 Long term (current) use of anticoagulants: Secondary | ICD-10-CM | POA: Diagnosis not present

## 2016-12-02 DIAGNOSIS — S52612D Displaced fracture of left ulna styloid process, subsequent encounter for closed fracture with routine healing: Secondary | ICD-10-CM | POA: Diagnosis not present

## 2016-12-02 DIAGNOSIS — S72002D Fracture of unspecified part of neck of left femur, subsequent encounter for closed fracture with routine healing: Secondary | ICD-10-CM | POA: Diagnosis not present

## 2016-12-02 DIAGNOSIS — Z9981 Dependence on supplemental oxygen: Secondary | ICD-10-CM | POA: Diagnosis not present

## 2016-12-02 DIAGNOSIS — Z9181 History of falling: Secondary | ICD-10-CM | POA: Diagnosis not present

## 2016-12-02 DIAGNOSIS — I10 Essential (primary) hypertension: Secondary | ICD-10-CM | POA: Diagnosis not present

## 2016-12-02 DIAGNOSIS — F0390 Unspecified dementia without behavioral disturbance: Secondary | ICD-10-CM | POA: Diagnosis not present

## 2016-12-02 DIAGNOSIS — Z466 Encounter for fitting and adjustment of urinary device: Secondary | ICD-10-CM | POA: Diagnosis not present

## 2016-12-02 DIAGNOSIS — S52502D Unspecified fracture of the lower end of left radius, subsequent encounter for closed fracture with routine healing: Secondary | ICD-10-CM | POA: Diagnosis not present

## 2016-12-03 DIAGNOSIS — F0390 Unspecified dementia without behavioral disturbance: Secondary | ICD-10-CM | POA: Diagnosis not present

## 2016-12-03 DIAGNOSIS — S52612D Displaced fracture of left ulna styloid process, subsequent encounter for closed fracture with routine healing: Secondary | ICD-10-CM | POA: Diagnosis not present

## 2016-12-03 DIAGNOSIS — S72002D Fracture of unspecified part of neck of left femur, subsequent encounter for closed fracture with routine healing: Secondary | ICD-10-CM | POA: Diagnosis not present

## 2016-12-03 DIAGNOSIS — I10 Essential (primary) hypertension: Secondary | ICD-10-CM | POA: Diagnosis not present

## 2016-12-03 DIAGNOSIS — Z7901 Long term (current) use of anticoagulants: Secondary | ICD-10-CM | POA: Diagnosis not present

## 2016-12-03 DIAGNOSIS — Z466 Encounter for fitting and adjustment of urinary device: Secondary | ICD-10-CM | POA: Diagnosis not present

## 2016-12-03 DIAGNOSIS — Z9981 Dependence on supplemental oxygen: Secondary | ICD-10-CM | POA: Diagnosis not present

## 2016-12-03 DIAGNOSIS — Z9181 History of falling: Secondary | ICD-10-CM | POA: Diagnosis not present

## 2016-12-03 DIAGNOSIS — S52502D Unspecified fracture of the lower end of left radius, subsequent encounter for closed fracture with routine healing: Secondary | ICD-10-CM | POA: Diagnosis not present

## 2016-12-05 DIAGNOSIS — Z9981 Dependence on supplemental oxygen: Secondary | ICD-10-CM | POA: Diagnosis not present

## 2016-12-05 DIAGNOSIS — Z9181 History of falling: Secondary | ICD-10-CM | POA: Diagnosis not present

## 2016-12-05 DIAGNOSIS — I10 Essential (primary) hypertension: Secondary | ICD-10-CM | POA: Diagnosis not present

## 2016-12-05 DIAGNOSIS — F0390 Unspecified dementia without behavioral disturbance: Secondary | ICD-10-CM | POA: Diagnosis not present

## 2016-12-05 DIAGNOSIS — Z7901 Long term (current) use of anticoagulants: Secondary | ICD-10-CM | POA: Diagnosis not present

## 2016-12-05 DIAGNOSIS — S52502D Unspecified fracture of the lower end of left radius, subsequent encounter for closed fracture with routine healing: Secondary | ICD-10-CM | POA: Diagnosis not present

## 2016-12-05 DIAGNOSIS — S72002D Fracture of unspecified part of neck of left femur, subsequent encounter for closed fracture with routine healing: Secondary | ICD-10-CM | POA: Diagnosis not present

## 2016-12-05 DIAGNOSIS — Z466 Encounter for fitting and adjustment of urinary device: Secondary | ICD-10-CM | POA: Diagnosis not present

## 2016-12-05 DIAGNOSIS — S52612D Displaced fracture of left ulna styloid process, subsequent encounter for closed fracture with routine healing: Secondary | ICD-10-CM | POA: Diagnosis not present

## 2016-12-06 DIAGNOSIS — Z9981 Dependence on supplemental oxygen: Secondary | ICD-10-CM | POA: Diagnosis not present

## 2016-12-06 DIAGNOSIS — S52502D Unspecified fracture of the lower end of left radius, subsequent encounter for closed fracture with routine healing: Secondary | ICD-10-CM | POA: Diagnosis not present

## 2016-12-06 DIAGNOSIS — S52612D Displaced fracture of left ulna styloid process, subsequent encounter for closed fracture with routine healing: Secondary | ICD-10-CM | POA: Diagnosis not present

## 2016-12-06 DIAGNOSIS — F0390 Unspecified dementia without behavioral disturbance: Secondary | ICD-10-CM | POA: Diagnosis not present

## 2016-12-06 DIAGNOSIS — I10 Essential (primary) hypertension: Secondary | ICD-10-CM | POA: Diagnosis not present

## 2016-12-06 DIAGNOSIS — Z9181 History of falling: Secondary | ICD-10-CM | POA: Diagnosis not present

## 2016-12-06 DIAGNOSIS — Z7901 Long term (current) use of anticoagulants: Secondary | ICD-10-CM | POA: Diagnosis not present

## 2016-12-06 DIAGNOSIS — S72002D Fracture of unspecified part of neck of left femur, subsequent encounter for closed fracture with routine healing: Secondary | ICD-10-CM | POA: Diagnosis not present

## 2016-12-06 DIAGNOSIS — Z466 Encounter for fitting and adjustment of urinary device: Secondary | ICD-10-CM | POA: Diagnosis not present

## 2016-12-07 DIAGNOSIS — Z9981 Dependence on supplemental oxygen: Secondary | ICD-10-CM | POA: Diagnosis not present

## 2016-12-07 DIAGNOSIS — S52612D Displaced fracture of left ulna styloid process, subsequent encounter for closed fracture with routine healing: Secondary | ICD-10-CM | POA: Diagnosis not present

## 2016-12-07 DIAGNOSIS — S52502D Unspecified fracture of the lower end of left radius, subsequent encounter for closed fracture with routine healing: Secondary | ICD-10-CM | POA: Diagnosis not present

## 2016-12-07 DIAGNOSIS — Z9181 History of falling: Secondary | ICD-10-CM | POA: Diagnosis not present

## 2016-12-07 DIAGNOSIS — Z7901 Long term (current) use of anticoagulants: Secondary | ICD-10-CM | POA: Diagnosis not present

## 2016-12-07 DIAGNOSIS — I10 Essential (primary) hypertension: Secondary | ICD-10-CM | POA: Diagnosis not present

## 2016-12-07 DIAGNOSIS — Z466 Encounter for fitting and adjustment of urinary device: Secondary | ICD-10-CM | POA: Diagnosis not present

## 2016-12-07 DIAGNOSIS — S72002D Fracture of unspecified part of neck of left femur, subsequent encounter for closed fracture with routine healing: Secondary | ICD-10-CM | POA: Diagnosis not present

## 2016-12-07 DIAGNOSIS — F0390 Unspecified dementia without behavioral disturbance: Secondary | ICD-10-CM | POA: Diagnosis not present

## 2016-12-10 DIAGNOSIS — Z466 Encounter for fitting and adjustment of urinary device: Secondary | ICD-10-CM | POA: Diagnosis not present

## 2016-12-10 DIAGNOSIS — Z9181 History of falling: Secondary | ICD-10-CM | POA: Diagnosis not present

## 2016-12-10 DIAGNOSIS — S52502D Unspecified fracture of the lower end of left radius, subsequent encounter for closed fracture with routine healing: Secondary | ICD-10-CM | POA: Diagnosis not present

## 2016-12-10 DIAGNOSIS — Z9981 Dependence on supplemental oxygen: Secondary | ICD-10-CM | POA: Diagnosis not present

## 2016-12-10 DIAGNOSIS — Z7901 Long term (current) use of anticoagulants: Secondary | ICD-10-CM | POA: Diagnosis not present

## 2016-12-10 DIAGNOSIS — F0390 Unspecified dementia without behavioral disturbance: Secondary | ICD-10-CM | POA: Diagnosis not present

## 2016-12-10 DIAGNOSIS — I10 Essential (primary) hypertension: Secondary | ICD-10-CM | POA: Diagnosis not present

## 2016-12-10 DIAGNOSIS — S52612D Displaced fracture of left ulna styloid process, subsequent encounter for closed fracture with routine healing: Secondary | ICD-10-CM | POA: Diagnosis not present

## 2016-12-10 DIAGNOSIS — S72002D Fracture of unspecified part of neck of left femur, subsequent encounter for closed fracture with routine healing: Secondary | ICD-10-CM | POA: Diagnosis not present

## 2016-12-11 DIAGNOSIS — F0390 Unspecified dementia without behavioral disturbance: Secondary | ICD-10-CM | POA: Diagnosis not present

## 2016-12-11 DIAGNOSIS — I10 Essential (primary) hypertension: Secondary | ICD-10-CM | POA: Diagnosis not present

## 2016-12-11 DIAGNOSIS — S52612D Displaced fracture of left ulna styloid process, subsequent encounter for closed fracture with routine healing: Secondary | ICD-10-CM | POA: Diagnosis not present

## 2016-12-11 DIAGNOSIS — Z7901 Long term (current) use of anticoagulants: Secondary | ICD-10-CM | POA: Diagnosis not present

## 2016-12-11 DIAGNOSIS — Z9181 History of falling: Secondary | ICD-10-CM | POA: Diagnosis not present

## 2016-12-11 DIAGNOSIS — S52502D Unspecified fracture of the lower end of left radius, subsequent encounter for closed fracture with routine healing: Secondary | ICD-10-CM | POA: Diagnosis not present

## 2016-12-11 DIAGNOSIS — Z9981 Dependence on supplemental oxygen: Secondary | ICD-10-CM | POA: Diagnosis not present

## 2016-12-11 DIAGNOSIS — Z466 Encounter for fitting and adjustment of urinary device: Secondary | ICD-10-CM | POA: Diagnosis not present

## 2016-12-11 DIAGNOSIS — S72002D Fracture of unspecified part of neck of left femur, subsequent encounter for closed fracture with routine healing: Secondary | ICD-10-CM | POA: Diagnosis not present

## 2016-12-12 DIAGNOSIS — F0390 Unspecified dementia without behavioral disturbance: Secondary | ICD-10-CM | POA: Diagnosis not present

## 2016-12-12 DIAGNOSIS — Z7901 Long term (current) use of anticoagulants: Secondary | ICD-10-CM | POA: Diagnosis not present

## 2016-12-12 DIAGNOSIS — S72002D Fracture of unspecified part of neck of left femur, subsequent encounter for closed fracture with routine healing: Secondary | ICD-10-CM | POA: Diagnosis not present

## 2016-12-12 DIAGNOSIS — Z466 Encounter for fitting and adjustment of urinary device: Secondary | ICD-10-CM | POA: Diagnosis not present

## 2016-12-12 DIAGNOSIS — S52612D Displaced fracture of left ulna styloid process, subsequent encounter for closed fracture with routine healing: Secondary | ICD-10-CM | POA: Diagnosis not present

## 2016-12-12 DIAGNOSIS — S52502D Unspecified fracture of the lower end of left radius, subsequent encounter for closed fracture with routine healing: Secondary | ICD-10-CM | POA: Diagnosis not present

## 2016-12-12 DIAGNOSIS — I10 Essential (primary) hypertension: Secondary | ICD-10-CM | POA: Diagnosis not present

## 2016-12-12 DIAGNOSIS — Z9981 Dependence on supplemental oxygen: Secondary | ICD-10-CM | POA: Diagnosis not present

## 2016-12-12 DIAGNOSIS — Z9181 History of falling: Secondary | ICD-10-CM | POA: Diagnosis not present

## 2016-12-13 DIAGNOSIS — Z7901 Long term (current) use of anticoagulants: Secondary | ICD-10-CM | POA: Diagnosis not present

## 2016-12-13 DIAGNOSIS — I10 Essential (primary) hypertension: Secondary | ICD-10-CM | POA: Diagnosis not present

## 2016-12-13 DIAGNOSIS — S62102D Fracture of unspecified carpal bone, left wrist, subsequent encounter for fracture with routine healing: Secondary | ICD-10-CM | POA: Diagnosis not present

## 2016-12-13 DIAGNOSIS — S52502D Unspecified fracture of the lower end of left radius, subsequent encounter for closed fracture with routine healing: Secondary | ICD-10-CM | POA: Diagnosis not present

## 2016-12-13 DIAGNOSIS — Z466 Encounter for fitting and adjustment of urinary device: Secondary | ICD-10-CM | POA: Diagnosis not present

## 2016-12-13 DIAGNOSIS — S52612D Displaced fracture of left ulna styloid process, subsequent encounter for closed fracture with routine healing: Secondary | ICD-10-CM | POA: Diagnosis not present

## 2016-12-13 DIAGNOSIS — Z9981 Dependence on supplemental oxygen: Secondary | ICD-10-CM | POA: Diagnosis not present

## 2016-12-13 DIAGNOSIS — S72142D Displaced intertrochanteric fracture of left femur, subsequent encounter for closed fracture with routine healing: Secondary | ICD-10-CM | POA: Diagnosis not present

## 2016-12-13 DIAGNOSIS — F0390 Unspecified dementia without behavioral disturbance: Secondary | ICD-10-CM | POA: Diagnosis not present

## 2016-12-13 DIAGNOSIS — S72002D Fracture of unspecified part of neck of left femur, subsequent encounter for closed fracture with routine healing: Secondary | ICD-10-CM | POA: Diagnosis not present

## 2016-12-13 DIAGNOSIS — Z4789 Encounter for other orthopedic aftercare: Secondary | ICD-10-CM | POA: Diagnosis not present

## 2016-12-13 DIAGNOSIS — Z9181 History of falling: Secondary | ICD-10-CM | POA: Diagnosis not present

## 2016-12-13 NOTE — Progress Notes (Signed)
Cardiology Office Note   Date:  12/16/2016   ID:  TERRA AVENI, DOB 01-Dec-1927, MRN [id number]  PCP:  Rudi Heap, MD  Cardiologist:   Rollene Rotunda, MD  Referring:  Dr. Ermalinda Memos  Chief Complaint  Patient presents with  . Irregular Heart Beat      History of Present Illness: Vickie Hall is a 83 y.o. female who presents for evaluation of atrial fib.  The patient was in the hospital broken hip in February. I saw her earlier this month with possible atrial fib although I could not find documentation of this.  I sent her for a Holter but she only wore it for four hours on both days it was ordered.  and there was no atrial fib.  She denies any overt cardiovascular symptoms.  The patient denies any new symptoms such as chest discomfort, neck or arm discomfort. There has been no new shortness of breath, PND or orthopnea. There have been no reported palpitations, presyncope or syncope.  She is very frail and limited to a wheelchair.     Past Medical History:  Diagnosis Date  . GERD (gastroesophageal reflux disease)   . Hyperlipidemia   . Hypertension   . Memory deficit   . Osteoporosis     Past Surgical History:  Procedure Laterality Date  . ABDOMINAL HYSTERECTOMY    . EYE SURGERY    . INTRAMEDULLARY (IM) NAIL INTERTROCHANTERIC Left 10/29/2016   Procedure: INTRAMEDULLARY (IM) NAIL INTERTROCHANTRIC;  Surgeon: Samson Frederic, MD;  Location: [facility] OR;  Service: Orthopedics;  Laterality: Left;  . ORIF WRIST FRACTURE Left 11/01/2016   Procedure: OPEN REDUCTION INTERNAL FIXATION (ORIF) WRIST FRACTURE;  Surgeon: Dominica Severin, MD;  Location: [facility] OR;  Service: Orthopedics;  Laterality: Left;     Current Outpatient Prescriptions  Medication Sig Dispense Refill  . acetaminophen (TYLENOL) 325 MG tablet Take 2 tablets (650 mg total) by mouth every 6 (six) hours as needed for mild pain (or Fever >/= 101).    Marland Kitchen alum & mag hydroxide-simeth (MAALOX/MYLANTA) 200-200-20 MG/5ML suspension Take  30 mLs by mouth as needed for indigestion or heartburn. 355 mL 0  . apixaban (ELIQUIS) 2.5 MG TABS tablet Take 1 tablet (2.5 mg total) by mouth 2 (two) times daily. Please take for 21 days for DVT prophylaxis and then stop 42 tablet 0  . cephALEXin (KEFLEX) 500 MG capsule Take 1 capsule (500 mg total) by mouth 3 (three) times daily. 21 capsule 0  . ezetimibe (ZETIA) 10 MG tablet Take 10 mg by mouth daily.    . feeding supplement, ENSURE ENLIVE, (ENSURE ENLIVE) LIQD Take 237 mLs by mouth 2 (two) times daily between meals. 237 mL 12  . ferrous sulfate 325 (65 FE) MG tablet Take 1 tablet (325 mg total) by mouth daily with breakfast. 30 tablet 3  . HYDROcodone-acetaminophen (NORCO/VICODIN) 5-325 MG tablet Take 1 tablet by mouth every 6 (six) hours as needed for moderate pain. 20 tablet 0  . Memantine HCl-Donepezil HCl (NAMZARIC) 28-10 MG CP24 Take 1 tablet by mouth at bedtime. 30 capsule 5  . senna-docusate (SENOKOT-S) 8.6-50 MG tablet Take 2 tablets by mouth 2 (two) times daily. 30 tablet 0  . valsartan (DIOVAN) 160 MG tablet     . Vitamin D, Ergocalciferol, (DRISDOL) 50000 UNITS CAPS capsule Take 1 capsule (50,000 Units total) by mouth every 7 (seven) days. 12 capsule 1   No current facility-administered medications for this visit.     Allergies:   Actonel [  risedronate sodium] and Lipitor [atorvastatin]     ROS:  Please see the history of present illness.   Otherwise, review of systems are positive for none.   All other systems are reviewed and negative.   Review of systems is very compromised by her dementia.   PHYSICAL EXAM: VS:  BP 131/78   Pulse 65   Ht 5\' 2"  (1.575 m)  , BMI There is no height or weight on file to calculate BMI. GEN:  No distress NECK:  No jugular venous distention at 90 degrees, waveform within normal limits, carotid upstroke brisk and symmetric, no bruits, no thyromegaly LUNGS:  Clear to auscultation bilaterally CHEST:  Unremarkable HEART:  S1 and S2 within normal  limits, no S3, no S4, no clicks, no rubs, no murmurs ABD:  Positive bowel sounds normal in frequency in pitch, no bruits, no rebound, no guarding, unable to assess midline mass or bruit with the patient seated. EXT:  2 plus pulses throughout, moderate edema, no cyanosis no clubbing SKIN:  No rashes no nodules    EKG:  EKG is not  ordered today.    Recent Labs: 10/28/2016: TSH 1.627 11/05/2016: Hemoglobin 9.1 11/12/2016: ALT 17; BUN 20; Creatinine, Ser 0.58; Platelets 632; Potassium 4.2; Sodium 140    Lipid Panel    Component Value Date/Time   CHOL 186 09/28/2016 1459   TRIG 180 (H) 09/28/2016 1459   TRIG 224 (H) 11/11/2013 1710   HDL 41 09/28/2016 1459   HDL 41 11/11/2013 1710   CHOLHDL 4.5 (H) 09/28/2016 1459   LDLCALC 109 (H) 09/28/2016 1459   LDLCALC 121 (H) 11/11/2013 1710      Wt Readings from Last 3 Encounters:  11/28/16 146 lb (66.2 kg)  11/12/16 147 lb (66.7 kg)  09/28/16 147 lb (66.7 kg)      Other studies Reviewed: Additional studies/ records that were reviewed today include: Holters. Review of the above records demonstrates:  As below.     ASSESSMENT AND PLAN:  ATRIAL FIB:  She's had no evidence of atrial fibrillation. She'll work 8 hours worth of a Holter. She would be a very poor anticoagulation candidate regardless and I don't see an absolute indication. No change in therapy or further evaluation is indicated.  HTN:   The blood pressure is at target. No change in medications is indicated. We will continue with therapeutic lifestyle changes (TLC).    Current medicines are reviewed at length with the patient today.  The patient does not have concerns regarding medicines.  The following changes have been made:  None  Labs/ tests ordered today include:  None  No orders of the defined types were placed in this encounter.    Disposition:   FU with me as needed. [name].     Signed, Jensyn Shave, MD  12/16/2016 10:05 AM    [facility]

## 2016-12-14 ENCOUNTER — Encounter (INDEPENDENT_AMBULATORY_CARE_PROVIDER_SITE_OTHER): Payer: Medicare HMO | Admitting: Family Medicine

## 2016-12-14 ENCOUNTER — Encounter: Payer: Self-pay | Admitting: Cardiology

## 2016-12-14 ENCOUNTER — Ambulatory Visit (INDEPENDENT_AMBULATORY_CARE_PROVIDER_SITE_OTHER): Payer: Medicare HMO | Admitting: Cardiology

## 2016-12-14 VITALS — BP 131/78 | HR 65 | Ht 62.0 in

## 2016-12-14 DIAGNOSIS — S72002D Fracture of unspecified part of neck of left femur, subsequent encounter for closed fracture with routine healing: Secondary | ICD-10-CM

## 2016-12-14 DIAGNOSIS — Z9981 Dependence on supplemental oxygen: Secondary | ICD-10-CM | POA: Diagnosis not present

## 2016-12-14 DIAGNOSIS — F0390 Unspecified dementia without behavioral disturbance: Secondary | ICD-10-CM

## 2016-12-14 DIAGNOSIS — M6281 Muscle weakness (generalized): Secondary | ICD-10-CM | POA: Diagnosis not present

## 2016-12-14 DIAGNOSIS — Z7901 Long term (current) use of anticoagulants: Secondary | ICD-10-CM | POA: Diagnosis not present

## 2016-12-14 DIAGNOSIS — I10 Essential (primary) hypertension: Secondary | ICD-10-CM

## 2016-12-14 DIAGNOSIS — Z9181 History of falling: Secondary | ICD-10-CM | POA: Diagnosis not present

## 2016-12-14 DIAGNOSIS — Z466 Encounter for fitting and adjustment of urinary device: Secondary | ICD-10-CM

## 2016-12-14 DIAGNOSIS — S52612D Displaced fracture of left ulna styloid process, subsequent encounter for closed fracture with routine healing: Secondary | ICD-10-CM | POA: Diagnosis not present

## 2016-12-14 DIAGNOSIS — S52502D Unspecified fracture of the lower end of left radius, subsequent encounter for closed fracture with routine healing: Secondary | ICD-10-CM | POA: Diagnosis not present

## 2016-12-14 DIAGNOSIS — R002 Palpitations: Secondary | ICD-10-CM

## 2016-12-14 NOTE — Patient Instructions (Signed)
Medication Instructions:  Continue current medications  Labwork: None Ordered  Testing/Procedures: None Ordered  Follow-Up: Your physician recommends that you schedule a follow-up appointment in: As Needed   Any Other Special Instructions Will Be Listed Below (If Applicable).   If you need a refill on your cardiac medications before your next appointment, please call your pharmacy.   

## 2016-12-18 DIAGNOSIS — Z466 Encounter for fitting and adjustment of urinary device: Secondary | ICD-10-CM | POA: Diagnosis not present

## 2016-12-18 DIAGNOSIS — I10 Essential (primary) hypertension: Secondary | ICD-10-CM | POA: Diagnosis not present

## 2016-12-18 DIAGNOSIS — S52502D Unspecified fracture of the lower end of left radius, subsequent encounter for closed fracture with routine healing: Secondary | ICD-10-CM | POA: Diagnosis not present

## 2016-12-18 DIAGNOSIS — S52612D Displaced fracture of left ulna styloid process, subsequent encounter for closed fracture with routine healing: Secondary | ICD-10-CM | POA: Diagnosis not present

## 2016-12-18 DIAGNOSIS — S72002D Fracture of unspecified part of neck of left femur, subsequent encounter for closed fracture with routine healing: Secondary | ICD-10-CM | POA: Diagnosis not present

## 2016-12-18 DIAGNOSIS — F0390 Unspecified dementia without behavioral disturbance: Secondary | ICD-10-CM | POA: Diagnosis not present

## 2016-12-18 DIAGNOSIS — Z9981 Dependence on supplemental oxygen: Secondary | ICD-10-CM | POA: Diagnosis not present

## 2016-12-18 DIAGNOSIS — Z9181 History of falling: Secondary | ICD-10-CM | POA: Diagnosis not present

## 2016-12-18 DIAGNOSIS — Z7901 Long term (current) use of anticoagulants: Secondary | ICD-10-CM | POA: Diagnosis not present

## 2016-12-20 DIAGNOSIS — S52612D Displaced fracture of left ulna styloid process, subsequent encounter for closed fracture with routine healing: Secondary | ICD-10-CM | POA: Diagnosis not present

## 2016-12-20 DIAGNOSIS — Z466 Encounter for fitting and adjustment of urinary device: Secondary | ICD-10-CM | POA: Diagnosis not present

## 2016-12-20 DIAGNOSIS — Z9181 History of falling: Secondary | ICD-10-CM | POA: Diagnosis not present

## 2016-12-20 DIAGNOSIS — I10 Essential (primary) hypertension: Secondary | ICD-10-CM | POA: Diagnosis not present

## 2016-12-20 DIAGNOSIS — F0390 Unspecified dementia without behavioral disturbance: Secondary | ICD-10-CM | POA: Diagnosis not present

## 2016-12-20 DIAGNOSIS — S72002D Fracture of unspecified part of neck of left femur, subsequent encounter for closed fracture with routine healing: Secondary | ICD-10-CM | POA: Diagnosis not present

## 2016-12-20 DIAGNOSIS — S52502D Unspecified fracture of the lower end of left radius, subsequent encounter for closed fracture with routine healing: Secondary | ICD-10-CM | POA: Diagnosis not present

## 2016-12-20 DIAGNOSIS — Z7901 Long term (current) use of anticoagulants: Secondary | ICD-10-CM | POA: Diagnosis not present

## 2016-12-20 DIAGNOSIS — Z9981 Dependence on supplemental oxygen: Secondary | ICD-10-CM | POA: Diagnosis not present

## 2016-12-21 DIAGNOSIS — Z7901 Long term (current) use of anticoagulants: Secondary | ICD-10-CM | POA: Diagnosis not present

## 2016-12-21 DIAGNOSIS — S52612D Displaced fracture of left ulna styloid process, subsequent encounter for closed fracture with routine healing: Secondary | ICD-10-CM | POA: Diagnosis not present

## 2016-12-21 DIAGNOSIS — Z9181 History of falling: Secondary | ICD-10-CM | POA: Diagnosis not present

## 2016-12-21 DIAGNOSIS — F0390 Unspecified dementia without behavioral disturbance: Secondary | ICD-10-CM | POA: Diagnosis not present

## 2016-12-21 DIAGNOSIS — S52502D Unspecified fracture of the lower end of left radius, subsequent encounter for closed fracture with routine healing: Secondary | ICD-10-CM | POA: Diagnosis not present

## 2016-12-21 DIAGNOSIS — Z466 Encounter for fitting and adjustment of urinary device: Secondary | ICD-10-CM | POA: Diagnosis not present

## 2016-12-21 DIAGNOSIS — S72002D Fracture of unspecified part of neck of left femur, subsequent encounter for closed fracture with routine healing: Secondary | ICD-10-CM | POA: Diagnosis not present

## 2016-12-21 DIAGNOSIS — I10 Essential (primary) hypertension: Secondary | ICD-10-CM | POA: Diagnosis not present

## 2016-12-21 DIAGNOSIS — Z9981 Dependence on supplemental oxygen: Secondary | ICD-10-CM | POA: Diagnosis not present

## 2016-12-22 DIAGNOSIS — F0390 Unspecified dementia without behavioral disturbance: Secondary | ICD-10-CM | POA: Diagnosis not present

## 2016-12-22 DIAGNOSIS — Z466 Encounter for fitting and adjustment of urinary device: Secondary | ICD-10-CM | POA: Diagnosis not present

## 2016-12-22 DIAGNOSIS — I10 Essential (primary) hypertension: Secondary | ICD-10-CM | POA: Diagnosis not present

## 2016-12-22 DIAGNOSIS — Z7901 Long term (current) use of anticoagulants: Secondary | ICD-10-CM | POA: Diagnosis not present

## 2016-12-22 DIAGNOSIS — Z9181 History of falling: Secondary | ICD-10-CM | POA: Diagnosis not present

## 2016-12-22 DIAGNOSIS — S52612D Displaced fracture of left ulna styloid process, subsequent encounter for closed fracture with routine healing: Secondary | ICD-10-CM | POA: Diagnosis not present

## 2016-12-22 DIAGNOSIS — S72002D Fracture of unspecified part of neck of left femur, subsequent encounter for closed fracture with routine healing: Secondary | ICD-10-CM | POA: Diagnosis not present

## 2016-12-22 DIAGNOSIS — S52502D Unspecified fracture of the lower end of left radius, subsequent encounter for closed fracture with routine healing: Secondary | ICD-10-CM | POA: Diagnosis not present

## 2016-12-22 DIAGNOSIS — Z9981 Dependence on supplemental oxygen: Secondary | ICD-10-CM | POA: Diagnosis not present

## 2016-12-26 DIAGNOSIS — S52612D Displaced fracture of left ulna styloid process, subsequent encounter for closed fracture with routine healing: Secondary | ICD-10-CM | POA: Diagnosis not present

## 2016-12-26 DIAGNOSIS — Z7901 Long term (current) use of anticoagulants: Secondary | ICD-10-CM | POA: Diagnosis not present

## 2016-12-26 DIAGNOSIS — Z466 Encounter for fitting and adjustment of urinary device: Secondary | ICD-10-CM | POA: Diagnosis not present

## 2016-12-26 DIAGNOSIS — S72002D Fracture of unspecified part of neck of left femur, subsequent encounter for closed fracture with routine healing: Secondary | ICD-10-CM | POA: Diagnosis not present

## 2016-12-26 DIAGNOSIS — S52502D Unspecified fracture of the lower end of left radius, subsequent encounter for closed fracture with routine healing: Secondary | ICD-10-CM | POA: Diagnosis not present

## 2016-12-26 DIAGNOSIS — Z9981 Dependence on supplemental oxygen: Secondary | ICD-10-CM | POA: Diagnosis not present

## 2016-12-26 DIAGNOSIS — F0390 Unspecified dementia without behavioral disturbance: Secondary | ICD-10-CM | POA: Diagnosis not present

## 2016-12-26 DIAGNOSIS — Z9181 History of falling: Secondary | ICD-10-CM | POA: Diagnosis not present

## 2016-12-26 DIAGNOSIS — I10 Essential (primary) hypertension: Secondary | ICD-10-CM | POA: Diagnosis not present

## 2016-12-28 DIAGNOSIS — F0390 Unspecified dementia without behavioral disturbance: Secondary | ICD-10-CM | POA: Diagnosis not present

## 2016-12-28 DIAGNOSIS — I10 Essential (primary) hypertension: Secondary | ICD-10-CM | POA: Diagnosis not present

## 2016-12-28 DIAGNOSIS — S72002D Fracture of unspecified part of neck of left femur, subsequent encounter for closed fracture with routine healing: Secondary | ICD-10-CM | POA: Diagnosis not present

## 2016-12-28 DIAGNOSIS — Z9981 Dependence on supplemental oxygen: Secondary | ICD-10-CM | POA: Diagnosis not present

## 2016-12-28 DIAGNOSIS — Z7901 Long term (current) use of anticoagulants: Secondary | ICD-10-CM | POA: Diagnosis not present

## 2016-12-28 DIAGNOSIS — Z9181 History of falling: Secondary | ICD-10-CM | POA: Diagnosis not present

## 2016-12-28 DIAGNOSIS — S52612D Displaced fracture of left ulna styloid process, subsequent encounter for closed fracture with routine healing: Secondary | ICD-10-CM | POA: Diagnosis not present

## 2016-12-28 DIAGNOSIS — Z466 Encounter for fitting and adjustment of urinary device: Secondary | ICD-10-CM | POA: Diagnosis not present

## 2016-12-28 DIAGNOSIS — S52502D Unspecified fracture of the lower end of left radius, subsequent encounter for closed fracture with routine healing: Secondary | ICD-10-CM | POA: Diagnosis not present

## 2017-01-02 DIAGNOSIS — Z9981 Dependence on supplemental oxygen: Secondary | ICD-10-CM | POA: Diagnosis not present

## 2017-01-02 DIAGNOSIS — I10 Essential (primary) hypertension: Secondary | ICD-10-CM | POA: Diagnosis not present

## 2017-01-02 DIAGNOSIS — S52502D Unspecified fracture of the lower end of left radius, subsequent encounter for closed fracture with routine healing: Secondary | ICD-10-CM | POA: Diagnosis not present

## 2017-01-02 DIAGNOSIS — Z9181 History of falling: Secondary | ICD-10-CM | POA: Diagnosis not present

## 2017-01-02 DIAGNOSIS — F0390 Unspecified dementia without behavioral disturbance: Secondary | ICD-10-CM | POA: Diagnosis not present

## 2017-01-02 DIAGNOSIS — S72002D Fracture of unspecified part of neck of left femur, subsequent encounter for closed fracture with routine healing: Secondary | ICD-10-CM | POA: Diagnosis not present

## 2017-01-02 DIAGNOSIS — Z7901 Long term (current) use of anticoagulants: Secondary | ICD-10-CM | POA: Diagnosis not present

## 2017-01-02 DIAGNOSIS — Z466 Encounter for fitting and adjustment of urinary device: Secondary | ICD-10-CM | POA: Diagnosis not present

## 2017-01-02 DIAGNOSIS — S52612D Displaced fracture of left ulna styloid process, subsequent encounter for closed fracture with routine healing: Secondary | ICD-10-CM | POA: Diagnosis not present

## 2017-01-03 DIAGNOSIS — F0390 Unspecified dementia without behavioral disturbance: Secondary | ICD-10-CM | POA: Diagnosis not present

## 2017-01-03 DIAGNOSIS — S52612D Displaced fracture of left ulna styloid process, subsequent encounter for closed fracture with routine healing: Secondary | ICD-10-CM | POA: Diagnosis not present

## 2017-01-03 DIAGNOSIS — S72002D Fracture of unspecified part of neck of left femur, subsequent encounter for closed fracture with routine healing: Secondary | ICD-10-CM | POA: Diagnosis not present

## 2017-01-03 DIAGNOSIS — Z9181 History of falling: Secondary | ICD-10-CM | POA: Diagnosis not present

## 2017-01-03 DIAGNOSIS — Z7901 Long term (current) use of anticoagulants: Secondary | ICD-10-CM | POA: Diagnosis not present

## 2017-01-03 DIAGNOSIS — S52502D Unspecified fracture of the lower end of left radius, subsequent encounter for closed fracture with routine healing: Secondary | ICD-10-CM | POA: Diagnosis not present

## 2017-01-03 DIAGNOSIS — Z466 Encounter for fitting and adjustment of urinary device: Secondary | ICD-10-CM | POA: Diagnosis not present

## 2017-01-03 DIAGNOSIS — I10 Essential (primary) hypertension: Secondary | ICD-10-CM | POA: Diagnosis not present

## 2017-01-03 DIAGNOSIS — Z9981 Dependence on supplemental oxygen: Secondary | ICD-10-CM | POA: Diagnosis not present

## 2017-01-04 DIAGNOSIS — Z466 Encounter for fitting and adjustment of urinary device: Secondary | ICD-10-CM | POA: Diagnosis not present

## 2017-01-07 DIAGNOSIS — S52502D Unspecified fracture of the lower end of left radius, subsequent encounter for closed fracture with routine healing: Secondary | ICD-10-CM | POA: Diagnosis not present

## 2017-01-07 DIAGNOSIS — S72002D Fracture of unspecified part of neck of left femur, subsequent encounter for closed fracture with routine healing: Secondary | ICD-10-CM | POA: Diagnosis not present

## 2017-01-07 DIAGNOSIS — Z7901 Long term (current) use of anticoagulants: Secondary | ICD-10-CM | POA: Diagnosis not present

## 2017-01-07 DIAGNOSIS — F0390 Unspecified dementia without behavioral disturbance: Secondary | ICD-10-CM | POA: Diagnosis not present

## 2017-01-07 DIAGNOSIS — Z466 Encounter for fitting and adjustment of urinary device: Secondary | ICD-10-CM | POA: Diagnosis not present

## 2017-01-07 DIAGNOSIS — Z9181 History of falling: Secondary | ICD-10-CM | POA: Diagnosis not present

## 2017-01-07 DIAGNOSIS — Z9981 Dependence on supplemental oxygen: Secondary | ICD-10-CM | POA: Diagnosis not present

## 2017-01-07 DIAGNOSIS — I10 Essential (primary) hypertension: Secondary | ICD-10-CM | POA: Diagnosis not present

## 2017-01-07 DIAGNOSIS — S52612D Displaced fracture of left ulna styloid process, subsequent encounter for closed fracture with routine healing: Secondary | ICD-10-CM | POA: Diagnosis not present

## 2017-01-09 DIAGNOSIS — Z9981 Dependence on supplemental oxygen: Secondary | ICD-10-CM | POA: Diagnosis not present

## 2017-01-09 DIAGNOSIS — S52502D Unspecified fracture of the lower end of left radius, subsequent encounter for closed fracture with routine healing: Secondary | ICD-10-CM | POA: Diagnosis not present

## 2017-01-09 DIAGNOSIS — Z466 Encounter for fitting and adjustment of urinary device: Secondary | ICD-10-CM | POA: Diagnosis not present

## 2017-01-09 DIAGNOSIS — I10 Essential (primary) hypertension: Secondary | ICD-10-CM | POA: Diagnosis not present

## 2017-01-09 DIAGNOSIS — S52612D Displaced fracture of left ulna styloid process, subsequent encounter for closed fracture with routine healing: Secondary | ICD-10-CM | POA: Diagnosis not present

## 2017-01-09 DIAGNOSIS — F0390 Unspecified dementia without behavioral disturbance: Secondary | ICD-10-CM | POA: Diagnosis not present

## 2017-01-09 DIAGNOSIS — S72002D Fracture of unspecified part of neck of left femur, subsequent encounter for closed fracture with routine healing: Secondary | ICD-10-CM | POA: Diagnosis not present

## 2017-01-09 DIAGNOSIS — Z7901 Long term (current) use of anticoagulants: Secondary | ICD-10-CM | POA: Diagnosis not present

## 2017-01-09 DIAGNOSIS — Z9181 History of falling: Secondary | ICD-10-CM | POA: Diagnosis not present

## 2017-01-10 DIAGNOSIS — Z4789 Encounter for other orthopedic aftercare: Secondary | ICD-10-CM | POA: Diagnosis not present

## 2017-01-10 DIAGNOSIS — S62102D Fracture of unspecified carpal bone, left wrist, subsequent encounter for fracture with routine healing: Secondary | ICD-10-CM | POA: Diagnosis not present

## 2017-01-10 DIAGNOSIS — S72142D Displaced intertrochanteric fracture of left femur, subsequent encounter for closed fracture with routine healing: Secondary | ICD-10-CM | POA: Diagnosis not present

## 2017-01-11 DIAGNOSIS — Z9181 History of falling: Secondary | ICD-10-CM | POA: Diagnosis not present

## 2017-01-11 DIAGNOSIS — Z9981 Dependence on supplemental oxygen: Secondary | ICD-10-CM | POA: Diagnosis not present

## 2017-01-11 DIAGNOSIS — F0390 Unspecified dementia without behavioral disturbance: Secondary | ICD-10-CM | POA: Diagnosis not present

## 2017-01-11 DIAGNOSIS — Z466 Encounter for fitting and adjustment of urinary device: Secondary | ICD-10-CM | POA: Diagnosis not present

## 2017-01-11 DIAGNOSIS — S52612D Displaced fracture of left ulna styloid process, subsequent encounter for closed fracture with routine healing: Secondary | ICD-10-CM | POA: Diagnosis not present

## 2017-01-11 DIAGNOSIS — I10 Essential (primary) hypertension: Secondary | ICD-10-CM | POA: Diagnosis not present

## 2017-01-11 DIAGNOSIS — Z7901 Long term (current) use of anticoagulants: Secondary | ICD-10-CM | POA: Diagnosis not present

## 2017-01-11 DIAGNOSIS — S52502D Unspecified fracture of the lower end of left radius, subsequent encounter for closed fracture with routine healing: Secondary | ICD-10-CM | POA: Diagnosis not present

## 2017-01-11 DIAGNOSIS — S72002D Fracture of unspecified part of neck of left femur, subsequent encounter for closed fracture with routine healing: Secondary | ICD-10-CM | POA: Diagnosis not present

## 2017-01-12 DIAGNOSIS — Z9181 History of falling: Secondary | ICD-10-CM | POA: Diagnosis not present

## 2017-01-12 DIAGNOSIS — F0390 Unspecified dementia without behavioral disturbance: Secondary | ICD-10-CM | POA: Diagnosis not present

## 2017-01-12 DIAGNOSIS — Z9981 Dependence on supplemental oxygen: Secondary | ICD-10-CM | POA: Diagnosis not present

## 2017-01-12 DIAGNOSIS — Z466 Encounter for fitting and adjustment of urinary device: Secondary | ICD-10-CM | POA: Diagnosis not present

## 2017-01-12 DIAGNOSIS — S72002D Fracture of unspecified part of neck of left femur, subsequent encounter for closed fracture with routine healing: Secondary | ICD-10-CM | POA: Diagnosis not present

## 2017-01-12 DIAGNOSIS — S52502D Unspecified fracture of the lower end of left radius, subsequent encounter for closed fracture with routine healing: Secondary | ICD-10-CM | POA: Diagnosis not present

## 2017-01-12 DIAGNOSIS — I10 Essential (primary) hypertension: Secondary | ICD-10-CM | POA: Diagnosis not present

## 2017-01-12 DIAGNOSIS — Z7901 Long term (current) use of anticoagulants: Secondary | ICD-10-CM | POA: Diagnosis not present

## 2017-01-12 DIAGNOSIS — S52612D Displaced fracture of left ulna styloid process, subsequent encounter for closed fracture with routine healing: Secondary | ICD-10-CM | POA: Diagnosis not present

## 2017-01-14 DIAGNOSIS — Z9981 Dependence on supplemental oxygen: Secondary | ICD-10-CM | POA: Diagnosis not present

## 2017-01-14 DIAGNOSIS — F0390 Unspecified dementia without behavioral disturbance: Secondary | ICD-10-CM | POA: Diagnosis not present

## 2017-01-14 DIAGNOSIS — S52612D Displaced fracture of left ulna styloid process, subsequent encounter for closed fracture with routine healing: Secondary | ICD-10-CM | POA: Diagnosis not present

## 2017-01-14 DIAGNOSIS — I10 Essential (primary) hypertension: Secondary | ICD-10-CM | POA: Diagnosis not present

## 2017-01-14 DIAGNOSIS — S52502D Unspecified fracture of the lower end of left radius, subsequent encounter for closed fracture with routine healing: Secondary | ICD-10-CM | POA: Diagnosis not present

## 2017-01-14 DIAGNOSIS — Z466 Encounter for fitting and adjustment of urinary device: Secondary | ICD-10-CM | POA: Diagnosis not present

## 2017-01-14 DIAGNOSIS — Z7901 Long term (current) use of anticoagulants: Secondary | ICD-10-CM | POA: Diagnosis not present

## 2017-01-14 DIAGNOSIS — Z9181 History of falling: Secondary | ICD-10-CM | POA: Diagnosis not present

## 2017-01-14 DIAGNOSIS — S72002D Fracture of unspecified part of neck of left femur, subsequent encounter for closed fracture with routine healing: Secondary | ICD-10-CM | POA: Diagnosis not present

## 2017-01-15 DIAGNOSIS — S52502D Unspecified fracture of the lower end of left radius, subsequent encounter for closed fracture with routine healing: Secondary | ICD-10-CM | POA: Diagnosis not present

## 2017-01-15 DIAGNOSIS — F0390 Unspecified dementia without behavioral disturbance: Secondary | ICD-10-CM | POA: Diagnosis not present

## 2017-01-15 DIAGNOSIS — Z9981 Dependence on supplemental oxygen: Secondary | ICD-10-CM | POA: Diagnosis not present

## 2017-01-15 DIAGNOSIS — I10 Essential (primary) hypertension: Secondary | ICD-10-CM | POA: Diagnosis not present

## 2017-01-15 DIAGNOSIS — Z9181 History of falling: Secondary | ICD-10-CM | POA: Diagnosis not present

## 2017-01-15 DIAGNOSIS — Z466 Encounter for fitting and adjustment of urinary device: Secondary | ICD-10-CM | POA: Diagnosis not present

## 2017-01-15 DIAGNOSIS — Z7901 Long term (current) use of anticoagulants: Secondary | ICD-10-CM | POA: Diagnosis not present

## 2017-01-15 DIAGNOSIS — S72002D Fracture of unspecified part of neck of left femur, subsequent encounter for closed fracture with routine healing: Secondary | ICD-10-CM | POA: Diagnosis not present

## 2017-01-15 DIAGNOSIS — S52612D Displaced fracture of left ulna styloid process, subsequent encounter for closed fracture with routine healing: Secondary | ICD-10-CM | POA: Diagnosis not present

## 2017-01-17 DIAGNOSIS — S52502D Unspecified fracture of the lower end of left radius, subsequent encounter for closed fracture with routine healing: Secondary | ICD-10-CM | POA: Diagnosis not present

## 2017-01-17 DIAGNOSIS — F0390 Unspecified dementia without behavioral disturbance: Secondary | ICD-10-CM | POA: Diagnosis not present

## 2017-01-17 DIAGNOSIS — Z9181 History of falling: Secondary | ICD-10-CM | POA: Diagnosis not present

## 2017-01-17 DIAGNOSIS — S72002D Fracture of unspecified part of neck of left femur, subsequent encounter for closed fracture with routine healing: Secondary | ICD-10-CM | POA: Diagnosis not present

## 2017-01-17 DIAGNOSIS — S52612D Displaced fracture of left ulna styloid process, subsequent encounter for closed fracture with routine healing: Secondary | ICD-10-CM | POA: Diagnosis not present

## 2017-01-17 DIAGNOSIS — Z7901 Long term (current) use of anticoagulants: Secondary | ICD-10-CM | POA: Diagnosis not present

## 2017-01-17 DIAGNOSIS — Z9981 Dependence on supplemental oxygen: Secondary | ICD-10-CM | POA: Diagnosis not present

## 2017-01-17 DIAGNOSIS — Z466 Encounter for fitting and adjustment of urinary device: Secondary | ICD-10-CM | POA: Diagnosis not present

## 2017-01-17 DIAGNOSIS — I10 Essential (primary) hypertension: Secondary | ICD-10-CM | POA: Diagnosis not present

## 2017-01-21 DIAGNOSIS — Z9981 Dependence on supplemental oxygen: Secondary | ICD-10-CM | POA: Diagnosis not present

## 2017-01-21 DIAGNOSIS — F0390 Unspecified dementia without behavioral disturbance: Secondary | ICD-10-CM | POA: Diagnosis not present

## 2017-01-21 DIAGNOSIS — Z9181 History of falling: Secondary | ICD-10-CM | POA: Diagnosis not present

## 2017-01-21 DIAGNOSIS — S72002D Fracture of unspecified part of neck of left femur, subsequent encounter for closed fracture with routine healing: Secondary | ICD-10-CM | POA: Diagnosis not present

## 2017-01-21 DIAGNOSIS — Z466 Encounter for fitting and adjustment of urinary device: Secondary | ICD-10-CM | POA: Diagnosis not present

## 2017-01-21 DIAGNOSIS — I10 Essential (primary) hypertension: Secondary | ICD-10-CM | POA: Diagnosis not present

## 2017-01-21 DIAGNOSIS — S52502D Unspecified fracture of the lower end of left radius, subsequent encounter for closed fracture with routine healing: Secondary | ICD-10-CM | POA: Diagnosis not present

## 2017-01-21 DIAGNOSIS — S52612D Displaced fracture of left ulna styloid process, subsequent encounter for closed fracture with routine healing: Secondary | ICD-10-CM | POA: Diagnosis not present

## 2017-01-21 DIAGNOSIS — Z7901 Long term (current) use of anticoagulants: Secondary | ICD-10-CM | POA: Diagnosis not present

## 2017-01-22 DIAGNOSIS — I10 Essential (primary) hypertension: Secondary | ICD-10-CM | POA: Diagnosis not present

## 2017-01-22 DIAGNOSIS — Z9181 History of falling: Secondary | ICD-10-CM | POA: Diagnosis not present

## 2017-01-22 DIAGNOSIS — S52612D Displaced fracture of left ulna styloid process, subsequent encounter for closed fracture with routine healing: Secondary | ICD-10-CM | POA: Diagnosis not present

## 2017-01-22 DIAGNOSIS — Z9981 Dependence on supplemental oxygen: Secondary | ICD-10-CM | POA: Diagnosis not present

## 2017-01-22 DIAGNOSIS — Z466 Encounter for fitting and adjustment of urinary device: Secondary | ICD-10-CM | POA: Diagnosis not present

## 2017-01-22 DIAGNOSIS — F0390 Unspecified dementia without behavioral disturbance: Secondary | ICD-10-CM | POA: Diagnosis not present

## 2017-01-22 DIAGNOSIS — S52502D Unspecified fracture of the lower end of left radius, subsequent encounter for closed fracture with routine healing: Secondary | ICD-10-CM | POA: Diagnosis not present

## 2017-01-22 DIAGNOSIS — S72002D Fracture of unspecified part of neck of left femur, subsequent encounter for closed fracture with routine healing: Secondary | ICD-10-CM | POA: Diagnosis not present

## 2017-01-22 DIAGNOSIS — Z7901 Long term (current) use of anticoagulants: Secondary | ICD-10-CM | POA: Diagnosis not present

## 2017-01-24 DIAGNOSIS — Z9181 History of falling: Secondary | ICD-10-CM | POA: Diagnosis not present

## 2017-01-24 DIAGNOSIS — S72002D Fracture of unspecified part of neck of left femur, subsequent encounter for closed fracture with routine healing: Secondary | ICD-10-CM | POA: Diagnosis not present

## 2017-01-24 DIAGNOSIS — S52502D Unspecified fracture of the lower end of left radius, subsequent encounter for closed fracture with routine healing: Secondary | ICD-10-CM | POA: Diagnosis not present

## 2017-01-24 DIAGNOSIS — Z466 Encounter for fitting and adjustment of urinary device: Secondary | ICD-10-CM | POA: Diagnosis not present

## 2017-01-24 DIAGNOSIS — F0390 Unspecified dementia without behavioral disturbance: Secondary | ICD-10-CM | POA: Diagnosis not present

## 2017-01-24 DIAGNOSIS — Z9981 Dependence on supplemental oxygen: Secondary | ICD-10-CM | POA: Diagnosis not present

## 2017-01-24 DIAGNOSIS — S52612D Displaced fracture of left ulna styloid process, subsequent encounter for closed fracture with routine healing: Secondary | ICD-10-CM | POA: Diagnosis not present

## 2017-01-24 DIAGNOSIS — Z7901 Long term (current) use of anticoagulants: Secondary | ICD-10-CM | POA: Diagnosis not present

## 2017-01-24 DIAGNOSIS — I10 Essential (primary) hypertension: Secondary | ICD-10-CM | POA: Diagnosis not present

## 2017-01-25 DIAGNOSIS — Z9181 History of falling: Secondary | ICD-10-CM | POA: Diagnosis not present

## 2017-01-25 DIAGNOSIS — S52612D Displaced fracture of left ulna styloid process, subsequent encounter for closed fracture with routine healing: Secondary | ICD-10-CM | POA: Diagnosis not present

## 2017-01-25 DIAGNOSIS — Z9981 Dependence on supplemental oxygen: Secondary | ICD-10-CM | POA: Diagnosis not present

## 2017-01-25 DIAGNOSIS — I10 Essential (primary) hypertension: Secondary | ICD-10-CM | POA: Diagnosis not present

## 2017-01-25 DIAGNOSIS — S52502D Unspecified fracture of the lower end of left radius, subsequent encounter for closed fracture with routine healing: Secondary | ICD-10-CM | POA: Diagnosis not present

## 2017-01-25 DIAGNOSIS — Z466 Encounter for fitting and adjustment of urinary device: Secondary | ICD-10-CM | POA: Diagnosis not present

## 2017-01-25 DIAGNOSIS — S72002D Fracture of unspecified part of neck of left femur, subsequent encounter for closed fracture with routine healing: Secondary | ICD-10-CM | POA: Diagnosis not present

## 2017-01-25 DIAGNOSIS — Z7901 Long term (current) use of anticoagulants: Secondary | ICD-10-CM | POA: Diagnosis not present

## 2017-01-25 DIAGNOSIS — F0390 Unspecified dementia without behavioral disturbance: Secondary | ICD-10-CM | POA: Diagnosis not present

## 2017-01-26 DIAGNOSIS — Z7901 Long term (current) use of anticoagulants: Secondary | ICD-10-CM | POA: Diagnosis not present

## 2017-01-26 DIAGNOSIS — Z9181 History of falling: Secondary | ICD-10-CM | POA: Diagnosis not present

## 2017-01-26 DIAGNOSIS — S52502D Unspecified fracture of the lower end of left radius, subsequent encounter for closed fracture with routine healing: Secondary | ICD-10-CM | POA: Diagnosis not present

## 2017-01-26 DIAGNOSIS — F0390 Unspecified dementia without behavioral disturbance: Secondary | ICD-10-CM | POA: Diagnosis not present

## 2017-01-26 DIAGNOSIS — S72002D Fracture of unspecified part of neck of left femur, subsequent encounter for closed fracture with routine healing: Secondary | ICD-10-CM | POA: Diagnosis not present

## 2017-01-26 DIAGNOSIS — I10 Essential (primary) hypertension: Secondary | ICD-10-CM | POA: Diagnosis not present

## 2017-01-26 DIAGNOSIS — Z466 Encounter for fitting and adjustment of urinary device: Secondary | ICD-10-CM | POA: Diagnosis not present

## 2017-01-26 DIAGNOSIS — Z9981 Dependence on supplemental oxygen: Secondary | ICD-10-CM | POA: Diagnosis not present

## 2017-01-26 DIAGNOSIS — S52612D Displaced fracture of left ulna styloid process, subsequent encounter for closed fracture with routine healing: Secondary | ICD-10-CM | POA: Diagnosis not present

## 2017-01-28 DIAGNOSIS — Z9181 History of falling: Secondary | ICD-10-CM | POA: Diagnosis not present

## 2017-01-28 DIAGNOSIS — I10 Essential (primary) hypertension: Secondary | ICD-10-CM | POA: Diagnosis not present

## 2017-01-28 DIAGNOSIS — S72002D Fracture of unspecified part of neck of left femur, subsequent encounter for closed fracture with routine healing: Secondary | ICD-10-CM | POA: Diagnosis not present

## 2017-01-28 DIAGNOSIS — Z9981 Dependence on supplemental oxygen: Secondary | ICD-10-CM | POA: Diagnosis not present

## 2017-01-28 DIAGNOSIS — F0390 Unspecified dementia without behavioral disturbance: Secondary | ICD-10-CM | POA: Diagnosis not present

## 2017-01-28 DIAGNOSIS — Z7901 Long term (current) use of anticoagulants: Secondary | ICD-10-CM | POA: Diagnosis not present

## 2017-01-28 DIAGNOSIS — S52612D Displaced fracture of left ulna styloid process, subsequent encounter for closed fracture with routine healing: Secondary | ICD-10-CM | POA: Diagnosis not present

## 2017-01-28 DIAGNOSIS — Z466 Encounter for fitting and adjustment of urinary device: Secondary | ICD-10-CM | POA: Diagnosis not present

## 2017-01-28 DIAGNOSIS — S52502D Unspecified fracture of the lower end of left radius, subsequent encounter for closed fracture with routine healing: Secondary | ICD-10-CM | POA: Diagnosis not present

## 2017-01-30 ENCOUNTER — Ambulatory Visit: Payer: Medicare HMO | Admitting: Family Medicine

## 2017-01-30 DIAGNOSIS — S52612D Displaced fracture of left ulna styloid process, subsequent encounter for closed fracture with routine healing: Secondary | ICD-10-CM | POA: Diagnosis not present

## 2017-01-30 DIAGNOSIS — Z9981 Dependence on supplemental oxygen: Secondary | ICD-10-CM | POA: Diagnosis not present

## 2017-01-30 DIAGNOSIS — F0390 Unspecified dementia without behavioral disturbance: Secondary | ICD-10-CM | POA: Diagnosis not present

## 2017-01-30 DIAGNOSIS — Z7901 Long term (current) use of anticoagulants: Secondary | ICD-10-CM | POA: Diagnosis not present

## 2017-01-30 DIAGNOSIS — S52502D Unspecified fracture of the lower end of left radius, subsequent encounter for closed fracture with routine healing: Secondary | ICD-10-CM | POA: Diagnosis not present

## 2017-01-30 DIAGNOSIS — S72002D Fracture of unspecified part of neck of left femur, subsequent encounter for closed fracture with routine healing: Secondary | ICD-10-CM | POA: Diagnosis not present

## 2017-01-30 DIAGNOSIS — Z9181 History of falling: Secondary | ICD-10-CM | POA: Diagnosis not present

## 2017-01-30 DIAGNOSIS — I10 Essential (primary) hypertension: Secondary | ICD-10-CM | POA: Diagnosis not present

## 2017-01-30 DIAGNOSIS — Z466 Encounter for fitting and adjustment of urinary device: Secondary | ICD-10-CM | POA: Diagnosis not present

## 2017-01-31 ENCOUNTER — Encounter: Payer: Self-pay | Admitting: Family Medicine

## 2017-01-31 ENCOUNTER — Ambulatory Visit (INDEPENDENT_AMBULATORY_CARE_PROVIDER_SITE_OTHER): Payer: Medicare HMO | Admitting: Family Medicine

## 2017-01-31 VITALS — BP 116/74 | HR 68 | Temp 96.7°F | Ht 62.0 in | Wt 146.0 lb

## 2017-01-31 DIAGNOSIS — G309 Alzheimer's disease, unspecified: Secondary | ICD-10-CM

## 2017-01-31 DIAGNOSIS — E7800 Pure hypercholesterolemia, unspecified: Secondary | ICD-10-CM | POA: Diagnosis not present

## 2017-01-31 DIAGNOSIS — Z466 Encounter for fitting and adjustment of urinary device: Secondary | ICD-10-CM | POA: Diagnosis not present

## 2017-01-31 DIAGNOSIS — E559 Vitamin D deficiency, unspecified: Secondary | ICD-10-CM

## 2017-01-31 DIAGNOSIS — F0390 Unspecified dementia without behavioral disturbance: Secondary | ICD-10-CM | POA: Diagnosis not present

## 2017-01-31 DIAGNOSIS — I10 Essential (primary) hypertension: Secondary | ICD-10-CM

## 2017-01-31 DIAGNOSIS — S52502D Unspecified fracture of the lower end of left radius, subsequent encounter for closed fracture with routine healing: Secondary | ICD-10-CM | POA: Diagnosis not present

## 2017-01-31 DIAGNOSIS — S72002D Fracture of unspecified part of neck of left femur, subsequent encounter for closed fracture with routine healing: Secondary | ICD-10-CM | POA: Diagnosis not present

## 2017-01-31 DIAGNOSIS — R413 Other amnesia: Secondary | ICD-10-CM

## 2017-01-31 DIAGNOSIS — Z978 Presence of other specified devices: Secondary | ICD-10-CM

## 2017-01-31 DIAGNOSIS — Z9289 Personal history of other medical treatment: Secondary | ICD-10-CM | POA: Diagnosis not present

## 2017-01-31 DIAGNOSIS — Z7901 Long term (current) use of anticoagulants: Secondary | ICD-10-CM | POA: Diagnosis not present

## 2017-01-31 DIAGNOSIS — Z960 Presence of urogenital implants: Secondary | ICD-10-CM

## 2017-01-31 DIAGNOSIS — Z9981 Dependence on supplemental oxygen: Secondary | ICD-10-CM | POA: Diagnosis not present

## 2017-01-31 DIAGNOSIS — I4891 Unspecified atrial fibrillation: Secondary | ICD-10-CM | POA: Diagnosis not present

## 2017-01-31 DIAGNOSIS — Z9181 History of falling: Secondary | ICD-10-CM | POA: Diagnosis not present

## 2017-01-31 DIAGNOSIS — F0280 Dementia in other diseases classified elsewhere without behavioral disturbance: Secondary | ICD-10-CM | POA: Diagnosis not present

## 2017-01-31 DIAGNOSIS — S52612D Displaced fracture of left ulna styloid process, subsequent encounter for closed fracture with routine healing: Secondary | ICD-10-CM | POA: Diagnosis not present

## 2017-01-31 NOTE — Progress Notes (Signed)
Subjective:    Patient ID: Vickie Hall, female    DOB: Mar 14, 1928, 83 y.o.   MRN: [id number]  HPI Pt here for follow up and management of chronic medical problems which includes hypertension, a fib, and hyperlipidemia. She is taking medication regularly. The patient had a fall in February and was taken to the emergency room where it was found she had a closed fracture of the left hip and a left wrist fracture. She has an indwelling catheter and it has been leaking despite that of fact that he has been changed on multiple times. We will try to get her in to see the urologist sooner. The patient is fairly alert and does know my name. She is still pretty much confined to a wheelchair as she is not able to walk by herself without assistance but is still getting physical therapy. She has indwelling catheter and we've made arrangements for her to see the urologist on May 24. She denies any chest pain or shortness of breath. She denies any trouble with her intestinal track including nausea vomiting or diarrhea.     Patient Active Problem List   Diagnosis Date Noted  . Foley catheter in place 11/12/2016  . Closed pertrochanteric fracture of femur, left, initial encounter ([place]) 10/29/2016  . Closed left hip fracture ([place]) 10/28/2016  . Dementia due to Alzheimer's disease 10/28/2016  . Aortic atherosclerosis ([place]) 05/29/2016  . Osteoporosis, senile 12/16/2013  . GERD (gastroesophageal reflux disease) 11/11/2013  . HTN (hypertension) 11/11/2013  . Hyperlipidemia 11/11/2013   Outpatient Encounter Prescriptions as of 01/31/2017  Medication Sig  . acetaminophen (TYLENOL) 325 MG tablet Take 2 tablets (650 mg total) by mouth every 6 (six) hours as needed for mild pain (or Fever >/= 101).  Marland Kitchen alum & mag hydroxide-simeth (MAALOX/MYLANTA) 200-200-20 MG/5ML suspension Take 30 mLs by mouth as needed for indigestion or heartburn.  . ezetimibe (ZETIA) 10 MG tablet Take 10 mg by mouth daily.  . feeding  supplement, ENSURE ENLIVE, (ENSURE ENLIVE) LIQD Take 237 mLs by mouth 2 (two) times daily between meals.  . ferrous sulfate 325 (65 FE) MG tablet Take 1 tablet (325 mg total) by mouth daily with breakfast.  . HYDROcodone-acetaminophen (NORCO/VICODIN) 5-325 MG tablet Take 1 tablet by mouth every 6 (six) hours as needed for moderate pain.  . Memantine HCl-Donepezil HCl (NAMZARIC) 28-10 MG CP24 Take 1 tablet by mouth at bedtime.  . senna-docusate (SENOKOT-S) 8.6-50 MG tablet Take 2 tablets by mouth 2 (two) times daily.  . valsartan (DIOVAN) 160 MG tablet   . Vitamin D, Ergocalciferol, (DRISDOL) 50000 UNITS CAPS capsule Take 1 capsule (50,000 Units total) by mouth every 7 (seven) days.  . [DISCONTINUED] cephALEXin (KEFLEX) 500 MG capsule Take 1 capsule (500 mg total) by mouth 3 (three) times daily.  . [DISCONTINUED] apixaban (ELIQUIS) 2.5 MG TABS tablet Take 1 tablet (2.5 mg total) by mouth 2 (two) times daily. Please take for 21 days for DVT prophylaxis and then stop   No facility-administered encounter medications on file as of 01/31/2017.       Review of Systems  Constitutional: Negative.   HENT: Negative.   Eyes: Negative.   Respiratory: Negative.   Cardiovascular: Negative.   Gastrointestinal: Negative.   Endocrine: Negative.   Genitourinary: Negative.        Foley cath - in place - leaking  Musculoskeletal: Negative.   Skin: Negative.   Allergic/Immunologic: Negative.   Neurological: Negative.   Hematological: Negative.   Psychiatric/Behavioral: Negative.  Objective:   Physical Exam  Constitutional: She is oriented to person, place, and time. No distress.  The patient is elderly but calm and not agitated.  HENT:  Head: Normocephalic and atraumatic.  Nose: Nose normal.  Mouth/Throat: Oropharynx is clear and moist. No oropharyngeal exudate.  Bilateral ears cerumen will be irrigated by the nurse before she leaves office.  Eyes: Conjunctivae and EOM are normal. Pupils are  equal, round, and reactive to light. Right eye exhibits no discharge. Left eye exhibits no discharge. No scleral icterus.  Neck: Normal range of motion. Neck supple. No thyromegaly present.  Cardiovascular: Normal rate and regular rhythm.   No murmur heard. Heart was regular today at 72/m  Pulmonary/Chest: Effort normal and breath sounds normal. No respiratory distress. She has no wheezes. She has no rales.  Clear anteriorly and posteriorly  Abdominal: Soft. Bowel sounds are normal. She exhibits no mass. There is no tenderness. There is no rebound and no guarding.  No abdominal tenderness or masses detected by exam  Genitourinary:  Genitourinary Comments: The patient does have an indwelling catheter.  Musculoskeletal: She exhibits no edema.  The patient is currently unable to stand without assistance. She comes to the visit today in a wheelchair. She has good strength in both upper extremities with minimal swelling remaining in the left wrist. She she has good mobility of both lower hips with the left being slightly less mobile than the right. But there was no pain with movement with abduction or abduction. There is no edema.  Lymphadenopathy:    She has no cervical adenopathy.  Neurological: She is alert and oriented to person, place, and time. She has normal reflexes. No cranial nerve deficit.  Skin: Skin is warm and dry. No rash noted.  Psychiatric: She has a normal mood and affect. Her behavior is normal. Thought content normal.  Nursing note and vitals reviewed.  BP 116/74 (BP Location: Left Arm)   Pulse 68   Temp (!) 96.7 F (35.9 C) (Oral)   Ht _0  (1.575 m)   Wt 146 lb (66.2 kg)   BMI 26.70 kg/m         Assessment & Plan:  1. Essential hypertension The blood pressure is good today and she will continue with current treatment - BMP8+EGFR - CBC with Differential/Platelet - Hepatic function panel  2. Foley catheter in place -Arrangements will be made with the urologist  to see them sooner rather than later because of the constant leaking she is having around the current catheter. - CBC with Differential/Platelet  3. Dementia due to Alzheimer's disease -The patient's dementia appears stable without any worsening. - CBC with Differential/Platelet  4. Atrial fibrillation, unspecified type ([place]) -The heart today appeared to be regular in rhythm at 72/m - CBC with Differential/Platelet  5. Vitamin D deficiency -Continue current treatment pending results of lab work - CBC with Differential/Platelet - VITAMIN D 25 Hydroxy (Vit-D Deficiency, Fractures)  6. Memory deficit -The patient's dementia appears to be stable and not significantly worse than when she was seen at the last visit - CBC with Differential/Platelet  7. Pure hypercholesterolemia -[place]inny with therapeutic lifestyle changes and current treatment pending results of lab work - CBC with Differential/Platelet  8. Status post closed left hip fracture with physical therapy ongoing -Continue with physical therapy - Lipid panel  Patient Instructions                       Medicare Annual Wellness  Visit  [place] and the medical providers at [facility] strive to bring you the best medical care.  In doing so we not only want to address your current medical conditions and concerns but also to detect new conditions early and prevent illness, disease and health-related problems.    Medicare offers a yearly Wellness Visit which allows our clinical staff to assess your need for preventative services including immunizations, lifestyle education, counseling to decrease risk of preventable diseases and screening for fall risk and other medical concerns.    This visit is provided free of charge (no copay) for all Medicare recipients. The clinical pharmacists at [facility] have begun to conduct these Wellness Visits which will also include a thorough review of all  your medications.    As you primary medical provider recommend that you make an appointment for your Annual Wellness Visit if you have not done so already this year.  You may set up this appointment before you leave today or you may call back ([phone]) and schedule an appointment.  Please make sure when you call that you mention that you are scheduling your Annual Wellness Visit with the clinical pharmacist so that the appointment may be made for the proper length of time.    Continue current medications. Continue good therapeutic lifestyle changes which include good diet and exercise. Fall precautions discussed with patient. If an FOBT was given today- please return it to our front desk. If you are over 10 years old - you may need Prevnar 25 or the adult Pneumonia vaccine.  **Flu shots are available--- please call and schedule a FLU-CLINIC appointment**  After your visit with [place] today you will receive a survey in the mail or online from [employer] regarding your care with [place]. Please take a moment to fill this out. Your feedback is very important to [place] as you can help [place] better understand your patient needs as well as improve your experience and satisfaction. WE CARE ABOUT YOU!!!  Continue with physical therapy and gait strengthening Keep appointment with urologist for catheter adjustment and hopefully removal Follow-up with orthopedic as planned Use Debrox eardrops over-the-counter periodically as needed to soften earwax so that it will come out on its own.   Arrie Senate MD

## 2017-01-31 NOTE — Patient Instructions (Addendum)
Medicare Annual Wellness Visit  [facility] and the medical providers at [facility] strive to bring you the best medical care.  In doing so we not only want to address your current medical conditions and concerns but also to detect new conditions early and prevent illness, disease and health-related problems.    Medicare offers a yearly Wellness Visit which allows our clinical staff to assess your need for preventative services including immunizations, lifestyle education, counseling to decrease risk of preventable diseases and screening for fall risk and other medical concerns.    This visit is provided free of charge (no copay) for all Medicare recipients. The clinical pharmacists at [facility] have begun to conduct these Wellness Visits which will also include a thorough review of all your medications.    As you primary medical provider recommend that you make an appointment for your Annual Wellness Visit if you have not done so already this year.  You may set up this appointment before you leave today or you may call back [phone] and schedule an appointment.  Please make sure when you call that you mention that you are scheduling your Annual Wellness Visit with the clinical pharmacist so that the appointment may be made for the proper length of time.    Continue current medications. Continue good therapeutic lifestyle changes which include good diet and exercise. Fall precautions discussed with patient. If an FOBT was given today- please return it to our front desk. If you are over 81 years old - you may need Prevnar 13 or the adult Pneumonia vaccine.  **Flu shots are available--- please call and schedule a FLU-CLINIC appointment**  After your visit with us today you will receive a survey in the mail or online from [employer] regarding your care with us. Please take a moment to fill this out. Your feedback is very  important to us as you can help us better understand your patient needs as well as improve your experience and satisfaction. WE CARE ABOUT YOU!!!  Continue with physical therapy and gait strengthening Keep appointment with urologist for catheter adjustment and hopefully removal Follow-up with orthopedic as planned Use Debrox eardrops over-the-counter periodically as needed to soften earwax so that it will come out on its own.

## 2017-02-01 LAB — CBC WITH DIFFERENTIAL/PLATELET
BASOS: 0 %
Basophils Absolute: 0 10*3/uL (ref 0.0–0.2)
EOS (ABSOLUTE): 0.1 10*3/uL (ref 0.0–0.4)
EOS: 0 %
HEMOGLOBIN: 12.7 g/dL (ref 11.1–15.9)
Hematocrit: 39.2 % (ref 34.0–46.6)
IMMATURE GRANS (ABS): 0 10*3/uL (ref 0.0–0.1)
IMMATURE GRANULOCYTES: 0 %
LYMPHS: 19 %
Lymphocytes Absolute: 2.2 10*3/uL (ref 0.7–3.1)
MCH: 29.5 pg (ref 26.6–33.0)
MCHC: 32.4 g/dL (ref 31.5–35.7)
MCV: 91 fL (ref 79–97)
Monocytes Absolute: 0.9 10*3/uL (ref 0.1–0.9)
Monocytes: 7 %
NEUTROS ABS: 8.8 10*3/uL — AB (ref 1.4–7.0)
NEUTROS PCT: 74 %
PLATELETS: 374 10*3/uL (ref 150–379)
RBC: 4.3 x10E6/uL (ref 3.77–5.28)
RDW: 14.9 % (ref 12.3–15.4)
WBC: 12 10*3/uL — ABNORMAL HIGH (ref 3.4–10.8)

## 2017-02-01 LAB — LIPID PANEL
CHOL/HDL RATIO: 4.3 ratio (ref 0.0–4.4)
Cholesterol, Total: 211 mg/dL — ABNORMAL HIGH (ref 100–199)
HDL: 49 mg/dL (ref >39)
LDL CALC: 132 mg/dL — AB (ref 0–99)
Triglycerides: 152 mg/dL — ABNORMAL HIGH (ref 0–149)
VLDL CHOLESTEROL CAL: 30 mg/dL (ref 5–40)

## 2017-02-01 LAB — BMP8+EGFR
BUN/Creatinine Ratio: 14 (ref 12–28)
BUN: 14 mg/dL (ref 8–27)
CALCIUM: 9.4 mg/dL (ref 8.7–10.3)
CO2: 28 mmol/L (ref 18–29)
CREATININE: 0.97 mg/dL (ref 0.57–1.00)
Chloride: 102 mmol/L (ref 96–106)
GFR, EST AFRICAN AMERICAN: 61 mL/min/{1.73_m2} (ref >59)
GFR, EST NON AFRICAN AMERICAN: 53 mL/min/{1.73_m2} — AB (ref >59)
Glucose: 110 mg/dL — ABNORMAL HIGH (ref 65–99)
POTASSIUM: 3.6 mmol/L (ref 3.5–5.2)
Sodium: 143 mmol/L (ref 134–144)

## 2017-02-01 LAB — VITAMIN D 25 HYDROXY (VIT D DEFICIENCY, FRACTURES): VIT D 25 HYDROXY: 29.6 ng/mL — AB (ref 30.0–100.0)

## 2017-02-01 LAB — HEPATIC FUNCTION PANEL
ALBUMIN: 4.1 g/dL (ref 3.5–4.7)
ALT: 10 IU/L (ref 0–32)
AST: 12 IU/L (ref 0–40)
Alkaline Phosphatase: 93 IU/L (ref 39–117)
Bilirubin Total: 0.4 mg/dL (ref 0.0–1.2)
Bilirubin, Direct: 0.1 mg/dL (ref 0.00–0.40)
Total Protein: 6.8 g/dL (ref 6.0–8.5)

## 2017-02-04 DIAGNOSIS — I10 Essential (primary) hypertension: Secondary | ICD-10-CM | POA: Diagnosis not present

## 2017-02-04 DIAGNOSIS — Z9181 History of falling: Secondary | ICD-10-CM | POA: Diagnosis not present

## 2017-02-04 DIAGNOSIS — S52612D Displaced fracture of left ulna styloid process, subsequent encounter for closed fracture with routine healing: Secondary | ICD-10-CM | POA: Diagnosis not present

## 2017-02-04 DIAGNOSIS — Z466 Encounter for fitting and adjustment of urinary device: Secondary | ICD-10-CM | POA: Diagnosis not present

## 2017-02-04 DIAGNOSIS — S72002D Fracture of unspecified part of neck of left femur, subsequent encounter for closed fracture with routine healing: Secondary | ICD-10-CM | POA: Diagnosis not present

## 2017-02-04 DIAGNOSIS — S52502D Unspecified fracture of the lower end of left radius, subsequent encounter for closed fracture with routine healing: Secondary | ICD-10-CM | POA: Diagnosis not present

## 2017-02-04 DIAGNOSIS — Z7901 Long term (current) use of anticoagulants: Secondary | ICD-10-CM | POA: Diagnosis not present

## 2017-02-04 DIAGNOSIS — F0390 Unspecified dementia without behavioral disturbance: Secondary | ICD-10-CM | POA: Diagnosis not present

## 2017-02-04 DIAGNOSIS — Z9981 Dependence on supplemental oxygen: Secondary | ICD-10-CM | POA: Diagnosis not present

## 2017-02-06 DIAGNOSIS — Z961 Presence of intraocular lens: Secondary | ICD-10-CM | POA: Diagnosis not present

## 2017-02-06 DIAGNOSIS — F0390 Unspecified dementia without behavioral disturbance: Secondary | ICD-10-CM | POA: Diagnosis not present

## 2017-02-06 DIAGNOSIS — H43812 Vitreous degeneration, left eye: Secondary | ICD-10-CM | POA: Diagnosis not present

## 2017-02-06 DIAGNOSIS — Z466 Encounter for fitting and adjustment of urinary device: Secondary | ICD-10-CM | POA: Diagnosis not present

## 2017-02-06 DIAGNOSIS — S72002D Fracture of unspecified part of neck of left femur, subsequent encounter for closed fracture with routine healing: Secondary | ICD-10-CM | POA: Diagnosis not present

## 2017-02-06 DIAGNOSIS — Z9981 Dependence on supplemental oxygen: Secondary | ICD-10-CM | POA: Diagnosis not present

## 2017-02-06 DIAGNOSIS — Z7901 Long term (current) use of anticoagulants: Secondary | ICD-10-CM | POA: Diagnosis not present

## 2017-02-06 DIAGNOSIS — H26492 Other secondary cataract, left eye: Secondary | ICD-10-CM | POA: Diagnosis not present

## 2017-02-06 DIAGNOSIS — I10 Essential (primary) hypertension: Secondary | ICD-10-CM | POA: Diagnosis not present

## 2017-02-06 DIAGNOSIS — Z9181 History of falling: Secondary | ICD-10-CM | POA: Diagnosis not present

## 2017-02-06 DIAGNOSIS — S52502D Unspecified fracture of the lower end of left radius, subsequent encounter for closed fracture with routine healing: Secondary | ICD-10-CM | POA: Diagnosis not present

## 2017-02-06 DIAGNOSIS — H3500 Unspecified background retinopathy: Secondary | ICD-10-CM | POA: Diagnosis not present

## 2017-02-06 DIAGNOSIS — S52612D Displaced fracture of left ulna styloid process, subsequent encounter for closed fracture with routine healing: Secondary | ICD-10-CM | POA: Diagnosis not present

## 2017-02-07 DIAGNOSIS — R339 Retention of urine, unspecified: Secondary | ICD-10-CM | POA: Diagnosis not present

## 2017-02-08 DIAGNOSIS — S52612D Displaced fracture of left ulna styloid process, subsequent encounter for closed fracture with routine healing: Secondary | ICD-10-CM | POA: Diagnosis not present

## 2017-02-08 DIAGNOSIS — Z9181 History of falling: Secondary | ICD-10-CM | POA: Diagnosis not present

## 2017-02-08 DIAGNOSIS — I10 Essential (primary) hypertension: Secondary | ICD-10-CM | POA: Diagnosis not present

## 2017-02-08 DIAGNOSIS — Z466 Encounter for fitting and adjustment of urinary device: Secondary | ICD-10-CM | POA: Diagnosis not present

## 2017-02-08 DIAGNOSIS — Z9981 Dependence on supplemental oxygen: Secondary | ICD-10-CM | POA: Diagnosis not present

## 2017-02-08 DIAGNOSIS — S72002D Fracture of unspecified part of neck of left femur, subsequent encounter for closed fracture with routine healing: Secondary | ICD-10-CM | POA: Diagnosis not present

## 2017-02-08 DIAGNOSIS — F0390 Unspecified dementia without behavioral disturbance: Secondary | ICD-10-CM | POA: Diagnosis not present

## 2017-02-08 DIAGNOSIS — Z7901 Long term (current) use of anticoagulants: Secondary | ICD-10-CM | POA: Diagnosis not present

## 2017-02-08 DIAGNOSIS — S52502D Unspecified fracture of the lower end of left radius, subsequent encounter for closed fracture with routine healing: Secondary | ICD-10-CM | POA: Diagnosis not present

## 2017-02-12 DIAGNOSIS — S52612D Displaced fracture of left ulna styloid process, subsequent encounter for closed fracture with routine healing: Secondary | ICD-10-CM | POA: Diagnosis not present

## 2017-02-12 DIAGNOSIS — Z7901 Long term (current) use of anticoagulants: Secondary | ICD-10-CM | POA: Diagnosis not present

## 2017-02-12 DIAGNOSIS — S72002D Fracture of unspecified part of neck of left femur, subsequent encounter for closed fracture with routine healing: Secondary | ICD-10-CM | POA: Diagnosis not present

## 2017-02-12 DIAGNOSIS — Z466 Encounter for fitting and adjustment of urinary device: Secondary | ICD-10-CM | POA: Diagnosis not present

## 2017-02-12 DIAGNOSIS — Z9181 History of falling: Secondary | ICD-10-CM | POA: Diagnosis not present

## 2017-02-12 DIAGNOSIS — Z9981 Dependence on supplemental oxygen: Secondary | ICD-10-CM | POA: Diagnosis not present

## 2017-02-12 DIAGNOSIS — I10 Essential (primary) hypertension: Secondary | ICD-10-CM | POA: Diagnosis not present

## 2017-02-12 DIAGNOSIS — S52502D Unspecified fracture of the lower end of left radius, subsequent encounter for closed fracture with routine healing: Secondary | ICD-10-CM | POA: Diagnosis not present

## 2017-02-12 DIAGNOSIS — F0390 Unspecified dementia without behavioral disturbance: Secondary | ICD-10-CM | POA: Diagnosis not present

## 2017-02-14 ENCOUNTER — Encounter (INDEPENDENT_AMBULATORY_CARE_PROVIDER_SITE_OTHER): Payer: Medicare HMO | Admitting: Family Medicine

## 2017-02-14 DIAGNOSIS — S72002D Fracture of unspecified part of neck of left femur, subsequent encounter for closed fracture with routine healing: Secondary | ICD-10-CM | POA: Diagnosis not present

## 2017-02-14 DIAGNOSIS — S52502D Unspecified fracture of the lower end of left radius, subsequent encounter for closed fracture with routine healing: Secondary | ICD-10-CM

## 2017-02-14 DIAGNOSIS — R8279 Other abnormal findings on microbiological examination of urine: Secondary | ICD-10-CM | POA: Diagnosis not present

## 2017-02-14 DIAGNOSIS — S52612D Displaced fracture of left ulna styloid process, subsequent encounter for closed fracture with routine healing: Secondary | ICD-10-CM

## 2017-02-14 DIAGNOSIS — Z466 Encounter for fitting and adjustment of urinary device: Secondary | ICD-10-CM

## 2017-02-14 DIAGNOSIS — M6281 Muscle weakness (generalized): Secondary | ICD-10-CM | POA: Diagnosis not present

## 2017-02-14 DIAGNOSIS — Z9981 Dependence on supplemental oxygen: Secondary | ICD-10-CM

## 2017-02-14 DIAGNOSIS — F0390 Unspecified dementia without behavioral disturbance: Secondary | ICD-10-CM

## 2017-02-14 DIAGNOSIS — I10 Essential (primary) hypertension: Secondary | ICD-10-CM | POA: Diagnosis not present

## 2017-02-14 DIAGNOSIS — Z7901 Long term (current) use of anticoagulants: Secondary | ICD-10-CM

## 2017-02-14 DIAGNOSIS — Z9181 History of falling: Secondary | ICD-10-CM | POA: Diagnosis not present

## 2017-02-14 DIAGNOSIS — R338 Other retention of urine: Secondary | ICD-10-CM | POA: Diagnosis not present

## 2017-02-15 DIAGNOSIS — M6281 Muscle weakness (generalized): Secondary | ICD-10-CM | POA: Diagnosis not present

## 2017-02-15 DIAGNOSIS — Z9181 History of falling: Secondary | ICD-10-CM | POA: Diagnosis not present

## 2017-02-15 DIAGNOSIS — S52502D Unspecified fracture of the lower end of left radius, subsequent encounter for closed fracture with routine healing: Secondary | ICD-10-CM | POA: Diagnosis not present

## 2017-02-15 DIAGNOSIS — F0390 Unspecified dementia without behavioral disturbance: Secondary | ICD-10-CM | POA: Diagnosis not present

## 2017-02-15 DIAGNOSIS — I10 Essential (primary) hypertension: Secondary | ICD-10-CM | POA: Diagnosis not present

## 2017-02-15 DIAGNOSIS — Z9981 Dependence on supplemental oxygen: Secondary | ICD-10-CM | POA: Diagnosis not present

## 2017-02-15 DIAGNOSIS — Z7901 Long term (current) use of anticoagulants: Secondary | ICD-10-CM | POA: Diagnosis not present

## 2017-02-15 DIAGNOSIS — S52612D Displaced fracture of left ulna styloid process, subsequent encounter for closed fracture with routine healing: Secondary | ICD-10-CM | POA: Diagnosis not present

## 2017-02-15 DIAGNOSIS — S72002D Fracture of unspecified part of neck of left femur, subsequent encounter for closed fracture with routine healing: Secondary | ICD-10-CM | POA: Diagnosis not present

## 2017-02-18 DIAGNOSIS — S52502D Unspecified fracture of the lower end of left radius, subsequent encounter for closed fracture with routine healing: Secondary | ICD-10-CM | POA: Diagnosis not present

## 2017-02-18 DIAGNOSIS — Z7901 Long term (current) use of anticoagulants: Secondary | ICD-10-CM | POA: Diagnosis not present

## 2017-02-18 DIAGNOSIS — Z9981 Dependence on supplemental oxygen: Secondary | ICD-10-CM | POA: Diagnosis not present

## 2017-02-18 DIAGNOSIS — S52612D Displaced fracture of left ulna styloid process, subsequent encounter for closed fracture with routine healing: Secondary | ICD-10-CM | POA: Diagnosis not present

## 2017-02-18 DIAGNOSIS — F0390 Unspecified dementia without behavioral disturbance: Secondary | ICD-10-CM | POA: Diagnosis not present

## 2017-02-18 DIAGNOSIS — I10 Essential (primary) hypertension: Secondary | ICD-10-CM | POA: Diagnosis not present

## 2017-02-18 DIAGNOSIS — M6281 Muscle weakness (generalized): Secondary | ICD-10-CM | POA: Diagnosis not present

## 2017-02-18 DIAGNOSIS — Z9181 History of falling: Secondary | ICD-10-CM | POA: Diagnosis not present

## 2017-02-18 DIAGNOSIS — S72002D Fracture of unspecified part of neck of left femur, subsequent encounter for closed fracture with routine healing: Secondary | ICD-10-CM | POA: Diagnosis not present

## 2017-02-20 DIAGNOSIS — Z7901 Long term (current) use of anticoagulants: Secondary | ICD-10-CM | POA: Diagnosis not present

## 2017-02-20 DIAGNOSIS — M6281 Muscle weakness (generalized): Secondary | ICD-10-CM | POA: Diagnosis not present

## 2017-02-20 DIAGNOSIS — I10 Essential (primary) hypertension: Secondary | ICD-10-CM | POA: Diagnosis not present

## 2017-02-20 DIAGNOSIS — S72002D Fracture of unspecified part of neck of left femur, subsequent encounter for closed fracture with routine healing: Secondary | ICD-10-CM | POA: Diagnosis not present

## 2017-02-20 DIAGNOSIS — S52612D Displaced fracture of left ulna styloid process, subsequent encounter for closed fracture with routine healing: Secondary | ICD-10-CM | POA: Diagnosis not present

## 2017-02-20 DIAGNOSIS — Z9181 History of falling: Secondary | ICD-10-CM | POA: Diagnosis not present

## 2017-02-20 DIAGNOSIS — Z9981 Dependence on supplemental oxygen: Secondary | ICD-10-CM | POA: Diagnosis not present

## 2017-02-20 DIAGNOSIS — F0390 Unspecified dementia without behavioral disturbance: Secondary | ICD-10-CM | POA: Diagnosis not present

## 2017-02-20 DIAGNOSIS — S52502D Unspecified fracture of the lower end of left radius, subsequent encounter for closed fracture with routine healing: Secondary | ICD-10-CM | POA: Diagnosis not present

## 2017-02-21 DIAGNOSIS — S72142D Displaced intertrochanteric fracture of left femur, subsequent encounter for closed fracture with routine healing: Secondary | ICD-10-CM | POA: Diagnosis not present

## 2017-02-25 DIAGNOSIS — S52502D Unspecified fracture of the lower end of left radius, subsequent encounter for closed fracture with routine healing: Secondary | ICD-10-CM | POA: Diagnosis not present

## 2017-02-25 DIAGNOSIS — I10 Essential (primary) hypertension: Secondary | ICD-10-CM | POA: Diagnosis not present

## 2017-02-25 DIAGNOSIS — S52612D Displaced fracture of left ulna styloid process, subsequent encounter for closed fracture with routine healing: Secondary | ICD-10-CM | POA: Diagnosis not present

## 2017-02-25 DIAGNOSIS — Z9181 History of falling: Secondary | ICD-10-CM | POA: Diagnosis not present

## 2017-02-25 DIAGNOSIS — Z7901 Long term (current) use of anticoagulants: Secondary | ICD-10-CM | POA: Diagnosis not present

## 2017-02-25 DIAGNOSIS — Z9981 Dependence on supplemental oxygen: Secondary | ICD-10-CM | POA: Diagnosis not present

## 2017-02-25 DIAGNOSIS — S72002D Fracture of unspecified part of neck of left femur, subsequent encounter for closed fracture with routine healing: Secondary | ICD-10-CM | POA: Diagnosis not present

## 2017-02-25 DIAGNOSIS — M6281 Muscle weakness (generalized): Secondary | ICD-10-CM | POA: Diagnosis not present

## 2017-02-25 DIAGNOSIS — F0390 Unspecified dementia without behavioral disturbance: Secondary | ICD-10-CM | POA: Diagnosis not present

## 2017-02-26 DIAGNOSIS — S72002D Fracture of unspecified part of neck of left femur, subsequent encounter for closed fracture with routine healing: Secondary | ICD-10-CM | POA: Diagnosis not present

## 2017-02-26 DIAGNOSIS — I10 Essential (primary) hypertension: Secondary | ICD-10-CM | POA: Diagnosis not present

## 2017-02-26 DIAGNOSIS — Z9181 History of falling: Secondary | ICD-10-CM | POA: Diagnosis not present

## 2017-02-26 DIAGNOSIS — Z9981 Dependence on supplemental oxygen: Secondary | ICD-10-CM | POA: Diagnosis not present

## 2017-02-26 DIAGNOSIS — F0390 Unspecified dementia without behavioral disturbance: Secondary | ICD-10-CM | POA: Diagnosis not present

## 2017-02-26 DIAGNOSIS — S52502D Unspecified fracture of the lower end of left radius, subsequent encounter for closed fracture with routine healing: Secondary | ICD-10-CM | POA: Diagnosis not present

## 2017-02-26 DIAGNOSIS — M6281 Muscle weakness (generalized): Secondary | ICD-10-CM | POA: Diagnosis not present

## 2017-02-26 DIAGNOSIS — Z7901 Long term (current) use of anticoagulants: Secondary | ICD-10-CM | POA: Diagnosis not present

## 2017-02-26 DIAGNOSIS — S52612D Displaced fracture of left ulna styloid process, subsequent encounter for closed fracture with routine healing: Secondary | ICD-10-CM | POA: Diagnosis not present

## 2017-03-04 DIAGNOSIS — S72002D Fracture of unspecified part of neck of left femur, subsequent encounter for closed fracture with routine healing: Secondary | ICD-10-CM | POA: Diagnosis not present

## 2017-03-04 DIAGNOSIS — Z9981 Dependence on supplemental oxygen: Secondary | ICD-10-CM | POA: Diagnosis not present

## 2017-03-04 DIAGNOSIS — Z9181 History of falling: Secondary | ICD-10-CM | POA: Diagnosis not present

## 2017-03-04 DIAGNOSIS — S52502D Unspecified fracture of the lower end of left radius, subsequent encounter for closed fracture with routine healing: Secondary | ICD-10-CM | POA: Diagnosis not present

## 2017-03-04 DIAGNOSIS — I10 Essential (primary) hypertension: Secondary | ICD-10-CM | POA: Diagnosis not present

## 2017-03-04 DIAGNOSIS — S52612D Displaced fracture of left ulna styloid process, subsequent encounter for closed fracture with routine healing: Secondary | ICD-10-CM | POA: Diagnosis not present

## 2017-03-04 DIAGNOSIS — F0390 Unspecified dementia without behavioral disturbance: Secondary | ICD-10-CM | POA: Diagnosis not present

## 2017-03-04 DIAGNOSIS — M6281 Muscle weakness (generalized): Secondary | ICD-10-CM | POA: Diagnosis not present

## 2017-03-04 DIAGNOSIS — Z7901 Long term (current) use of anticoagulants: Secondary | ICD-10-CM | POA: Diagnosis not present

## 2017-03-06 DIAGNOSIS — Z9981 Dependence on supplemental oxygen: Secondary | ICD-10-CM | POA: Diagnosis not present

## 2017-03-06 DIAGNOSIS — I10 Essential (primary) hypertension: Secondary | ICD-10-CM | POA: Diagnosis not present

## 2017-03-06 DIAGNOSIS — S52502D Unspecified fracture of the lower end of left radius, subsequent encounter for closed fracture with routine healing: Secondary | ICD-10-CM | POA: Diagnosis not present

## 2017-03-06 DIAGNOSIS — Z9181 History of falling: Secondary | ICD-10-CM | POA: Diagnosis not present

## 2017-03-06 DIAGNOSIS — S72002D Fracture of unspecified part of neck of left femur, subsequent encounter for closed fracture with routine healing: Secondary | ICD-10-CM | POA: Diagnosis not present

## 2017-03-06 DIAGNOSIS — Z7901 Long term (current) use of anticoagulants: Secondary | ICD-10-CM | POA: Diagnosis not present

## 2017-03-06 DIAGNOSIS — S52612D Displaced fracture of left ulna styloid process, subsequent encounter for closed fracture with routine healing: Secondary | ICD-10-CM | POA: Diagnosis not present

## 2017-03-06 DIAGNOSIS — M6281 Muscle weakness (generalized): Secondary | ICD-10-CM | POA: Diagnosis not present

## 2017-03-06 DIAGNOSIS — F0390 Unspecified dementia without behavioral disturbance: Secondary | ICD-10-CM | POA: Diagnosis not present

## 2017-03-12 DIAGNOSIS — S72002D Fracture of unspecified part of neck of left femur, subsequent encounter for closed fracture with routine healing: Secondary | ICD-10-CM | POA: Diagnosis not present

## 2017-03-12 DIAGNOSIS — Z9981 Dependence on supplemental oxygen: Secondary | ICD-10-CM | POA: Diagnosis not present

## 2017-03-12 DIAGNOSIS — Z9181 History of falling: Secondary | ICD-10-CM | POA: Diagnosis not present

## 2017-03-12 DIAGNOSIS — S52612D Displaced fracture of left ulna styloid process, subsequent encounter for closed fracture with routine healing: Secondary | ICD-10-CM | POA: Diagnosis not present

## 2017-03-12 DIAGNOSIS — Z7901 Long term (current) use of anticoagulants: Secondary | ICD-10-CM | POA: Diagnosis not present

## 2017-03-12 DIAGNOSIS — F0390 Unspecified dementia without behavioral disturbance: Secondary | ICD-10-CM | POA: Diagnosis not present

## 2017-03-12 DIAGNOSIS — S52502D Unspecified fracture of the lower end of left radius, subsequent encounter for closed fracture with routine healing: Secondary | ICD-10-CM | POA: Diagnosis not present

## 2017-03-12 DIAGNOSIS — M6281 Muscle weakness (generalized): Secondary | ICD-10-CM | POA: Diagnosis not present

## 2017-03-12 DIAGNOSIS — I10 Essential (primary) hypertension: Secondary | ICD-10-CM | POA: Diagnosis not present

## 2017-03-14 DIAGNOSIS — S52612D Displaced fracture of left ulna styloid process, subsequent encounter for closed fracture with routine healing: Secondary | ICD-10-CM | POA: Diagnosis not present

## 2017-03-14 DIAGNOSIS — S52502D Unspecified fracture of the lower end of left radius, subsequent encounter for closed fracture with routine healing: Secondary | ICD-10-CM | POA: Diagnosis not present

## 2017-03-14 DIAGNOSIS — Z9981 Dependence on supplemental oxygen: Secondary | ICD-10-CM | POA: Diagnosis not present

## 2017-03-14 DIAGNOSIS — Z7901 Long term (current) use of anticoagulants: Secondary | ICD-10-CM | POA: Diagnosis not present

## 2017-03-14 DIAGNOSIS — I10 Essential (primary) hypertension: Secondary | ICD-10-CM | POA: Diagnosis not present

## 2017-03-14 DIAGNOSIS — S72002D Fracture of unspecified part of neck of left femur, subsequent encounter for closed fracture with routine healing: Secondary | ICD-10-CM | POA: Diagnosis not present

## 2017-03-14 DIAGNOSIS — F0390 Unspecified dementia without behavioral disturbance: Secondary | ICD-10-CM | POA: Diagnosis not present

## 2017-03-14 DIAGNOSIS — Z9181 History of falling: Secondary | ICD-10-CM | POA: Diagnosis not present

## 2017-03-14 DIAGNOSIS — M6281 Muscle weakness (generalized): Secondary | ICD-10-CM | POA: Diagnosis not present

## 2017-03-19 DIAGNOSIS — M6281 Muscle weakness (generalized): Secondary | ICD-10-CM | POA: Diagnosis not present

## 2017-03-19 DIAGNOSIS — Z9181 History of falling: Secondary | ICD-10-CM | POA: Diagnosis not present

## 2017-03-19 DIAGNOSIS — F0390 Unspecified dementia without behavioral disturbance: Secondary | ICD-10-CM | POA: Diagnosis not present

## 2017-03-19 DIAGNOSIS — S52502D Unspecified fracture of the lower end of left radius, subsequent encounter for closed fracture with routine healing: Secondary | ICD-10-CM | POA: Diagnosis not present

## 2017-03-19 DIAGNOSIS — S52612D Displaced fracture of left ulna styloid process, subsequent encounter for closed fracture with routine healing: Secondary | ICD-10-CM | POA: Diagnosis not present

## 2017-03-19 DIAGNOSIS — Z9981 Dependence on supplemental oxygen: Secondary | ICD-10-CM | POA: Diagnosis not present

## 2017-03-19 DIAGNOSIS — Z7901 Long term (current) use of anticoagulants: Secondary | ICD-10-CM | POA: Diagnosis not present

## 2017-03-19 DIAGNOSIS — I10 Essential (primary) hypertension: Secondary | ICD-10-CM | POA: Diagnosis not present

## 2017-03-19 DIAGNOSIS — S72002D Fracture of unspecified part of neck of left femur, subsequent encounter for closed fracture with routine healing: Secondary | ICD-10-CM | POA: Diagnosis not present

## 2017-03-21 DIAGNOSIS — S52612D Displaced fracture of left ulna styloid process, subsequent encounter for closed fracture with routine healing: Secondary | ICD-10-CM | POA: Diagnosis not present

## 2017-03-21 DIAGNOSIS — F0390 Unspecified dementia without behavioral disturbance: Secondary | ICD-10-CM | POA: Diagnosis not present

## 2017-03-21 DIAGNOSIS — Z7901 Long term (current) use of anticoagulants: Secondary | ICD-10-CM | POA: Diagnosis not present

## 2017-03-21 DIAGNOSIS — S72002D Fracture of unspecified part of neck of left femur, subsequent encounter for closed fracture with routine healing: Secondary | ICD-10-CM | POA: Diagnosis not present

## 2017-03-21 DIAGNOSIS — Z9181 History of falling: Secondary | ICD-10-CM | POA: Diagnosis not present

## 2017-03-21 DIAGNOSIS — M6281 Muscle weakness (generalized): Secondary | ICD-10-CM | POA: Diagnosis not present

## 2017-03-21 DIAGNOSIS — Z9981 Dependence on supplemental oxygen: Secondary | ICD-10-CM | POA: Diagnosis not present

## 2017-03-21 DIAGNOSIS — S52502D Unspecified fracture of the lower end of left radius, subsequent encounter for closed fracture with routine healing: Secondary | ICD-10-CM | POA: Diagnosis not present

## 2017-03-21 DIAGNOSIS — I10 Essential (primary) hypertension: Secondary | ICD-10-CM | POA: Diagnosis not present

## 2017-03-25 DIAGNOSIS — I10 Essential (primary) hypertension: Secondary | ICD-10-CM | POA: Diagnosis not present

## 2017-03-25 DIAGNOSIS — Z9181 History of falling: Secondary | ICD-10-CM | POA: Diagnosis not present

## 2017-03-25 DIAGNOSIS — Z9981 Dependence on supplemental oxygen: Secondary | ICD-10-CM | POA: Diagnosis not present

## 2017-03-25 DIAGNOSIS — S52612D Displaced fracture of left ulna styloid process, subsequent encounter for closed fracture with routine healing: Secondary | ICD-10-CM | POA: Diagnosis not present

## 2017-03-25 DIAGNOSIS — S52502D Unspecified fracture of the lower end of left radius, subsequent encounter for closed fracture with routine healing: Secondary | ICD-10-CM | POA: Diagnosis not present

## 2017-03-25 DIAGNOSIS — F0390 Unspecified dementia without behavioral disturbance: Secondary | ICD-10-CM | POA: Diagnosis not present

## 2017-03-25 DIAGNOSIS — M6281 Muscle weakness (generalized): Secondary | ICD-10-CM | POA: Diagnosis not present

## 2017-03-25 DIAGNOSIS — Z7901 Long term (current) use of anticoagulants: Secondary | ICD-10-CM | POA: Diagnosis not present

## 2017-03-25 DIAGNOSIS — S72002D Fracture of unspecified part of neck of left femur, subsequent encounter for closed fracture with routine healing: Secondary | ICD-10-CM | POA: Diagnosis not present

## 2017-03-27 DIAGNOSIS — Z9981 Dependence on supplemental oxygen: Secondary | ICD-10-CM | POA: Diagnosis not present

## 2017-03-27 DIAGNOSIS — M6281 Muscle weakness (generalized): Secondary | ICD-10-CM | POA: Diagnosis not present

## 2017-03-27 DIAGNOSIS — Z7901 Long term (current) use of anticoagulants: Secondary | ICD-10-CM | POA: Diagnosis not present

## 2017-03-27 DIAGNOSIS — Z9181 History of falling: Secondary | ICD-10-CM | POA: Diagnosis not present

## 2017-03-27 DIAGNOSIS — I10 Essential (primary) hypertension: Secondary | ICD-10-CM | POA: Diagnosis not present

## 2017-03-27 DIAGNOSIS — S72002D Fracture of unspecified part of neck of left femur, subsequent encounter for closed fracture with routine healing: Secondary | ICD-10-CM | POA: Diagnosis not present

## 2017-03-27 DIAGNOSIS — F0390 Unspecified dementia without behavioral disturbance: Secondary | ICD-10-CM | POA: Diagnosis not present

## 2017-03-27 DIAGNOSIS — S52502D Unspecified fracture of the lower end of left radius, subsequent encounter for closed fracture with routine healing: Secondary | ICD-10-CM | POA: Diagnosis not present

## 2017-03-27 DIAGNOSIS — S52612D Displaced fracture of left ulna styloid process, subsequent encounter for closed fracture with routine healing: Secondary | ICD-10-CM | POA: Diagnosis not present

## 2017-04-02 DIAGNOSIS — Z7901 Long term (current) use of anticoagulants: Secondary | ICD-10-CM | POA: Diagnosis not present

## 2017-04-02 DIAGNOSIS — S52502D Unspecified fracture of the lower end of left radius, subsequent encounter for closed fracture with routine healing: Secondary | ICD-10-CM | POA: Diagnosis not present

## 2017-04-02 DIAGNOSIS — M6281 Muscle weakness (generalized): Secondary | ICD-10-CM | POA: Diagnosis not present

## 2017-04-02 DIAGNOSIS — Z9981 Dependence on supplemental oxygen: Secondary | ICD-10-CM | POA: Diagnosis not present

## 2017-04-02 DIAGNOSIS — I10 Essential (primary) hypertension: Secondary | ICD-10-CM | POA: Diagnosis not present

## 2017-04-02 DIAGNOSIS — S72002D Fracture of unspecified part of neck of left femur, subsequent encounter for closed fracture with routine healing: Secondary | ICD-10-CM | POA: Diagnosis not present

## 2017-04-02 DIAGNOSIS — F0390 Unspecified dementia without behavioral disturbance: Secondary | ICD-10-CM | POA: Diagnosis not present

## 2017-04-02 DIAGNOSIS — Z9181 History of falling: Secondary | ICD-10-CM | POA: Diagnosis not present

## 2017-04-02 DIAGNOSIS — S52612D Displaced fracture of left ulna styloid process, subsequent encounter for closed fracture with routine healing: Secondary | ICD-10-CM | POA: Diagnosis not present

## 2017-04-03 DIAGNOSIS — Z7901 Long term (current) use of anticoagulants: Secondary | ICD-10-CM | POA: Diagnosis not present

## 2017-04-03 DIAGNOSIS — Z9181 History of falling: Secondary | ICD-10-CM | POA: Diagnosis not present

## 2017-04-03 DIAGNOSIS — S52612D Displaced fracture of left ulna styloid process, subsequent encounter for closed fracture with routine healing: Secondary | ICD-10-CM | POA: Diagnosis not present

## 2017-04-03 DIAGNOSIS — Z9981 Dependence on supplemental oxygen: Secondary | ICD-10-CM | POA: Diagnosis not present

## 2017-04-03 DIAGNOSIS — M6281 Muscle weakness (generalized): Secondary | ICD-10-CM | POA: Diagnosis not present

## 2017-04-03 DIAGNOSIS — S72002D Fracture of unspecified part of neck of left femur, subsequent encounter for closed fracture with routine healing: Secondary | ICD-10-CM | POA: Diagnosis not present

## 2017-04-03 DIAGNOSIS — S52502D Unspecified fracture of the lower end of left radius, subsequent encounter for closed fracture with routine healing: Secondary | ICD-10-CM | POA: Diagnosis not present

## 2017-04-03 DIAGNOSIS — I10 Essential (primary) hypertension: Secondary | ICD-10-CM | POA: Diagnosis not present

## 2017-04-03 DIAGNOSIS — F0390 Unspecified dementia without behavioral disturbance: Secondary | ICD-10-CM | POA: Diagnosis not present

## 2017-04-08 DIAGNOSIS — M6281 Muscle weakness (generalized): Secondary | ICD-10-CM | POA: Diagnosis not present

## 2017-04-08 DIAGNOSIS — S52612D Displaced fracture of left ulna styloid process, subsequent encounter for closed fracture with routine healing: Secondary | ICD-10-CM | POA: Diagnosis not present

## 2017-04-08 DIAGNOSIS — Z7901 Long term (current) use of anticoagulants: Secondary | ICD-10-CM | POA: Diagnosis not present

## 2017-04-08 DIAGNOSIS — F0390 Unspecified dementia without behavioral disturbance: Secondary | ICD-10-CM | POA: Diagnosis not present

## 2017-04-08 DIAGNOSIS — I10 Essential (primary) hypertension: Secondary | ICD-10-CM | POA: Diagnosis not present

## 2017-04-08 DIAGNOSIS — Z9181 History of falling: Secondary | ICD-10-CM | POA: Diagnosis not present

## 2017-04-08 DIAGNOSIS — Z9981 Dependence on supplemental oxygen: Secondary | ICD-10-CM | POA: Diagnosis not present

## 2017-04-08 DIAGNOSIS — S52502D Unspecified fracture of the lower end of left radius, subsequent encounter for closed fracture with routine healing: Secondary | ICD-10-CM | POA: Diagnosis not present

## 2017-04-08 DIAGNOSIS — S72002D Fracture of unspecified part of neck of left femur, subsequent encounter for closed fracture with routine healing: Secondary | ICD-10-CM | POA: Diagnosis not present

## 2017-04-10 DIAGNOSIS — S72002D Fracture of unspecified part of neck of left femur, subsequent encounter for closed fracture with routine healing: Secondary | ICD-10-CM | POA: Diagnosis not present

## 2017-04-10 DIAGNOSIS — F0390 Unspecified dementia without behavioral disturbance: Secondary | ICD-10-CM | POA: Diagnosis not present

## 2017-04-10 DIAGNOSIS — S52502D Unspecified fracture of the lower end of left radius, subsequent encounter for closed fracture with routine healing: Secondary | ICD-10-CM | POA: Diagnosis not present

## 2017-04-10 DIAGNOSIS — S52612D Displaced fracture of left ulna styloid process, subsequent encounter for closed fracture with routine healing: Secondary | ICD-10-CM | POA: Diagnosis not present

## 2017-04-10 DIAGNOSIS — M6281 Muscle weakness (generalized): Secondary | ICD-10-CM | POA: Diagnosis not present

## 2017-04-10 DIAGNOSIS — Z7901 Long term (current) use of anticoagulants: Secondary | ICD-10-CM | POA: Diagnosis not present

## 2017-04-10 DIAGNOSIS — Z9981 Dependence on supplemental oxygen: Secondary | ICD-10-CM | POA: Diagnosis not present

## 2017-04-10 DIAGNOSIS — Z9181 History of falling: Secondary | ICD-10-CM | POA: Diagnosis not present

## 2017-04-10 DIAGNOSIS — I10 Essential (primary) hypertension: Secondary | ICD-10-CM | POA: Diagnosis not present

## 2017-04-16 ENCOUNTER — Ambulatory Visit (INDEPENDENT_AMBULATORY_CARE_PROVIDER_SITE_OTHER): Payer: Medicare HMO | Admitting: Family Medicine

## 2017-04-16 DIAGNOSIS — I10 Essential (primary) hypertension: Secondary | ICD-10-CM

## 2017-04-16 DIAGNOSIS — Z9181 History of falling: Secondary | ICD-10-CM

## 2017-04-16 DIAGNOSIS — S52612D Displaced fracture of left ulna styloid process, subsequent encounter for closed fracture with routine healing: Secondary | ICD-10-CM | POA: Diagnosis not present

## 2017-04-16 DIAGNOSIS — F0390 Unspecified dementia without behavioral disturbance: Secondary | ICD-10-CM

## 2017-04-16 DIAGNOSIS — M6281 Muscle weakness (generalized): Secondary | ICD-10-CM

## 2017-04-16 DIAGNOSIS — S52502D Unspecified fracture of the lower end of left radius, subsequent encounter for closed fracture with routine healing: Secondary | ICD-10-CM

## 2017-04-16 DIAGNOSIS — S72002D Fracture of unspecified part of neck of left femur, subsequent encounter for closed fracture with routine healing: Secondary | ICD-10-CM

## 2017-04-17 DIAGNOSIS — Z9981 Dependence on supplemental oxygen: Secondary | ICD-10-CM | POA: Diagnosis not present

## 2017-04-17 DIAGNOSIS — S72002D Fracture of unspecified part of neck of left femur, subsequent encounter for closed fracture with routine healing: Secondary | ICD-10-CM | POA: Diagnosis not present

## 2017-04-17 DIAGNOSIS — I10 Essential (primary) hypertension: Secondary | ICD-10-CM | POA: Diagnosis not present

## 2017-04-17 DIAGNOSIS — M6281 Muscle weakness (generalized): Secondary | ICD-10-CM | POA: Diagnosis not present

## 2017-04-17 DIAGNOSIS — Z7901 Long term (current) use of anticoagulants: Secondary | ICD-10-CM | POA: Diagnosis not present

## 2017-04-17 DIAGNOSIS — Z9181 History of falling: Secondary | ICD-10-CM | POA: Diagnosis not present

## 2017-04-17 DIAGNOSIS — S52612D Displaced fracture of left ulna styloid process, subsequent encounter for closed fracture with routine healing: Secondary | ICD-10-CM | POA: Diagnosis not present

## 2017-04-17 DIAGNOSIS — F0390 Unspecified dementia without behavioral disturbance: Secondary | ICD-10-CM | POA: Diagnosis not present

## 2017-04-17 DIAGNOSIS — S52502D Unspecified fracture of the lower end of left radius, subsequent encounter for closed fracture with routine healing: Secondary | ICD-10-CM | POA: Diagnosis not present

## 2017-04-19 ENCOUNTER — Telehealth: Payer: Self-pay | Admitting: *Deleted

## 2017-04-19 DIAGNOSIS — S52502D Unspecified fracture of the lower end of left radius, subsequent encounter for closed fracture with routine healing: Secondary | ICD-10-CM | POA: Diagnosis not present

## 2017-04-19 DIAGNOSIS — Z9181 History of falling: Secondary | ICD-10-CM | POA: Diagnosis not present

## 2017-04-19 DIAGNOSIS — S72002D Fracture of unspecified part of neck of left femur, subsequent encounter for closed fracture with routine healing: Secondary | ICD-10-CM | POA: Diagnosis not present

## 2017-04-19 DIAGNOSIS — S52612D Displaced fracture of left ulna styloid process, subsequent encounter for closed fracture with routine healing: Secondary | ICD-10-CM | POA: Diagnosis not present

## 2017-04-19 DIAGNOSIS — Z9981 Dependence on supplemental oxygen: Secondary | ICD-10-CM | POA: Diagnosis not present

## 2017-04-19 DIAGNOSIS — Z7901 Long term (current) use of anticoagulants: Secondary | ICD-10-CM | POA: Diagnosis not present

## 2017-04-19 DIAGNOSIS — F0390 Unspecified dementia without behavioral disturbance: Secondary | ICD-10-CM | POA: Diagnosis not present

## 2017-04-19 DIAGNOSIS — I10 Essential (primary) hypertension: Secondary | ICD-10-CM | POA: Diagnosis not present

## 2017-04-19 DIAGNOSIS — M6281 Muscle weakness (generalized): Secondary | ICD-10-CM | POA: Diagnosis not present

## 2017-04-19 MED ORDER — MEMANTINE HCL ER 28 MG PO CP24: 28.0000 mg | ORAL_CAPSULE | Freq: Every day | ORAL | 3 refills | Status: DC

## 2017-04-19 MED ORDER — DONEPEZIL HCL 10 MG PO TABS: 10.0000 mg | ORAL_TABLET | Freq: Every day | ORAL | 3 refills | Status: DC

## 2017-04-19 NOTE — Telephone Encounter (Signed)
[facility] change

## 2017-04-19 NOTE — Telephone Encounter (Signed)
Is it the namzaric that is too expensive? It is a combination medicine so can try prescribing the two meds separately, memantine ER 28mg  qhs, donepezil 10mg  qhs. Does [facility] need the med sent in? Can send in #30 tabs with 3 refills of each.

## 2017-04-19 NOTE — Telephone Encounter (Signed)
Wants to  Changed, way too expensive [facility] if [facility] can get namzeric

## 2017-04-22 DIAGNOSIS — S51012A Laceration without foreign body of left elbow, initial encounter: Secondary | ICD-10-CM | POA: Diagnosis not present

## 2017-04-22 DIAGNOSIS — S199XXA Unspecified injury of neck, initial encounter: Secondary | ICD-10-CM | POA: Diagnosis not present

## 2017-04-22 DIAGNOSIS — R51 Headache: Secondary | ICD-10-CM | POA: Diagnosis not present

## 2017-04-22 DIAGNOSIS — Z79899 Other long term (current) drug therapy: Secondary | ICD-10-CM | POA: Diagnosis not present

## 2017-04-22 DIAGNOSIS — W1839XA Other fall on same level, initial encounter: Secondary | ICD-10-CM | POA: Diagnosis not present

## 2017-04-22 DIAGNOSIS — F4489 Other dissociative and conversion disorders: Secondary | ICD-10-CM | POA: Diagnosis not present

## 2017-04-22 DIAGNOSIS — S0990XA Unspecified injury of head, initial encounter: Secondary | ICD-10-CM | POA: Diagnosis not present

## 2017-04-22 DIAGNOSIS — N390 Urinary tract infection, site not specified: Secondary | ICD-10-CM | POA: Diagnosis not present

## 2017-04-22 DIAGNOSIS — S0003XA Contusion of scalp, initial encounter: Secondary | ICD-10-CM | POA: Diagnosis not present

## 2017-04-22 DIAGNOSIS — R279 Unspecified lack of coordination: Secondary | ICD-10-CM | POA: Diagnosis not present

## 2017-04-22 DIAGNOSIS — S098XXA Other specified injuries of head, initial encounter: Secondary | ICD-10-CM | POA: Diagnosis not present

## 2017-04-22 DIAGNOSIS — F0390 Unspecified dementia without behavioral disturbance: Secondary | ICD-10-CM | POA: Diagnosis not present

## 2017-04-22 DIAGNOSIS — Z743 Need for continuous supervision: Secondary | ICD-10-CM | POA: Diagnosis not present

## 2017-04-22 DIAGNOSIS — S0093XA Contusion of unspecified part of head, initial encounter: Secondary | ICD-10-CM | POA: Diagnosis not present

## 2017-04-22 DIAGNOSIS — G4489 Other headache syndrome: Secondary | ICD-10-CM | POA: Diagnosis not present

## 2017-04-23 DIAGNOSIS — S72142D Displaced intertrochanteric fracture of left femur, subsequent encounter for closed fracture with routine healing: Secondary | ICD-10-CM | POA: Diagnosis not present

## 2017-05-09 ENCOUNTER — Encounter (HOSPITAL_COMMUNITY): Payer: Self-pay | Admitting: Orthopedic Surgery

## 2017-05-09 NOTE — Addendum Note (Signed)
Addendum  created 05/09/17 1057 by Jerusalen Mateja, MD   Sign clinical note    

## 2017-05-16 DIAGNOSIS — R339 Retention of urine, unspecified: Secondary | ICD-10-CM | POA: Diagnosis not present

## 2017-05-28 ENCOUNTER — Ambulatory Visit (INDEPENDENT_AMBULATORY_CARE_PROVIDER_SITE_OTHER): Payer: Medicare HMO | Admitting: Family Medicine

## 2017-05-28 ENCOUNTER — Encounter: Payer: Self-pay | Admitting: Family Medicine

## 2017-05-28 VITALS — BP 122/62 | HR 50 | Temp 97.0°F | Ht 62.0 in | Wt 146.0 lb

## 2017-05-28 DIAGNOSIS — E7800 Pure hypercholesterolemia, unspecified: Secondary | ICD-10-CM

## 2017-05-28 DIAGNOSIS — E559 Vitamin D deficiency, unspecified: Secondary | ICD-10-CM | POA: Diagnosis not present

## 2017-05-28 DIAGNOSIS — I499 Cardiac arrhythmia, unspecified: Secondary | ICD-10-CM

## 2017-05-28 DIAGNOSIS — F0280 Dementia in other diseases classified elsewhere without behavioral disturbance: Secondary | ICD-10-CM | POA: Diagnosis not present

## 2017-05-28 DIAGNOSIS — I10 Essential (primary) hypertension: Secondary | ICD-10-CM | POA: Diagnosis not present

## 2017-05-28 DIAGNOSIS — G309 Alzheimer's disease, unspecified: Secondary | ICD-10-CM

## 2017-05-28 MED ORDER — APIXABAN 5 MG PO TABS: 5.0000 mg | ORAL_TABLET | Freq: Two times a day (BID) | ORAL | 6 refills | Status: AC

## 2017-05-28 NOTE — Progress Notes (Signed)
Subjective:    Patient ID: Vickie Hall, female    DOB: 04/18/1928, 83 y.o.   MRN: [id number]  HPI  Pt here for follow up and management of chronic medical problems which includes hypertension. She is taking medication regularly per the staff at [facility] assisted living.The patient today returns for her regular checkup. Her significant history is that she had a left hip fracture and left wrist fracture in February. She subsequently had a Holter monitor done by the cardiologist and no atrial fib was found. She has a when necessary return visit planned to the cardiologist. She is back at the assisted living facility. She is in the Alzheimer's section of the facility. She has no specific complaints she is due to return an FOBT and get lab work. Her vital signs are stable. Aricept was discontinued because she is on namzeric the patient is pleasant and seems to be stable as far as her memory is concerned. She has no complaints and her assistant acknowledges that she is getting ongoing physical therapy to help her with walking following her hip fracture and surgery. She denies any chest pain or shortness of breath. She denies any trouble with her stomach including nausea vomiting diarrhea blood in the stool or black tarry bowel movements and is passing her water without problems. The caregiver agrees with these responses.     Patient Active Problem List   Diagnosis Date Noted  . Foley catheter in place 11/12/2016  . Closed pertrochanteric fracture of femur, left, initial encounter ([place]) 10/29/2016  . Closed left hip fracture ([place]) 10/28/2016  . Dementia due to Alzheimer's disease 10/28/2016  . Aortic atherosclerosis ([place]) 05/29/2016  . Osteoporosis, senile 12/16/2013  . GERD (gastroesophageal reflux disease) 11/11/2013  . HTN (hypertension) 11/11/2013  . Hyperlipidemia 11/11/2013   Outpatient Encounter Prescriptions as of 05/28/2017  Medication Sig  . donepezil (ARICEPT) 10 MG tablet Take 1  tablet (10 mg total) by mouth at bedtime.  Marland Kitchen ezetimibe (ZETIA) 10 MG tablet Take 10 mg by mouth daily.  . ferrous sulfate 325 (65 FE) MG tablet Take 1 tablet (325 mg total) by mouth daily with breakfast.  . Memantine HCl-Donepezil HCl (NAMZARIC) 28-10 MG CP24 Take 1 tablet by mouth at bedtime.  . senna-docusate (SENOKOT-S) 8.6-50 MG tablet Take 2 tablets by mouth 2 (two) times daily.  . valsartan (DIOVAN) 160 MG tablet   . Vitamin D, Ergocalciferol, (DRISDOL) 50000 UNITS CAPS capsule Take 1 capsule (50,000 Units total) by mouth every 7 (seven) days.  Marland Kitchen acetaminophen (TYLENOL) 325 MG tablet Take 2 tablets (650 mg total) by mouth every 6 (six) hours as needed for mild pain (or Fever >/= 101). (Patient not taking: Reported on 05/28/2017)  . alum & mag hydroxide-simeth (MAALOX/MYLANTA) 200-200-20 MG/5ML suspension Take 30 mLs by mouth as needed for indigestion or heartburn. (Patient not taking: Reported on 05/28/2017)  . feeding supplement, ENSURE ENLIVE, (ENSURE ENLIVE) LIQD Take 237 mLs by mouth 2 (two) times daily between meals. (Patient not taking: Reported on 05/28/2017)  . HYDROcodone-acetaminophen (NORCO/VICODIN) 5-325 MG tablet Take 1 tablet by mouth every 6 (six) hours as needed for moderate pain. (Patient not taking: Reported on 05/28/2017)  . [DISCONTINUED] memantine (NAMENDA XR) 28 MG CP24 24 hr capsule Take 1 capsule (28 mg total) by mouth at bedtime.   No facility-administered encounter medications on file as of 05/28/2017.       Review of Systems  Constitutional: Negative.   HENT: Negative.   Eyes: Negative.  Respiratory: Negative.   Cardiovascular: Negative.   Gastrointestinal: Negative.   Endocrine: Negative.   Genitourinary: Negative.   Musculoskeletal: Negative.   Skin: Negative.   Allergic/Immunologic: Negative.   Neurological: Negative.   Hematological: Negative.   Psychiatric/Behavioral: Negative.        Objective:   Physical Exam  Constitutional: She is oriented to  person, place, and time. No distress.  Elderly female in no acute distress in her wheelchair.  HENT:  Head: Normocephalic and atraumatic.  Right Ear: External ear normal.  Left Ear: External ear normal.  Nose: Nose normal.  Mouth/Throat: Oropharynx is clear and moist. No oropharyngeal exudate.  Well-hydrated.  Eyes: Pupils are equal, round, and reactive to light. Conjunctivae and EOM are normal. Right eye exhibits no discharge. Left eye exhibits no discharge. No scleral icterus.  Neck: Normal range of motion. Neck supple. No thyromegaly present.  No thyromegaly bruits or anterior cervical adenopathy  Cardiovascular: Normal rate and normal heart sounds.   No murmur heard. Heart is irregular irregular at about 60/m  Pulmonary/Chest: Effort normal and breath sounds normal. No respiratory distress. She has no wheezes. She has no rales.  Abdominal: Soft. Bowel sounds are normal. She exhibits no mass. There is tenderness. There is no rebound and no guarding.  Generalized abdominal tenderness without masses or organ enlargement  Musculoskeletal: She exhibits tenderness. She exhibits no edema.  Patient comes to the office in wheelchair and is currently getting physical therapy for her rehabilitation from her hip fracture and wrist fracture.  Lymphadenopathy:    She has no cervical adenopathy.  Neurological: She is alert and oriented to person, place, and time.  Skin: Skin is warm and dry. No rash noted.  Psychiatric: She has a normal mood and affect. Her behavior is normal. Thought content normal.  The patient continues to have her dementia which currently seems stable. She knows the names of her signs and responded in a joking way to questions asked of her during the visit today. This does not appear to be any worse than it has been in the past.  Nursing note and vitals reviewed.   BP 122/62 (BP Location: Right Arm)   Pulse (!) 50   Temp (!) 97 F (36.1 C) (Oral)   Ht _0  (1.575 m)   Wt  146 lb (66.2 kg)   BMI 26.70 kg/m \  EKG with results pending==== atrial fibrillation at 64/m    Assessment & Plan:  1. Essential hypertension -The blood pressure is good and she will continue with current treatment which includes valsartan 160 mg daily. - BMP8+EGFR - CBC with Differential/Platelet - Hepatic function panel  2. Vitamin D deficiency -Continue with vitamin D replacement pending results of lab work - CBC with Differential/Platelet - VITAMIN D 25 Hydroxy (Vit-D Deficiency, Fractures)  3. Dementia due to Alzheimer's disease -Continue with Namenda and Aricept in combination. - CBC with Differential/Platelet  4. Pure hypercholesterolemia -Continue with Zetia and low-cholesterol diet - CBC with Differential/Platelet - Lipid panel  5. Irregular heart beat -The patient did have a fall back in the late winter and sustained a hip fracture and wrist fracture at that time. Subsequent Holter monitor did not show any atrial fib. Today she was in an irregular rhythm. - EKG 12-Lead -The patient's last creatinine was good and she will get another one today and will be started on eliquis 5 mg twice daily. -We will also schedule her to follow-up with Dr. Lolly Mustache   Meds ordered this  encounter  Medications  . apixaban (ELIQUIS) 5 MG TABS tablet    Sig: Take 1 tablet (5 mg total) by mouth 2 (two) times daily.    Dispense:  60 tablet    Refill:  6   Patient Instructions                       Medicare Annual Wellness Visit  [place] and the medical providers at [facility] strive to bring you the best medical care.  In doing so we not only want to address your current medical conditions and concerns but also to detect new conditions early and prevent illness, disease and health-related problems.    Medicare offers a yearly Wellness Visit which allows our clinical staff to assess your need for preventative services including immunizations, lifestyle  education, counseling to decrease risk of preventable diseases and screening for fall risk and other medical concerns.    This visit is provided free of charge (no copay) for all Medicare recipients. The clinical pharmacists at [facility] have begun to conduct these Wellness Visits which will also include a thorough review of all your medications.    As you primary medical provider recommend that you make an appointment for your Annual Wellness Visit if you have not done so already this year.  You may set up this appointment before you leave today or you may call back ([phone]) and schedule an appointment.  Please make sure when you call that you mention that you are scheduling your Annual Wellness Visit with the clinical pharmacist so that the appointment may be made for the proper length of time.     Continue current medications. Continue good therapeutic lifestyle changes which include good diet and exercise. Fall precautions discussed with patient. If an FOBT was given today- please return it to our front desk. If you are over 57 years old - you may need Prevnar 82 or the adult Pneumonia vaccine.  **Flu shots are available--- please call and schedule a FLU-CLINIC appointment**  After your visit with [place] today you will receive a survey in the mail or online from [employer] regarding your care with [place]. Please take a moment to fill this out. Your feedback is very important to [place] as you can help [place] better understand your patient needs as well as improve your experience and satisfaction. WE CARE ABOUT YOU!!!   We will continue with her current treatment and depending on the EKG may actually have her start one of the novel blood thinners.    Arrie Senate MD

## 2017-05-28 NOTE — Patient Instructions (Addendum)
Medicare Annual Wellness Visit  [place] and the medical providers at [facility] strive to bring you the best medical care.  In doing so we not only want to address your current medical conditions and concerns but also to detect new conditions early and prevent illness, disease and health-related problems.    Medicare offers a yearly Wellness Visit which allows our clinical staff to assess your need for preventative services including immunizations, lifestyle education, counseling to decrease risk of preventable diseases and screening for fall risk and other medical concerns.    This visit is provided free of charge (no copay) for all Medicare recipients. The clinical pharmacists at [facility] have begun to conduct these Wellness Visits which will also include a thorough review of all your medications.    As you primary medical provider recommend that you make an appointment for your Annual Wellness Visit if you have not done so already this year.  You may set up this appointment before you leave today or you may call back [phone] and schedule an appointment.  Please make sure when you call that you mention that you are scheduling your Annual Wellness Visit with the clinical pharmacist so that the appointment may be made for the proper length of time.     Continue current medications. Continue good therapeutic lifestyle changes which include good diet and exercise. Fall precautions discussed with patient. If an FOBT was given today- please return it to our front desk. If you are over 81 years old - you may need Prevnar 13 or the adult Pneumonia vaccine.  **Flu shots are available--- please call and schedule a FLU-CLINIC appointment**  After your visit with us today you will receive a survey in the mail or online from [employer] regarding your care with us. Please take a moment to fill this out. Your feedback is very  important to us as you can help us better understand your patient needs as well as improve your experience and satisfaction. WE CARE ABOUT YOU!!!   We will continue with her current treatment and depending on the EKG may actually have her start one of the novel blood thinners.

## 2017-05-29 ENCOUNTER — Encounter: Payer: Self-pay | Admitting: *Deleted

## 2017-05-29 LAB — CBC WITH DIFFERENTIAL/PLATELET
BASOS: 1 %
Basophils Absolute: 0 10*3/uL (ref 0.0–0.2)
EOS (ABSOLUTE): 0.2 10*3/uL (ref 0.0–0.4)
Eos: 3 %
HEMATOCRIT: 43.1 % (ref 34.0–46.6)
Hemoglobin: 14.4 g/dL (ref 11.1–15.9)
Immature Grans (Abs): 0 10*3/uL (ref 0.0–0.1)
Immature Granulocytes: 0 %
Lymphocytes Absolute: 2.4 10*3/uL (ref 0.7–3.1)
Lymphs: 32 %
MCH: 31 pg (ref 26.6–33.0)
MCHC: 33.4 g/dL (ref 31.5–35.7)
MCV: 93 fL (ref 79–97)
MONOS ABS: 0.8 10*3/uL (ref 0.1–0.9)
Monocytes: 10 %
Neutrophils Absolute: 4 10*3/uL (ref 1.4–7.0)
Neutrophils: 54 %
PLATELETS: 315 10*3/uL (ref 150–379)
RBC: 4.64 x10E6/uL (ref 3.77–5.28)
RDW: 14.3 % (ref 12.3–15.4)
WBC: 7.4 10*3/uL (ref 3.4–10.8)

## 2017-05-29 LAB — BMP8+EGFR
BUN / CREAT RATIO: 20 (ref 12–28)
BUN: 15 mg/dL (ref 8–27)
CO2: 25 mmol/L (ref 20–29)
Calcium: 9.7 mg/dL (ref 8.7–10.3)
Chloride: 100 mmol/L (ref 96–106)
Creatinine, Ser: 0.75 mg/dL (ref 0.57–1.00)
GFR, EST AFRICAN AMERICAN: 83 mL/min/{1.73_m2} (ref >59)
GFR, EST NON AFRICAN AMERICAN: 72 mL/min/{1.73_m2} (ref >59)
Glucose: 102 mg/dL — ABNORMAL HIGH (ref 65–99)
POTASSIUM: 3.7 mmol/L (ref 3.5–5.2)
SODIUM: 142 mmol/L (ref 134–144)

## 2017-05-29 LAB — LIPID PANEL
CHOL/HDL RATIO: 3.7 ratio (ref 0.0–4.4)
Cholesterol, Total: 212 mg/dL — ABNORMAL HIGH (ref 100–199)
HDL: 57 mg/dL (ref >39)
LDL Calculated: 116 mg/dL — ABNORMAL HIGH (ref 0–99)
Triglycerides: 197 mg/dL — ABNORMAL HIGH (ref 0–149)
VLDL CHOLESTEROL CAL: 39 mg/dL (ref 5–40)

## 2017-05-29 LAB — HEPATIC FUNCTION PANEL
ALT: 10 IU/L (ref 0–32)
AST: 13 IU/L (ref 0–40)
Albumin: 4.6 g/dL (ref 3.5–4.7)
Alkaline Phosphatase: 85 IU/L (ref 39–117)
BILIRUBIN TOTAL: 0.3 mg/dL (ref 0.0–1.2)
BILIRUBIN, DIRECT: 0.09 mg/dL (ref 0.00–0.40)
Total Protein: 7.2 g/dL (ref 6.0–8.5)

## 2017-05-29 LAB — VITAMIN D 25 HYDROXY (VIT D DEFICIENCY, FRACTURES): VIT D 25 HYDROXY: 32.3 ng/mL (ref 30.0–100.0)

## 2017-06-10 ENCOUNTER — Other Ambulatory Visit: Payer: Medicare HMO

## 2017-06-10 DIAGNOSIS — Z1211 Encounter for screening for malignant neoplasm of colon: Secondary | ICD-10-CM | POA: Diagnosis not present

## 2017-06-12 LAB — FECAL OCCULT BLOOD, IMMUNOCHEMICAL: Fecal Occult Bld: NEGATIVE

## 2017-06-29 ENCOUNTER — Telehealth: Payer: Self-pay | Admitting: Family Medicine

## 2017-06-29 NOTE — Telephone Encounter (Signed)
Forwarding to Dr Christell Constant as an [place]. Facility called as a notification only. No action needed at this time.

## 2017-08-13 ENCOUNTER — Telehealth: Payer: Self-pay | Admitting: *Deleted

## 2017-08-13 NOTE — Telephone Encounter (Signed)
Spoke with Onc[facility]anie at [facility]

## 2017-08-13 NOTE — Telephone Encounter (Signed)
Just watch and see if reoccurs

## 2017-08-13 NOTE — Telephone Encounter (Signed)
Pt on Eliquis 5 mg 1 BID Woke up this monring with a nose bleed that was 'pouring' 'running' down towards ears with a large clot about a 'quarter' in size c/o of HA BP 122/84, no new bruises or falls

## 2017-09-28 ENCOUNTER — Emergency Department (HOSPITAL_COMMUNITY)
Admission: EM | Admit: 2017-09-28 | Discharge: 2017-09-28 | Disposition: A | Discharge status: Home / Self Care | Payer: Medicare HMO | Attending: Emergency Medicine | Admitting: Emergency Medicine

## 2017-09-28 ENCOUNTER — Encounter (HOSPITAL_COMMUNITY): Payer: Self-pay | Admitting: Emergency Medicine

## 2017-09-28 ENCOUNTER — Other Ambulatory Visit: Payer: Self-pay

## 2017-09-28 DIAGNOSIS — Z79899 Other long term (current) drug therapy: Secondary | ICD-10-CM | POA: Insufficient documentation

## 2017-09-28 DIAGNOSIS — R040 Epistaxis: Secondary | ICD-10-CM | POA: Insufficient documentation

## 2017-09-28 DIAGNOSIS — E785 Hyperlipidemia, unspecified: Secondary | ICD-10-CM | POA: Diagnosis not present

## 2017-09-28 DIAGNOSIS — I10 Essential (primary) hypertension: Secondary | ICD-10-CM | POA: Insufficient documentation

## 2017-09-28 DIAGNOSIS — R58 Hemorrhage, not elsewhere classified: Secondary | ICD-10-CM | POA: Diagnosis not present

## 2017-09-28 MED ORDER — OXYMETAZOLINE HCL 0.05 % NA SOLN
2.0000 | Freq: Once | NASAL | Status: AC
  Administered 2017-09-28: 2 via NASAL
  Filled 2017-09-28: qty 15

## 2017-09-28 MED ORDER — ACETAMINOPHEN 500 MG PO TABS
1000.0000 mg | ORAL_TABLET | Freq: Once | ORAL | Status: AC
Start: 2017-09-28 — End: 2017-09-28
  Administered 2017-09-28: 1000 mg via ORAL
  Filled 2017-09-28: qty 2

## 2017-09-28 MED ORDER — SILVER NITRATE-POT NITRATE 75-25 % EX MISC
CUTANEOUS | Status: AC
  Filled 2017-09-28: qty 1

## 2017-09-28 NOTE — ED Notes (Signed)
Pt's son arrived.  Updated on condition. No bleeding noted at this time.

## 2017-09-28 NOTE — ED Provider Notes (Signed)
[facility] EMERGENCY DEPARTMENT Provider Note   CSN: [id number] Arrival date & time: 09/28/17  0865     History   Chief Complaint Chief Complaint  Patient presents with  . Epistaxis    HPI Vickie Hall is a 84 y.o. female.  She presents for evaluation of epistaxis, which started this morning, without known trauma or other difficulty.  She denies headache, chest pain or back pain.  She is a somewhat poor historian.  There are no other known modifying factors.  HPI  Past Medical History:  Diagnosis Date  . GERD (gastroesophageal reflux disease)   . Hyperlipidemia   . Hypertension   . Memory deficit   . Osteoporosis     Patient Active Problem List   Diagnosis Date Noted  . Foley catheter in place 11/12/2016  . Closed pertrochanteric fracture of femur, left, initial encounter ([facility]) 10/29/2016  . Closed left hip fracture ([facility]) 10/28/2016  . Dementia due to Alzheimer's disease 10/28/2016  . Aortic atherosclerosis ([facility]) 05/29/2016  . Osteoporosis, senile 12/16/2013  . GERD (gastroesophageal reflux disease) 11/11/2013  . HTN (hypertension) 11/11/2013  . Hyperlipidemia 11/11/2013    Past Surgical History:  Procedure Laterality Date  . ABDOMINAL HYSTERECTOMY    . EYE SURGERY    . INTRAMEDULLARY (IM) NAIL INTERTROCHANTERIC Left 10/29/2016   Procedure: INTRAMEDULLARY (IM) NAIL INTERTROCHANTRIC;  Surgeon: Samson Frederic, MD;  Location: [facility] OR;  Service: Orthopedics;  Laterality: Left;  . ORIF WRIST FRACTURE Left 11/01/2016   Procedure: OPEN REDUCTION INTERNAL FIXATION (ORIF) WRIST FRACTURE;  Surgeon: Dominica Severin, MD;  Location: [facility] OR;  Service: Orthopedics;  Laterality: Left;    OB History    No data available       Home Medications    Prior to Admission medications   Medication Sig Start Date End Date Taking? Authorizing Provider  acetaminophen (TYLENOL) 325 MG tablet Take 2 tablets (650 mg total) by mouth every 6 (six) hours as needed for mild pain (or Fever  >/= 101). 11/05/16  Yes Elgergawy, Leana Roe, MD  apixaban (ELIQUIS) 5 MG TABS tablet Take 1 tablet (5 mg total) by mouth 2 (two) times daily. 05/28/17  Yes Ernestina Penna, MD  diphenhydrAMINE (BENADRYL) 25 MG tablet Take 25 mg by mouth every 4 (four) hours as needed for itching, allergies or sleep.   Yes [provider]  ezetimibe (ZETIA) 10 MG tablet Take 10 mg by mouth daily.   Yes [provider]  ferrous sulfate 325 (65 FE) MG tablet Take 1 tablet (325 mg total) by mouth daily with breakfast. 11/05/16  Yes Elgergawy, Leana Roe, MD  HYDROcodone-acetaminophen (NORCO/VICODIN) 5-325 MG tablet Take 1 tablet by mouth every 6 (six) hours as needed for moderate pain. 11/05/16  Yes Elgergawy, Leana Roe, MD  Memantine HCl-Donepezil HCl (NAMZARIC) 28-10 MG CP24 Take 1 tablet by mouth at bedtime. 07/07/14  Yes Ernestina Penna, MD  senna-docusate (SENOKOT-S) 8.6-50 MG tablet Take 2 tablets by mouth 2 (two) times daily. 11/05/16  Yes Elgergawy, Leana Roe, MD  valsartan (DIOVAN) 160 MG tablet Take 160 mg by mouth daily.  08/29/16  Yes [provider]  Vitamin D, Ergocalciferol, (DRISDOL) 50000 UNITS CAPS capsule Take 1 capsule (50,000 Units total) by mouth every 7 (seven) days. 07/08/14   Ernestina Penna, MD    Family History Family History  Family history unknown: Yes    Social History Social History   Tobacco Use  . Smoking status: Never Smoker  . Smokeless tobacco:  Never Used  Substance Use Topics  . Alcohol use: No  . Drug use: No     Allergies   Actonel [risedronate sodium] and Lipitor [atorvastatin]   Review of Systems Review of Systems  All other systems reviewed and are negative.    Physical Exam Updated Vital Signs BP (!) 180/84 (BP Location: Left Arm)   Pulse (!) 48   Temp 97.8 F (36.6 C) (Oral)   Resp 16   Ht 5\' 7"  (1.702 m)   Wt 68 kg (150 lb)   SpO2 98%   BMI 23.49 kg/m   Physical Exam  Constitutional: She appears well-developed.  Elderly,  frail  HENT:  Head: Normocephalic and atraumatic.  Large clot right nares, with small amount of active bleeding.  No blood in oropharynx.  Eyes: Conjunctivae and EOM are normal. Pupils are equal, round, and reactive to light.  Neck: Normal range of motion and phonation normal. Neck supple.  Cardiovascular: Normal rate and regular rhythm.  Pulmonary/Chest: Effort normal and breath sounds normal. She exhibits no tenderness.  Abdominal: Soft. She exhibits no distension. There is no tenderness. There is no guarding.  Musculoskeletal: Normal range of motion.  Neurological: She is alert. She exhibits normal muscle tone.  Skin: Skin is warm and dry.  Psychiatric: She has a normal mood and affect. Her behavior is normal.  Nursing note and vitals reviewed.    ED Treatments / Results  Labs (all labs ordered are listed, but only abnormal results are displayed) Labs Reviewed - No data to display  EKG  EKG Interpretation None       Radiology No results found.  Procedures .Epistaxis Management Date/Time: 09/28/2017 1:39 PM Performed by: Mancel Bale, MD Authorized by: Mancel Bale, MD   Consent:    Consent obtained:  Verbal   Consent given by:  Patient Anesthesia (see MAR for exact dosages):    Anesthesia method:  None Procedure details:    Treatment site:  R anterior   Treatment method:  Silver nitrate   Treatment complexity:  Limited   Treatment episode: initial   Post-procedure details:    Assessment:  Bleeding decreased   Patient tolerance of procedure:  Tolerated well, no immediate complications   (including critical care time)  Medications Ordered in ED Medications  silver nitrate applicators 75-25 % applicator (not administered)  oxymetazoline (AFRIN) 0.05 % nasal spray 2 spray (2 sprays Right Nare Given 09/28/17 1050)  acetaminophen (TYLENOL) tablet 1,000 mg (1,000 mg Oral Given 09/28/17 1223)     Initial Impression / Assessment and Plan / ED Course  I have  reviewed the triage vital signs and the nursing notes.  Pertinent labs & imaging results that were available during my care of the patient were reviewed by me and considered in my medical decision making (see chart for details).  Clinical Course as of Sep 28 1337  Sat Sep 28, 2017  1258 After blood clot expelled from the right nares, there was an area of the right anterior septal wall, which is notable for a small amount of active bleeding.  This area was treated with silver nitrate  [EW]    Clinical Course User Index [EW] Mancel Bale, MD     Patient Vitals for the past 24 hrs:  BP Temp Temp src Pulse Resp SpO2 Height Weight  09/28/17 1238 (!) 180/84 - - (!) 48 16 98 % - -  09/28/17 0939 (!) 179/80 97.8 F (36.6 C) Oral (!) 42 18 98 % - -  09/28/17 0936 - - - - - - 5\' 7"  (1.702 m) 68 kg (150 lb)    At discharge- reevaluation with update and discussion. After initial assessment and treatment, an updated evaluation reveals patient is comfortable, bleeding controlled.  Findings discussed with patient and family members, all questions answered. Mancel BaleElliott Aylssa Herrig     Final Clinical Impressions(s) / ED Diagnoses   Final diagnoses:  Right-sided epistaxis   Right nares bleeding, likely secondary to mild trauma of the anterior symptoms.  Mild hypertension not likely to be causative.  Patient is anticoagulated on Eliquis.  She is hemodynamically stable.  No indication for further treatment in the ED or hospitalization at this time.  Nursing Notes Reviewed/ Care Coordinated Applicable Imaging Reviewed Interpretation of Laboratory Data incorporated into ED treatment  The patient appears reasonably screened and/or stabilized for discharge and I doubt any other medical condition or other [facility] requiring further screening, evaluation, or treatment in the ED at this time prior to discharge.  Plan: Home Medications-continue current medications; Home Treatments-rest, fluids; return here if the  recommended treatment, does not improve the symptoms; Recommended follow up-PCP, as needed   ED Discharge Orders    None       Mancel BaleWentz, Aariz Maish, MD 09/28/17 1341

## 2017-09-28 NOTE — Discharge Instructions (Signed)
For further episodes of nosebleeding, pinch the nose firmly, and use an ice pack on the area above where you are pinching, to help slow down bleeding.  To help decrease bleeding in the future, use some Vaseline ointment on the inside of the nose 2 or 3 times a day.  Return here, if needed, for problems.

## 2017-09-28 NOTE — ED Notes (Signed)
[facility] to [facility].

## 2017-09-28 NOTE — ED Notes (Signed)
Pt cleaned of incontinent urine prior to leaving.  Taken out via WC.  Instructions [facility] to [facility].

## 2017-09-28 NOTE — ED Triage Notes (Signed)
Per EMS, pt complain of epistaxis since this am. Pt is on blood thinner. Denies any known injury. No active bleeding noted at this time.

## 2017-09-28 NOTE — ED Notes (Signed)
Pt unable to fully blow nose to clear nares.

## 2017-10-01 DIAGNOSIS — S52611A Displaced fracture of right ulna styloid process, initial encounter for closed fracture: Secondary | ICD-10-CM | POA: Diagnosis not present

## 2017-10-01 DIAGNOSIS — M25511 Pain in right shoulder: Secondary | ICD-10-CM | POA: Diagnosis not present

## 2017-10-01 DIAGNOSIS — S0083XA Contusion of other part of head, initial encounter: Secondary | ICD-10-CM | POA: Diagnosis not present

## 2017-10-01 DIAGNOSIS — N3000 Acute cystitis without hematuria: Secondary | ICD-10-CM | POA: Diagnosis not present

## 2017-10-01 DIAGNOSIS — E7800 Pure hypercholesterolemia, unspecified: Secondary | ICD-10-CM | POA: Diagnosis not present

## 2017-10-01 DIAGNOSIS — S0990XA Unspecified injury of head, initial encounter: Secondary | ICD-10-CM | POA: Diagnosis not present

## 2017-10-01 DIAGNOSIS — M25531 Pain in right wrist: Secondary | ICD-10-CM | POA: Diagnosis not present

## 2017-10-01 DIAGNOSIS — I10 Essential (primary) hypertension: Secondary | ICD-10-CM | POA: Diagnosis not present

## 2017-10-01 DIAGNOSIS — F0390 Unspecified dementia without behavioral disturbance: Secondary | ICD-10-CM | POA: Diagnosis not present

## 2017-10-01 DIAGNOSIS — R001 Bradycardia, unspecified: Secondary | ICD-10-CM | POA: Diagnosis not present

## 2017-10-01 DIAGNOSIS — S0003XA Contusion of scalp, initial encounter: Secondary | ICD-10-CM | POA: Diagnosis not present

## 2017-10-01 DIAGNOSIS — T148XXA Other injury of unspecified body region, initial encounter: Secondary | ICD-10-CM | POA: Diagnosis not present

## 2017-10-01 DIAGNOSIS — S51811A Laceration without foreign body of right forearm, initial encounter: Secondary | ICD-10-CM | POA: Diagnosis not present

## 2017-10-01 DIAGNOSIS — S62101A Fracture of unspecified carpal bone, right wrist, initial encounter for closed fracture: Secondary | ICD-10-CM | POA: Diagnosis not present

## 2017-10-01 DIAGNOSIS — S52501A Unspecified fracture of the lower end of right radius, initial encounter for closed fracture: Secondary | ICD-10-CM | POA: Diagnosis not present

## 2017-10-01 DIAGNOSIS — S4991XA Unspecified injury of right shoulder and upper arm, initial encounter: Secondary | ICD-10-CM | POA: Diagnosis not present

## 2017-10-01 DIAGNOSIS — S199XXA Unspecified injury of neck, initial encounter: Secondary | ICD-10-CM | POA: Diagnosis not present

## 2017-10-01 DIAGNOSIS — W010XXA Fall on same level from slipping, tripping and stumbling without subsequent striking against object, initial encounter: Secondary | ICD-10-CM | POA: Diagnosis not present

## 2017-10-02 ENCOUNTER — Telehealth: Payer: Self-pay | Admitting: Family Medicine

## 2017-10-02 DIAGNOSIS — R279 Unspecified lack of coordination: Secondary | ICD-10-CM | POA: Diagnosis not present

## 2017-10-02 DIAGNOSIS — Z743 Need for continuous supervision: Secondary | ICD-10-CM | POA: Diagnosis not present

## 2017-10-02 DIAGNOSIS — S6291XA Unspecified fracture of right wrist and hand, initial encounter for closed fracture: Secondary | ICD-10-CM | POA: Diagnosis not present

## 2017-10-02 NOTE — Telephone Encounter (Signed)
**  [facility] After Hours/ Emergency Line Call**  Patient: Vickie SnowballHelen M Hall.  PCP: Ernestina PennaMoore, Donald W, MD  Called by a member of the staff at the assisted living facility who notes that patient fell onto her right side hitting her head.  She is currently being cared for in the Alzheimer's section of the facility and is not typically oriented.  I recommended given her age and known injury to the head that she be evaluated in the emergency department.  They would likely need to obtain a CT scan head and image any other affected areas.  Caregiver voiced good understanding and will prepare the patient transported by EMS.  Will forward to PCP.  Yaslin Kirtley M. Nadine CountsGottschalk, DO

## 2017-10-03 ENCOUNTER — Encounter: Payer: Self-pay | Admitting: Family

## 2017-10-03 ENCOUNTER — Ambulatory Visit (INDEPENDENT_AMBULATORY_CARE_PROVIDER_SITE_OTHER): Payer: Medicare HMO | Admitting: Family

## 2017-10-03 VITALS — BP 164/86 | HR 49 | Temp 96.7°F

## 2017-10-03 DIAGNOSIS — W06XXXD Fall from bed, subsequent encounter: Secondary | ICD-10-CM | POA: Diagnosis not present

## 2017-10-03 DIAGNOSIS — S52614G Nondisplaced fracture of right ulna styloid process, subsequent encounter for closed fracture with delayed healing: Secondary | ICD-10-CM | POA: Diagnosis not present

## 2017-10-03 DIAGNOSIS — F0280 Dementia in other diseases classified elsewhere without behavioral disturbance: Secondary | ICD-10-CM

## 2017-10-03 DIAGNOSIS — G309 Alzheimer's disease, unspecified: Secondary | ICD-10-CM

## 2017-10-03 NOTE — Patient Instructions (Signed)
Ulnar Fracture An ulnar fracture is a break in the ulna bone, which is the forearm bone that is located on the same side as your little finger. Your forearm is the part of your arm that is between your elbow and your wrist. It is made up of two bones: the radius and ulna. The ulna forms the point of your elbow at its upper end. The lower end can be felt on the outside of your wrist. An ulnar fracture can happen near the wrist or elbow or in the middle of your forearm. Middle forearm fractures usually break both the radius and the ulna. What are the causes? A heavy, direct blow to the forearm is the most common cause of an ulnar fracture. It takes a lot of force to break a bone in your forearm. This type of injury may be caused by:  An accident, such as a car or bike accident.  Falling with your arm outstretched.  What increases the risk? You may be at greater risk for an ulnar fracture if you:  Play contact sports.  Have a condition that causes your bones to be weak or thin (osteoporosis).  What are the signs or symptoms? An ulnar fracture causes pain immediately after the injury. You may need to support your forearm with your other hand. Other signs and symptoms include:  An abnormal bend or bump in your arm (deformity).  Swelling.  Bruising.  Numbness or weakness in your hand.  Inability to turn your hand from side to side (rotate).  How is this diagnosed? Your health care provider may diagnose an ulnar fracture based on:  Your symptoms.  Your medical history, including any recent injury.  A physical exam. Your health care provider will look for any deformity and feel for tenderness over the break. Your health care provider will also check whether the bone is out of place.  An X-ray exam to confirm the diagnosis and learn more about the type of fracture.  How is this treated? The goals of treatment are to get the bone in proper position for healing and to keep it from  moving so it will heal over time. Your treatment will depend on many factors, especially the type of fracture that you have.  If the fractured bone: ? Is in the correct position (nondisplaced), you may only need to wear a cast or a splint. ? Has a slightly displaced fracture, you may need to have the bones moved back into place manually (closed reduction) before the splint or cast is put on.  You may have a temporary splint before you have a plaster cast. The splint allows room for some swelling. After a few days, a cast can replace the splint. ? You may have to wear the cast for about 6 weeks or as directed by your health care provider. ? The cast may be changed after about 3 weeks or as directed by your health care provider.  After your cast is taken off, you may need physical therapy to regain full movement in your wrist or elbow.  You may need emergency surgery if you have: ? A fractured bone that is out of position (displaced). ? A fracture with multiple fragments (comminuted fracture). ? A fracture that breaks the skin (open fracture). This type of fracture may require surgical wires, plates, or screws to hold the bone in place.  You may have X-rays every couple of weeks to check on your healing.  Follow these instructions at home:   If you have a cast:  Do not stick anything inside the cast to scratch your skin. Doing that increases your risk of infection.  Do not put pressure on any part of the cast until it is fully hardened. This may take several hours.  Check the skin around the cast every day. Report any concerns to your health care provider. You may put lotion on dry skin around the edges of the cast. Do not apply lotion to the skin underneath the cast.  Do not let your cast get wet if it is not waterproof.  Keep the cast clean. If you have a splint:  Wear it as told by your health care provider. Remove it only as told by your health care provider.  Do not put pressure  on any part of the splint until it is fully hardened. This may take several hours.  Loosen the splint if your fingers become numb and tingle, or if they turn cold and blue.  Do not let your splint get wet if it is not waterproof.  Keep the splint clean. Bathing  Do not take baths, swim, or use a hot tub until your health care provider approves. Ask your health care provider if you can take showers. You may only be allowed to take sponge baths for bathing.  If your splint is not waterproof, protect it with a watertight covering when you take a bath or a shower. Managing pain, stiffness, and swelling  If directed, apply ice to the injured area: ? Put ice in a plastic bag. ? Place a towel between your skin and the bag. ? Leave the ice on for 20 minutes, 2-3 times a day.  Move your fingers often to avoid stiffness and to lessen swelling.  Raise the injured area above the level of your heart while you are sitting or lying down. Driving  Do not drive or operate heavy machinery while taking pain medicine.  Do not drive while wearing a cast or splint on a hand that you use for driving. Activity  Return to your normal activities as directed by your health care provider. Ask your health care provider what activities are safe for you.  Do exercises as told by your health care provider. General instructions  Do not use your injured limb to support your body weight until your health care provider says that you can.  Do not use any tobacco products, including cigarettes, chewing tobacco, or electronic cigarettes. Tobacco can delay bone healing. If you need help quitting, ask your health care provider.  Take over-the-counter and prescription medicines only as told by your health care provider.  Keep all follow-up visits as told by your health care provider. This is important. Contact a health care provider if:  Your pain medicine is not helping.  Your cast gets damaged or it  breaks.  Your cast becomes loose.  Your cast gets wet.  You have more severe pain or swelling than you did before the cast.  You have severe pain when stretching your fingers.  You continue to have pain or stiffness in your elbow or your wrist after your cast is taken off. Get help right away if:  You cannot move your fingers.  You lose feeling in your fingers or your hand.  Your hand or your fingers turn cold and pale or blue.  You notice a bad smell coming from your cast.  You have drainage from underneath your cast.  You have new stains from blood or   drainage seeping through your cast. This information is not intended to replace advice given to you by your health care provider. Make sure you discuss any questions you have with your health care provider. Document Released: 02/14/2006 Document Revised: 08/01/2016 Document Reviewed: 02/10/2014 Elsevier Interactive Patient Education  2017 [employer] Inc[employer]  

## 2017-10-03 NOTE — Progress Notes (Addendum)
   Subjective:    Patient ID: Vickie SnowballHelen M Hall, female    DOB: 1928/06/17, 84 y.o.   MRN: [id number]  PT presents to the office today to recheck her right wrist. PT fell out of bed on 09/28/17 and went to the Urgent Care. PT had displaced fracture of the ulnar-styloid. Pt has Ortho appt 10/14/17.    PT states she is hurting "bad" and a 10 out 10.  Fall  The accident occurred 5 to 7 days ago. The fall occurred from a bed. The point of impact was the face and right wrist. The pain is present in the right wrist. The pain is at a severity of 10/10. The pain is moderate. She has tried rest (Norco) for the symptoms. The treatment provided mild relief.      Review of Systems  Musculoskeletal: Positive for arthralgias.  Skin: Positive for wound.  All other systems reviewed and are negative.      Objective:   Physical Exam  Constitutional: She is oriented to person, place, and time. She appears well-developed and well-nourished. No distress.  HENT:  Head: Normocephalic.  Eyes: Pupils are equal, round, and reactive to light.  Neck: Normal range of motion. Neck supple. No thyromegaly present.  Cardiovascular: Normal rate, regular rhythm, normal heart sounds and intact distal pulses.  No murmur heard. Pulmonary/Chest: Effort normal and breath sounds normal. No respiratory distress. She has no wheezes.  Abdominal: Soft. Bowel sounds are normal. She exhibits no distension. There is no tenderness.  Musculoskeletal: She exhibits tenderness. She exhibits no edema.  Neurological: She is alert and oriented to person, place, and time.  Skin: Skin is warm and dry. Ecchymosis (around right forehead and around right orbital, right wrist) noted.  Psychiatric: She has a normal mood and affect. Her behavior is normal. Judgment and thought content normal.  Vitals reviewed.     BP (!) 168/83   Pulse (!) 49   Temp (!) 96.7 F (35.9 C) (Oral)     Assessment & Plan:  1. Dementia due to Alzheimer's  disease  2. Fall from bed, subsequent encounter  3. Closed nondisplaced fracture of styloid process of right ulna with delayed healing, subsequent encounter   Keep Ortho appt, but since patient is in such a great deal of pain we will try to get an earlier  ortho appointment Keep wrist elevated Continue Norco as needed for pain Fall prevention discussed RTO prn  And keep follow up with PCP    Jannifer Rodneyhristy Hawks, FNP

## 2017-10-04 ENCOUNTER — Encounter (INDEPENDENT_AMBULATORY_CARE_PROVIDER_SITE_OTHER): Payer: Self-pay | Admitting: Orthopaedic Surgery

## 2017-10-04 ENCOUNTER — Ambulatory Visit (INDEPENDENT_AMBULATORY_CARE_PROVIDER_SITE_OTHER): Payer: Medicare HMO | Admitting: Orthopaedic Surgery

## 2017-10-04 VITALS — BP 121/68 | HR 47

## 2017-10-04 DIAGNOSIS — S62101A Fracture of unspecified carpal bone, right wrist, initial encounter for closed fracture: Secondary | ICD-10-CM

## 2017-10-04 NOTE — Progress Notes (Signed)
Office Visit Note   Patient: Vickie Hall           Date of Birth: 1928/05/15           MRN: [id number] Visit Date: 10/04/2017              Requested by: Ernestina Penna, MD [address] [place], [place] [address] PCP: Ernestina Penna, MD   Assessment & Plan: Visit Diagnoses:  1. Closed fracture of right wrist, initial encounter     Plan: Nondisplaced right distal radius fracture. We will treat in long volar wrist splint and follow-up in 2 weeks for repeat films. Laceration right elbow appears to be stable and will be evaluated by the physician at the nursing facility or by me in the next several weeks  Follow-Up Instructions: Return in about 2 weeks (around 10/18/2017).   Orders:  No orders of the defined types were placed in this encounter.  No orders of the defined types were placed in this encounter.     Procedures: No procedures performed   Clinical Data: No additional findings.   Subjective: Chief Complaint  Patient presents with  . Right Wrist - Fracture  . Wrist Injury    Right wrist fracture, fall, 09/29/17  Vickie Hall has a history of dementia and lives at [facility] nursing facility in [place]. She apparently had fallen out of her bed several days ago complaining of right wrist pain. She was seen at [facility] with x-rays demonstrating a nondisplaced distal radius fracture. She was placed in a volar wrist splint and asked to follow-up for orthopedic evaluation. She also sustained a laceration about the right elbow laterally that was treated with Steri-Strips.. She's been comfortable without any sequelae. I reviewed the films on the PACS system from [facility]. There is a nondisplaced right distal radius fracture at the metaphysis. No evidence of an injury to the distal ulna. Carpus appears to be intact.  HPI  Review of Systems   Objective: Vital Signs: BP 121/68 (BP Location: Left Arm, Patient Position: Sitting, Cuff Size: Normal)   Pulse  (!) 47   Physical Exam  Ortho Exam laceration about the lateral aspect of her right elbow is treated with Steri-Strips. No drainage and no evidence of infection. No pain with range of motion of the elbow. Diffuse ecchymosis of her right hand. Skin intact. Pain over the distal radius but no obvious deformity. Neurovascular exam appears to be intact.  Specialty Comments:  No specialty comments available.  Imaging: No results found.   PMFS History: Patient Active Problem List   Diagnosis Date Noted  . Closed fracture of right wrist 10/04/2017  . Foley catheter in place 11/12/2016  . Closed pertrochanteric fracture of femur, left, initial encounter (HCC) 10/29/2016  . Closed left hip fracture (HCC) 10/28/2016  . Dementia due to Alzheimer's disease 10/28/2016  . Aortic atherosclerosis (HCC) 05/29/2016  . Osteoporosis, senile 12/16/2013  . GERD (gastroesophageal reflux disease) 11/11/2013  . HTN (hypertension) 11/11/2013  . Hyperlipidemia 11/11/2013   Past Medical History:  Diagnosis Date  . GERD (gastroesophageal reflux disease)   . Hyperlipidemia   . Hypertension   . Memory deficit   . Osteoporosis     Family History  Family history unknown: Yes    Past Surgical History:  Procedure Laterality Date  . ABDOMINAL HYSTERECTOMY    . EYE SURGERY    . INTRAMEDULLARY (IM) NAIL INTERTROCHANTERIC Left 10/29/2016   Procedure: INTRAMEDULLARY (IM) NAIL INTERTROCHANTRIC;  Surgeon:  Samson FredericBrian Swinteck, MD;  [facility];  Service: Orthopedics;  Laterality: Left;  . ORIF WRIST FRACTURE Left 11/01/2016   Procedure: OPEN REDUCTION INTERNAL FIXATION (ORIF) WRIST FRACTURE;  Surgeon: Dominica SeverinGramig, William, MD;  Location: [facility] OR;  Service: Orthopedics;  Laterality: Left;   Social History   Occupational History  . Not on file  Tobacco Use  . Smoking status: Never Smoker  . Smokeless tobacco: Never Used  Substance and Sexual Activity  . Alcohol use: No  . Drug use: No  . Sexual activity: Not on  file     Valeria BatmanPeter W Jaxiel Kines, MD   Note - This record has been created using [employer] software.  Chart creation errors have been sought, but may not always  have been located. Such creation errors do not reflect on  the standard of medical care.

## 2017-10-10 ENCOUNTER — Encounter: Payer: Self-pay | Admitting: Family Medicine

## 2017-10-10 ENCOUNTER — Ambulatory Visit (INDEPENDENT_AMBULATORY_CARE_PROVIDER_SITE_OTHER): Payer: Medicare HMO | Admitting: Family Medicine

## 2017-10-10 VITALS — BP 135/86 | HR 80 | Temp 96.7°F | Ht 62.0 in | Wt 153.6 lb

## 2017-10-10 DIAGNOSIS — W06XXXD Fall from bed, subsequent encounter: Secondary | ICD-10-CM | POA: Diagnosis not present

## 2017-10-10 DIAGNOSIS — R413 Other amnesia: Secondary | ICD-10-CM | POA: Diagnosis not present

## 2017-10-10 DIAGNOSIS — F0280 Dementia in other diseases classified elsewhere without behavioral disturbance: Secondary | ICD-10-CM | POA: Diagnosis not present

## 2017-10-10 DIAGNOSIS — E7800 Pure hypercholesterolemia, unspecified: Secondary | ICD-10-CM

## 2017-10-10 DIAGNOSIS — S0511XA Contusion of eyeball and orbital tissues, right eye, initial encounter: Secondary | ICD-10-CM | POA: Diagnosis not present

## 2017-10-10 DIAGNOSIS — G309 Alzheimer's disease, unspecified: Secondary | ICD-10-CM | POA: Diagnosis not present

## 2017-10-10 DIAGNOSIS — S62101A Fracture of unspecified carpal bone, right wrist, initial encounter for closed fracture: Secondary | ICD-10-CM | POA: Diagnosis not present

## 2017-10-10 DIAGNOSIS — I10 Essential (primary) hypertension: Secondary | ICD-10-CM

## 2017-10-10 DIAGNOSIS — E559 Vitamin D deficiency, unspecified: Secondary | ICD-10-CM | POA: Diagnosis not present

## 2017-10-10 DIAGNOSIS — D508 Other iron deficiency anemias: Secondary | ICD-10-CM | POA: Diagnosis not present

## 2017-10-10 NOTE — Patient Instructions (Addendum)
Continue current medications. Continue good therapeutic lifestyle changes which include good diet and exercise. Fall precautions discussed with patient. If an FOBT was given today- please return it to our front desk. If you are over 82 years old - you may need Prevnar 13 or the adult Pneumonia vaccine.  **Flu shots are available--- please call and schedule a FLU-CLINIC appointment**  After your visit with us today you will receive a survey in the mail or online from [employer] regarding your care with us. Please take a moment to fill this out. Your feedback is very important to us as you can help us better understand your patient needs as well as improve your experience and satisfaction. WE CARE ABOUT YOU!!!   Monitor and encourage fall prevention as much as possible Change dressing to right elbow with Betadine wet-to-dry daily until return visit with orthopedist Follow-up with orthopedist as planned We will send a copy of lab work to the facility once that lab work is returned

## 2017-10-10 NOTE — Progress Notes (Signed)
Subjective:    Patient ID: FLARA STORTI, female    DOB: Aug 03, 1928, 84 y.o.   MRN: [id number]  HPI  Pt is here today for follow up of chronic medical condiotns which include Alzheimer's, hyperlipidemia and hypertension. Pt also had a recent fall.  On January 12 she had a right sided nose bleed and was treated for this by the emergency room physician at [facility].  She also had a closed fracture of the right wrist secondary to a fall in and on January 18 of this year.  There is a nondisplaced right distal radius fracture.  She was placed in a volar wrist splint and was to be followed up in a couple weeks with repeat films by the orthopedic surgeon.  She is still a resident at the assisted living facility.  Her vital signs today are stable and she will get lab work today.The patient also had a skin tear of the right elbow.Patient also had a skin tear of the right elbow.  The patient also had a skin tear of the right elbow.  This will be clean and a Betadine wet-to-dry dressing will be applied .The patient and caregiver deny any chest pain or shortness of breath.  There is no problem with nausea vomiting diarrhea blood in the stool or black tarry bowel movements.       Review of Systems  Skin: Positive for color change (bruising R side of face due to recent fall).  Psychiatric/Behavioral: Positive for confusion.       Objective:   Physical Exam  Constitutional: She appears well-developed and well-nourished.  Patient is elderly and confused but the depth of confusion does not seem to be any worse than usual.  She is sustained a recent fall and has a facial contusion along with a fracture of the right wrist.  She seems to be tolerating this well in the wheelchair.  HENT:  Right Ear: External ear normal.  Left Ear: External ear normal.  Nose: Nose normal.  Mouth/Throat: Oropharynx is clear and moist.  She has a right orbital contusion.  Secondary to the fall.  Eyes: Conjunctivae and EOM  are normal. Pupils are equal, round, and reactive to light. Right eye exhibits no discharge. Left eye exhibits no discharge. No scleral icterus.  Neck: Normal range of motion. Neck supple. No thyromegaly present.  Cardiovascular: Normal rate, regular rhythm and normal heart sounds.  No murmur heard. Heart is slightly irregular at 84/min  Pulmonary/Chest: Effort normal. No respiratory distress. She has no wheezes. She has no rales.  Basilar atelectasis bilaterally  Abdominal: Soft. Bowel sounds are normal. She exhibits no mass. There is tenderness. There is no rebound and no guarding.  Slight epigastric tenderness and slight right lower quadrant tenderness.  Musculoskeletal: She exhibits tenderness. She exhibits no edema.  The patient is confined to the wheelchair and does not walk.  She has a wrist splint on the right wrist because of her closed fracture.  Lymphadenopathy:    She has no cervical adenopathy.  Neurological: She is alert.  The patient does answer a few questions appropriately but her memory is greatly diminished.  Skin: Skin is warm and dry. No rash noted.  Contusion to right periorbital region and right lateral elbow.  Psychiatric: She has a normal mood and affect. Her behavior is normal. Judgment and thought content normal.  Nursing note and vitals reviewed.  BP 135/86 (BP Location: Left Arm, Patient Position: Sitting, Cuff Size: Normal)   Pulse  80   Temp (!) 96.7 F (35.9 C) (Oral)   Ht 5\' 2"  (1.575 m)   Wt 153 lb 9.6 oz (69.7 kg)   BMI 28.09 kg/m         Assessment & Plan:  1. Essential hypertension -The patient and caregiver deny any chest pain or shortness of breath.  There is no problem with nausea vomiting diarrhea blood in the stool or black tarry bowel movements.The blood pressure is good today and she will continue with current treatment - Basic Metabolic Panel - Hepatic function panel  2. Pure hypercholesterolemia -Continue with current treatment  pending results of lab work - Lipid panel  3. Other iron deficiency anemia - CBC with Differential/Platelet  4. Memory deficit -Continue with current treatment  5. Dementia due to Alzheimer's disease -This appears to be stable and she will continue with current treatment  6. Fall from bed, subsequent encounter -Monitor patient closely and prevent future falls  7. Vitamin D deficiency -Continue with current treatment  8. Closed fracture of right wrist, initial encounter -Follow-up with orthopedist as planned  9. Contusion of right orbital tissues, initial encounter -Follow-up with orthopedist as planned  Patient Instructions  Continue current medications. Continue good therapeutic lifestyle changes which include good diet and exercise. Fall precautions discussed with patient. If an FOBT was given today- please return it to our front desk. If you are over 82 years old - you may need Prevnar 13 or the adult Pneumonia vaccine.  **Flu shots are available--- please call and schedule a FLU-CLINIC appointment**  After your visit with us today you will receive a survey in the mail or online from [employer] regarding your care with us. Please take a moment to fill this out. Your feedback is very important to us as you can help us better understand your patient needs as well as improve your experience and satisfaction. WE CARE ABOUT YOU!!!   Monitor and encourage fall prevention as much as possible Change dressing to right elbow with Betadine wet-to-dry daily until return visit with orthopedist Follow-up with orthopedist as planned We will send a copy of lab work to the facility once that lab work is returned  Nyra Capeson W. Moore MD

## 2017-10-11 LAB — BASIC METABOLIC PANEL
BUN/Creatinine Ratio: 16 (ref 12–28)
BUN: 12 mg/dL (ref 8–27)
CALCIUM: 9 mg/dL (ref 8.7–10.3)
CO2: 24 mmol/L (ref 20–29)
CREATININE: 0.77 mg/dL (ref 0.57–1.00)
Chloride: 103 mmol/L (ref 96–106)
GFR calc Af Amer: 80 mL/min/{1.73_m2} (ref >59)
GFR calc non Af Amer: 70 mL/min/{1.73_m2} (ref >59)
GLUCOSE: 103 mg/dL — AB (ref 65–99)
Potassium: 3.7 mmol/L (ref 3.5–5.2)
SODIUM: 144 mmol/L (ref 134–144)

## 2017-10-11 LAB — CBC WITH DIFFERENTIAL/PLATELET
BASOS ABS: 0.1 10*3/uL (ref 0.0–0.2)
Basos: 1 %
EOS (ABSOLUTE): 0.2 10*3/uL (ref 0.0–0.4)
EOS: 3 %
HEMATOCRIT: 40.3 % (ref 34.0–46.6)
HEMOGLOBIN: 12.9 g/dL (ref 11.1–15.9)
IMMATURE GRANS (ABS): 0 10*3/uL (ref 0.0–0.1)
Immature Granulocytes: 0 %
Lymphocytes Absolute: 2.7 10*3/uL (ref 0.7–3.1)
Lymphs: 31 %
MCH: 30.8 pg (ref 26.6–33.0)
MCHC: 32 g/dL (ref 31.5–35.7)
MCV: 96 fL (ref 79–97)
MONOCYTES: 9 %
Monocytes Absolute: 0.8 10*3/uL (ref 0.1–0.9)
Neutrophils Absolute: 4.8 10*3/uL (ref 1.4–7.0)
Neutrophils: 56 %
Platelets: 342 10*3/uL (ref 150–379)
RBC: 4.19 x10E6/uL (ref 3.77–5.28)
RDW: 14.2 % (ref 12.3–15.4)
WBC: 8.5 10*3/uL (ref 3.4–10.8)

## 2017-10-11 LAB — LIPID PANEL
CHOLESTEROL TOTAL: 211 mg/dL — AB (ref 100–199)
Chol/HDL Ratio: 4.4 ratio (ref 0.0–4.4)
HDL: 48 mg/dL (ref >39)
LDL CALC: 123 mg/dL — AB (ref 0–99)
TRIGLYCERIDES: 201 mg/dL — AB (ref 0–149)
VLDL Cholesterol Cal: 40 mg/dL (ref 5–40)

## 2017-10-11 LAB — HEPATIC FUNCTION PANEL
ALBUMIN: 4.1 g/dL (ref 3.5–4.7)
ALK PHOS: 77 IU/L (ref 39–117)
ALT: 12 IU/L (ref 0–32)
AST: 17 IU/L (ref 0–40)
Bilirubin Total: 0.3 mg/dL (ref 0.0–1.2)
Bilirubin, Direct: 0.1 mg/dL (ref 0.00–0.40)
Total Protein: 6.5 g/dL (ref 6.0–8.5)

## 2017-10-15 NOTE — Progress Notes (Signed)
[facility]. Tried to call 2x reached auto-attendant, then dc'd

## 2017-10-16 ENCOUNTER — Ambulatory Visit (INDEPENDENT_AMBULATORY_CARE_PROVIDER_SITE_OTHER): Payer: Medicare HMO

## 2017-10-16 ENCOUNTER — Encounter (INDEPENDENT_AMBULATORY_CARE_PROVIDER_SITE_OTHER): Payer: Self-pay | Admitting: Orthopaedic Surgery

## 2017-10-16 ENCOUNTER — Ambulatory Visit (INDEPENDENT_AMBULATORY_CARE_PROVIDER_SITE_OTHER): Payer: Medicare HMO | Admitting: Orthopaedic Surgery

## 2017-10-16 VITALS — BP 129/78 | HR 42 | Resp 16 | Ht 62.0 in | Wt 145.0 lb

## 2017-10-16 DIAGNOSIS — M25531 Pain in right wrist: Secondary | ICD-10-CM

## 2017-10-16 NOTE — Progress Notes (Signed)
Office Visit Note   Patient: Vickie Hall           Date of Birth: 03-14-1928           MRN: [id number] Visit Date: 10/16/2017              Requested by: Vickie PennaMoore, Vickie W, MD [address] [place], [place] [id number] PCP: Vickie PennaMoore, Vickie W, MD   Assessment & Plan: Visit Diagnoses:  1. Pain in right wrist     Plan: 3 weeks status post nondisplaced fracture right distal radius. Doing well in splint without any pain.films revealed anatomic position of fracture. We'll continue splint for 2 more weeks. Should not need further films  Follow-Up Instructions: Return in about 2 weeks (around 10/30/2017).   Orders:  Orders Placed This Encounter  Procedures  . XR Wrist Complete Right   No orders of the defined types were placed in this encounter.     Procedures: No procedures performed   Clinical Data: No additional findings.   Subjective: Chief Complaint  Patient presents with  . Right Wrist - Pain    Vickie Hall is an 84 y o for F/U Nondisplaced right distal radius fracture.   presently comfortable. Accompanied by a staff member from nursing facility. Tenderness over the right distal radius. Good grip and relese. No swelling of fingers. Good capillary refill  HPI  Review of Systems  Constitutional: Negative for chills, fatigue and fever.  Eyes: Negative for itching.  Respiratory: Negative for chest tightness and shortness of breath.   Cardiovascular: Negative for chest pain, palpitations and leg swelling.  Gastrointestinal: Negative for blood in stool, constipation and diarrhea.  Endocrine: Negative for polyuria.  Genitourinary: Negative for dysuria.  Musculoskeletal: Negative for back pain, joint swelling, neck pain and neck stiffness.  Allergic/Immunologic: Negative for immunocompromised state.  Neurological: Negative for dizziness, weakness, numbness and headaches.  Hematological: Does not bruise/bleed easily.  Psychiatric/Behavioral: Positive for confusion. Negative  for sleep disturbance. The patient is not nervous/anxious.      Objective: Vital Signs: BP 129/78   Pulse (!) 42   Resp 16   Ht 5\' 2"  (1.575 m)   Wt 145 lb (65.8 kg)   BMI 26.52 kg/m   Physical Exam  Ortho Exam skin intact to right hand and wrist. No swelling of fingers. Good grip and release. Minimal tenderness over the distal radius without deformity  Specialty Comments:  No specialty comments available.  Imaging: Xr Wrist Complete Right  Result Date: 10/16/2017 Films of the right wrist were obtained in 3 projections. There is no change in position of the fracture of the distal radius. There is a sclerotic line consistent with a fracture that appears to be anatomically positioned. Just the base of the thumb are unchanged    PMFS History: Patient Active Problem List   Diagnosis Date Noted  . Closed fracture of right wrist 10/04/2017  . Foley catheter in place 11/12/2016  . Closed pertrochanteric fracture of femur, left, initial encounter (HCC) 10/29/2016  . Closed left hip fracture (HCC) 10/28/2016  . Dementia due to Alzheimer's disease 10/28/2016  . Aortic atherosclerosis (HCC) 05/29/2016  . Osteoporosis, senile 12/16/2013  . GERD (gastroesophageal reflux disease) 11/11/2013  . HTN (hypertension) 11/11/2013  . Hyperlipidemia 11/11/2013   Past Medical History:  Diagnosis Date  . GERD (gastroesophageal reflux disease)   . Hyperlipidemia   . Hypertension   . Memory deficit   . Osteoporosis     Family History  Family  history unknown: Yes    Past Surgical History:  Procedure Laterality Date  . ABDOMINAL HYSTERECTOMY    . EYE SURGERY    . INTRAMEDULLARY (IM) NAIL INTERTROCHANTERIC Left 10/29/2016   Procedure: INTRAMEDULLARY (IM) NAIL INTERTROCHANTRIC;  Surgeon: Vickie Frederic, MD;  Location: [facility] OR;  Service: Orthopedics;  Laterality: Left;  . ORIF WRIST FRACTURE Left 11/01/2016   Procedure: OPEN REDUCTION INTERNAL FIXATION (ORIF) WRIST FRACTURE;  Surgeon: Vickie Severin, MD;  Location: [facility] OR;  Service: Orthopedics;  Laterality: Left;   Social History   Occupational History  . Not on file  Tobacco Use  . Smoking status: Never Smoker  . Smokeless tobacco: Never Used  Substance and Sexual Activity  . Alcohol use: No  . Drug use: No  . Sexual activity: Not on file

## 2017-10-28 DIAGNOSIS — I10 Essential (primary) hypertension: Secondary | ICD-10-CM | POA: Diagnosis not present

## 2017-10-28 DIAGNOSIS — Z79899 Other long term (current) drug therapy: Secondary | ICD-10-CM | POA: Diagnosis not present

## 2017-10-28 DIAGNOSIS — R279 Unspecified lack of coordination: Secondary | ICD-10-CM | POA: Diagnosis not present

## 2017-10-28 DIAGNOSIS — R040 Epistaxis: Secondary | ICD-10-CM | POA: Diagnosis not present

## 2017-10-28 DIAGNOSIS — F0390 Unspecified dementia without behavioral disturbance: Secondary | ICD-10-CM | POA: Diagnosis not present

## 2017-10-28 DIAGNOSIS — Z7902 Long term (current) use of antithrombotics/antiplatelets: Secondary | ICD-10-CM | POA: Diagnosis not present

## 2017-10-28 DIAGNOSIS — G4489 Other headache syndrome: Secondary | ICD-10-CM | POA: Diagnosis not present

## 2017-10-28 DIAGNOSIS — Z743 Need for continuous supervision: Secondary | ICD-10-CM | POA: Diagnosis not present

## 2017-10-28 DIAGNOSIS — E7800 Pure hypercholesterolemia, unspecified: Secondary | ICD-10-CM | POA: Diagnosis not present

## 2017-10-30 ENCOUNTER — Ambulatory Visit (INDEPENDENT_AMBULATORY_CARE_PROVIDER_SITE_OTHER): Payer: Medicare HMO | Admitting: Orthopaedic Surgery

## 2017-10-30 ENCOUNTER — Encounter (INDEPENDENT_AMBULATORY_CARE_PROVIDER_SITE_OTHER): Payer: Self-pay | Admitting: Orthopaedic Surgery

## 2017-10-30 DIAGNOSIS — S62101D Fracture of unspecified carpal bone, right wrist, subsequent encounter for fracture with routine healing: Secondary | ICD-10-CM

## 2017-10-30 NOTE — Progress Notes (Signed)
Office Visit Note   Patient: Vickie SnowballHelen M Hall           Date of Birth: [birth year]           MRN: [id number] Visit Date: 10/30/2017              Requested by: Ernestina PennaMoore, Donald W, MD [address] [place], [place] [id number] PCP: Ernestina PennaMoore, Donald W, MD   Assessment & Plan: Visit Diagnoses:  1. Closed fracture of right wrist with routine healing, subsequent encounter     Plan: 5 weeks status post nondisplaced right distal radius fracture. Doing well and volar wrist splint. Splint was removed. No obvious deformity. Skin intact. Good grip and release. May wear the splint for the weekend and then discontinue. Will see as needed in the future if there is persistent pain or loss of motion Follow-Up Instructions: Return if symptoms worsen or fail to improve.   Orders:  No orders of the defined types were placed in this encounter.  No orders of the defined types were placed in this encounter.     Procedures: No procedures performed   Clinical Data: No additional findings.   Subjective: No chief complaint on file. 5 weeks status post nondisplaced right distal radius fracture. Mrs. Vickie Hall is in a rehabilitation skilled nursing facility. She is a nonambulator. She is accompanied by one of the facility staff members. Not having any pain in the splint  HPI  Review of Systems   Objective: Vital Signs: There were no vitals taken for this visit.  Physical Exam  Ortho Exam splint removed from right wrist. No obvious deformity. Skin intact. Neurovascular exam appears to be intact. No obvious swelling about the fracture. Multiple areas of tenderness that do not appear to be associated with  the fracture. Some limitation of motion of the wrist but still has about 30 of volar and dorsiflexion  Specialty Comments:  No specialty comments available.  Imaging: No results found.   PMFS History: Patient Active Problem List   Diagnosis Date Noted  . Closed fracture of right wrist 10/04/2017    . Foley catheter in place 11/12/2016  . Closed pertrochanteric fracture of femur, left, initial encounter (HCC) 10/29/2016  . Closed left hip fracture (HCC) 10/28/2016  . Dementia due to Alzheimer's disease 10/28/2016  . Aortic atherosclerosis (HCC) 05/29/2016  . Osteoporosis, senile 12/16/2013  . GERD (gastroesophageal reflux disease) 11/11/2013  . HTN (hypertension) 11/11/2013  . Hyperlipidemia 11/11/2013   Past Medical History:  Diagnosis Date  . GERD (gastroesophageal reflux disease)   . Hyperlipidemia   . Hypertension   . Memory deficit   . Osteoporosis     Family History  Family history unknown: Yes    Past Surgical History:  Procedure Laterality Date  . ABDOMINAL HYSTERECTOMY    . EYE SURGERY    . INTRAMEDULLARY (IM) NAIL INTERTROCHANTERIC Left 10/29/2016   Procedure: INTRAMEDULLARY (IM) NAIL INTERTROCHANTRIC;  Surgeon: Samson FredericBrian Swinteck, MD;  Location: [facility] OR;  Service: Orthopedics;  Laterality: Left;  . ORIF WRIST FRACTURE Left 11/01/2016   Procedure: OPEN REDUCTION INTERNAL FIXATION (ORIF) WRIST FRACTURE;  Surgeon: Dominica SeverinGramig, William, MD;  Location: [facility] OR;  Service: Orthopedics;  Laterality: Left;   Social History   Occupational History  . Not on file  Tobacco Use  . Smoking status: Never Smoker  . Smokeless tobacco: Never Used  Substance and Sexual Activity  . Alcohol use: No  . Drug use: No  . Sexual activity: Not on file  Garald Balding, MD   Note - This record has been created using [employer].  Chart creation errors have been sought, but may not always  have been located. Such creation errors do not reflect on  the standard of medical care.

## 2018-02-04 ENCOUNTER — Encounter: Payer: Self-pay | Admitting: Family Medicine

## 2018-02-04 ENCOUNTER — Ambulatory Visit (INDEPENDENT_AMBULATORY_CARE_PROVIDER_SITE_OTHER): Payer: Medicare HMO | Admitting: Family Medicine

## 2018-02-04 VITALS — BP 135/70 | HR 44 | Temp 96.9°F | Ht 62.0 in | Wt 154.0 lb

## 2018-02-04 DIAGNOSIS — R413 Other amnesia: Secondary | ICD-10-CM

## 2018-02-04 DIAGNOSIS — E7800 Pure hypercholesterolemia, unspecified: Secondary | ICD-10-CM | POA: Diagnosis not present

## 2018-02-04 DIAGNOSIS — E559 Vitamin D deficiency, unspecified: Secondary | ICD-10-CM | POA: Diagnosis not present

## 2018-02-04 DIAGNOSIS — I10 Essential (primary) hypertension: Secondary | ICD-10-CM | POA: Diagnosis not present

## 2018-02-04 DIAGNOSIS — D508 Other iron deficiency anemias: Secondary | ICD-10-CM

## 2018-02-04 DIAGNOSIS — I4891 Unspecified atrial fibrillation: Secondary | ICD-10-CM

## 2018-02-04 NOTE — Patient Instructions (Addendum)
Medicare Annual Wellness Visit  [facility] and the medical providers at [facility] strive to bring you the best medical care.  In doing so we not only want to address your current medical conditions and concerns but also to detect new conditions early and prevent illness, disease and health-related problems.    Medicare offers a yearly Wellness Visit which allows our clinical staff to assess your need for preventative services including immunizations, lifestyle education, counseling to decrease risk of preventable diseases and screening for fall risk and other medical concerns.    This visit is provided free of charge (no copay) for all Medicare recipients. The clinical pharmacists at [facility] have begun to conduct these Wellness Visits which will also include a thorough review of all your medications.    As you primary medical provider recommend that you make an appointment for your Annual Wellness Visit if you have not done so already this year.  You may set up this appointment before you leave today or you may call back ([phone]) and schedule an appointment.  Please make sure when you call that you mention that you are scheduling your Annual Wellness Visit with the clinical pharmacist so that the appointment may be made for the proper length of time.    Continue current medications. Continue good therapeutic lifestyle changes which include good diet and exercise. Fall precautions discussed with patient. If an FOBT was given today- please return it to our front desk. If you are over 87 years old - you may need Prevnar 13 or the adult Pneumonia vaccine.  **Flu shots are available--- please call and schedule a FLU-CLINIC appointment**  After your visit with [place] today you will receive a survey in the mail or online from [employer] regarding your care with [place]. Please take a moment to fill this out. Your feedback is very  important to [place] as you can help [place] better understand your patient needs as well as improve your experience and satisfaction. WE CARE ABOUT YOU!!!   Hold the Zetia and give only caffeine free diet Also, stop the senna. Somewhat should get back in touch with [place] in a couple weeks regarding the progress and whether this is helped her loose bowel movements or not.

## 2018-02-04 NOTE — Progress Notes (Signed)
Subjective:    Patient ID: Vickie Hall, female    DOB: [birth year], 84 y.o.   MRN: [id number]  HPI Pt here for follow up and management of chronic medical problems which includes hypertension and hyperlipidemia. She is taking medication regularly.  The patient comes from [facility] assisted living.  They do indicate she is having some loose bowel movements.  All of her medicines were reviewed.  I do not see anything that is present that could be playing a role with loose bowel movements.  We will make sure that we get electrolytes today.  The patient's memory loss persist she is not agitated.  She refuses to walk and physical therapy was discontinued.  She is in a wheelchair today.  She denies any chest pain shortness of breath trouble swallowing heartburn indigestion or blood in the stool.  She has been having loose bowel movements with each bowel movement.  Medications were reviewed and apparently she is still taking senna so we will stop this and make sure that she is doing caffeine free drinks.  We will also hold the Zetia.  She is passing her water without problems.    Patient Active Problem List   Diagnosis Date Noted  . Closed fracture of right wrist 10/04/2017  . Foley catheter in place 11/12/2016  . Closed pertrochanteric fracture of femur, left, initial encounter ([place]) 10/29/2016  . Closed left hip fracture ([place]) 10/28/2016  . Dementia due to Alzheimer's disease 10/28/2016  . Aortic atherosclerosis ([place]) 05/29/2016  . Osteoporosis, senile 12/16/2013  . GERD (gastroesophageal reflux disease) 11/11/2013  . HTN (hypertension) 11/11/2013  . Hyperlipidemia 11/11/2013   Outpatient Encounter Medications as of 02/04/2018  Medication Sig  . apixaban (ELIQUIS) 5 MG TABS tablet Take 1 tablet (5 mg total) by mouth 2 (two) times daily.  Marland Kitchen ezetimibe (ZETIA) 10 MG tablet Take 10 mg by mouth daily.  . ferrous sulfate 325 (65 FE) MG tablet Take 1 tablet (325 mg total) by mouth daily with  breakfast.  . senna-docusate (SENOKOT-S) 8.6-50 MG tablet Take 2 tablets by mouth 2 (two) times daily.  . valsartan (DIOVAN) 160 MG tablet Take 160 mg by mouth daily.   Marland Kitchen acetaminophen (TYLENOL) 325 MG tablet Take 2 tablets (650 mg total) by mouth every 6 (six) hours as needed for mild pain (or Fever >/= 101). (Patient not taking: Reported on 02/04/2018)  . diphenhydrAMINE (BENADRYL) 25 MG tablet Take 25 mg by mouth every 4 (four) hours as needed for itching, allergies or sleep.  . [DISCONTINUED] HYDROcodone-acetaminophen (NORCO/VICODIN) 5-325 MG tablet Take 1 tablet by mouth every 6 (six) hours as needed for moderate pain.  . [DISCONTINUED] Memantine HCl-Donepezil HCl (NAMZARIC) 28-10 MG CP24 Take 1 tablet by mouth at bedtime.  . [DISCONTINUED] Vitamin D, Ergocalciferol, (DRISDOL) 50000 UNITS CAPS capsule Take 1 capsule (50,000 Units total) by mouth every 7 (seven) days.   No facility-administered encounter medications on file as of 02/04/2018.       Review of Systems  Constitutional: Negative.   HENT: Negative.   Eyes: Negative.   Respiratory: Negative.   Cardiovascular: Negative.   Gastrointestinal: Negative.        Loose stool   Endocrine: Negative.   Genitourinary: Negative.   Musculoskeletal: Negative.   Skin: Negative.   Allergic/Immunologic: Negative.   Neurological: Negative.   Hematological: Negative.   Psychiatric/Behavioral: Negative.        Objective:   Physical Exam  Constitutional: She appears well-developed and well-nourished. No distress.  Patient is stable and not agitated.  HENT:  Head: Normocephalic and atraumatic.  Right Ear: External ear normal.  Left Ear: External ear normal.  Nose: Nose normal.  Mouth/Throat: Oropharynx is clear and moist. No oropharyngeal exudate.  Eyes: Pupils are equal, round, and reactive to light. Conjunctivae and EOM are normal. Right eye exhibits no discharge. Left eye exhibits no discharge. No scleral icterus.  Neck: Normal  range of motion. Neck supple. No thyromegaly present.  No bruits thyromegaly or anterior cervical adenopathy  Cardiovascular: Normal rate, regular rhythm and normal heart sounds.  No murmur heard. Heart is regular at 72/min  Pulmonary/Chest: Effort normal and breath sounds normal. She has no wheezes. She has no rales.  Clear anteriorly and posteriorly  Abdominal: Soft. Bowel sounds are normal. She exhibits no mass. There is tenderness. There is no guarding.  No obvious liver or spleen enlargement some gas with generalized mild tenderness.  Musculoskeletal: Normal range of motion. She exhibits no edema or tenderness.  The patient comes in in a wheelchair and the caregiver says that she refuses to do physical therapy and stays on a wheelchair most the time.  She does have good upper extremity strength and lower extremity strength without edema.  Lymphadenopathy:    She has no cervical adenopathy.  Neurological: She has normal reflexes. No cranial nerve deficit.  The patient does have dementia and does not know my name but seems to be in a pleasant state of mind without any agitation.  Skin: Skin is warm and dry. No rash noted.  Psychiatric: She has a normal mood and affect. Her behavior is normal.  Memory and judgment are impaired.  The mood and affect appeared normal behavior is normal considering her condition.  Nursing note and vitals reviewed.  BP 135/70 (BP Location: Left Arm)   Pulse (!) 44   Temp (!) 96.9 F (36.1 C) (Oral)   Ht _0  (1.575 m)   Wt 154 lb (69.9 kg)   BMI 28.17 kg/m        Assessment & Plan:  1. Essential hypertension -Blood pressure is good she will continue with current treatment - BMP8+EGFR - CBC with Differential/Platelet - Hepatic function panel  2. Pure hypercholesterolemia -She will continue with aggressive therapeutic lifestyle changes - CBC with Differential/Platelet - Lipid panel  3. Other iron deficiency anemia -Continue with iron  replacement pending results of lab work - CBC with Differential/Platelet  4. Memory deficit -Continue with her medication for memory which is necessary. - CBC with Differential/Platelet  5. Vitamin D deficiency -Continue with vitamin D replacement - CBC with Differential/Platelet - VITAMIN D 25 Hydroxy (Vit-D Deficiency, Fractures)  6. Atrial fibrillation, unspecified type ([place]) -Heart had a regular rate and rhythm today but she will continue with her Eliquis.  Patient Instructions                       Medicare Annual Wellness Visit  [facility] and the medical providers at [facility] strive to bring you the best medical care.  In doing so we not only want to address your current medical conditions and concerns but also to detect new conditions early and prevent illness, disease and health-related problems.    Medicare offers a yearly Wellness Visit which allows our clinical staff to assess your need for preventative services including immunizations, lifestyle education, counseling to decrease risk of preventable diseases and screening for fall risk and other medical concerns.  This visit is provided free of charge (no copay) for all Medicare recipients. The clinical pharmacists at [facility] have begun to conduct these Wellness Visits which will also include a thorough review of all your medications.    As you primary medical provider recommend that you make an appointment for your Annual Wellness Visit if you have not done so already this year.  You may set up this appointment before you leave today or you may call back ([phone]) and schedule an appointment.  Please make sure when you call that you mention that you are scheduling your Annual Wellness Visit with the clinical pharmacist so that the appointment may be made for the proper length of time.    Continue current medications. Continue good therapeutic lifestyle changes which  include good diet and exercise. Fall precautions discussed with patient. If an FOBT was given today- please return it to our front desk. If you are over 61 years old - you may need Prevnar 68 or the adult Pneumonia vaccine.  **Flu shots are available--- please call and schedule a FLU-CLINIC appointment**  After your visit with [place] today you will receive a survey in the mail or online from [employer] regarding your care with [place]. Please take a moment to fill this out. Your feedback is very important to [place] as you can help [place] better understand your patient needs as well as improve your experience and satisfaction. WE CARE ABOUT YOU!!!   Hold the Zetia and give only caffeine free diet Also, stop the senna. Somewhat should get back in touch with [place] in a couple weeks regarding the progress and whether this is helped her loose bowel movements or not.    Arrie Senate MD

## 2018-02-05 ENCOUNTER — Other Ambulatory Visit: Payer: Self-pay | Admitting: *Deleted

## 2018-02-05 LAB — BMP8+EGFR
BUN/Creatinine Ratio: 17 (ref 12–28)
BUN: 15 mg/dL (ref 8–27)
CHLORIDE: 106 mmol/L (ref 96–106)
CO2: 24 mmol/L (ref 20–29)
CREATININE: 0.86 mg/dL (ref 0.57–1.00)
Calcium: 9.3 mg/dL (ref 8.7–10.3)
GFR calc Af Amer: 70 mL/min/{1.73_m2} (ref >59)
GFR calc non Af Amer: 61 mL/min/{1.73_m2} (ref >59)
Glucose: 98 mg/dL (ref 65–99)
POTASSIUM: 4.1 mmol/L (ref 3.5–5.2)
Sodium: 145 mmol/L — ABNORMAL HIGH (ref 134–144)

## 2018-02-05 LAB — CBC WITH DIFFERENTIAL/PLATELET
BASOS ABS: 0 10*3/uL (ref 0.0–0.2)
Basos: 0 %
EOS (ABSOLUTE): 0.2 10*3/uL (ref 0.0–0.4)
Eos: 3 %
Hematocrit: 41 % (ref 34.0–46.6)
Hemoglobin: 13.5 g/dL (ref 11.1–15.9)
IMMATURE GRANS (ABS): 0 10*3/uL (ref 0.0–0.1)
IMMATURE GRANULOCYTES: 0 %
LYMPHS: 30 %
Lymphocytes Absolute: 2.3 10*3/uL (ref 0.7–3.1)
MCH: 30.9 pg (ref 26.6–33.0)
MCHC: 32.9 g/dL (ref 31.5–35.7)
MCV: 94 fL (ref 79–97)
Monocytes Absolute: 0.8 10*3/uL (ref 0.1–0.9)
Monocytes: 10 %
NEUTROS PCT: 57 %
Neutrophils Absolute: 4.4 10*3/uL (ref 1.4–7.0)
PLATELETS: 273 10*3/uL (ref 150–450)
RBC: 4.37 x10E6/uL (ref 3.77–5.28)
RDW: 14.2 % (ref 12.3–15.4)
WBC: 7.7 10*3/uL (ref 3.4–10.8)

## 2018-02-05 LAB — HEPATIC FUNCTION PANEL
ALT: 10 IU/L (ref 0–32)
AST: 14 IU/L (ref 0–40)
Albumin: 4.3 g/dL (ref 3.5–4.7)
Alkaline Phosphatase: 69 IU/L (ref 39–117)
BILIRUBIN TOTAL: 0.3 mg/dL (ref 0.0–1.2)
BILIRUBIN, DIRECT: 0.1 mg/dL (ref 0.00–0.40)
Total Protein: 6.4 g/dL (ref 6.0–8.5)

## 2018-02-05 LAB — LIPID PANEL
CHOLESTEROL TOTAL: 204 mg/dL — AB (ref 100–199)
Chol/HDL Ratio: 4.4 ratio (ref 0.0–4.4)
HDL: 46 mg/dL (ref >39)
LDL Calculated: 119 mg/dL — ABNORMAL HIGH (ref 0–99)
Triglycerides: 197 mg/dL — ABNORMAL HIGH (ref 0–149)
VLDL CHOLESTEROL CAL: 39 mg/dL (ref 5–40)

## 2018-02-05 LAB — VITAMIN D 25 HYDROXY (VIT D DEFICIENCY, FRACTURES): VIT D 25 HYDROXY: 38.5 ng/mL (ref 30.0–100.0)

## 2018-02-05 MED ORDER — VITAMIN D (ERGOCALCIFEROL) 1.25 MG (50000 UNIT) PO CAPS: 50000.0000 [IU] | ORAL_CAPSULE | ORAL | 1 refills | Status: AC

## 2018-03-16 DIAGNOSIS — Z7901 Long term (current) use of anticoagulants: Secondary | ICD-10-CM | POA: Diagnosis not present

## 2018-03-16 DIAGNOSIS — Z743 Need for continuous supervision: Secondary | ICD-10-CM | POA: Diagnosis not present

## 2018-03-16 DIAGNOSIS — I10 Essential (primary) hypertension: Secondary | ICD-10-CM | POA: Diagnosis not present

## 2018-03-16 DIAGNOSIS — Z79899 Other long term (current) drug therapy: Secondary | ICD-10-CM | POA: Diagnosis not present

## 2018-03-16 DIAGNOSIS — R402411 Glasgow coma scale score 13-15, in the field [EMT or ambulance]: Secondary | ICD-10-CM | POA: Diagnosis not present

## 2018-03-16 DIAGNOSIS — R040 Epistaxis: Secondary | ICD-10-CM | POA: Diagnosis not present

## 2018-03-16 DIAGNOSIS — R279 Unspecified lack of coordination: Secondary | ICD-10-CM | POA: Diagnosis not present

## 2018-03-16 DIAGNOSIS — F0390 Unspecified dementia without behavioral disturbance: Secondary | ICD-10-CM | POA: Diagnosis not present

## 2018-05-07 ENCOUNTER — Ambulatory Visit (INDEPENDENT_AMBULATORY_CARE_PROVIDER_SITE_OTHER): Payer: Medicare HMO | Admitting: Family Medicine

## 2018-05-07 ENCOUNTER — Encounter: Payer: Self-pay | Admitting: Family Medicine

## 2018-05-07 VITALS — BP 147/92 | HR 67 | Temp 96.8°F | Ht 62.0 in | Wt 154.0 lb

## 2018-05-07 DIAGNOSIS — I10 Essential (primary) hypertension: Secondary | ICD-10-CM | POA: Diagnosis not present

## 2018-05-07 DIAGNOSIS — E559 Vitamin D deficiency, unspecified: Secondary | ICD-10-CM

## 2018-05-07 DIAGNOSIS — E7800 Pure hypercholesterolemia, unspecified: Secondary | ICD-10-CM | POA: Diagnosis not present

## 2018-05-07 DIAGNOSIS — R413 Other amnesia: Secondary | ICD-10-CM

## 2018-05-07 DIAGNOSIS — D508 Other iron deficiency anemias: Secondary | ICD-10-CM

## 2018-05-07 NOTE — Addendum Note (Signed)
Addended by: Ernestina PennaMOORE, Lynnet Hefley W on: 05/07/2018 12:18 PM   Modules accepted: Level of Service

## 2018-05-07 NOTE — Progress Notes (Signed)
Subjective:    Patient ID: Vickie Hall, female    DOB: April 14, 1928, 84 y.o.   MRN: [id number]  HPI Pt here for hospital follow up from [place] for a nosebleed.  The patient is on Eliquis.  She is taking Benadryl which could be drying in nature.  Unless there is a good reason we should probably discontinue the Benadryl.  They comes with her care giver from [facility].  The patient seems to be in no distress and is smiling and happy and knew my name because is the same as her maiden name.  She denies today any chest pain pressure tightness or shortness of breath.  She denies any trouble with her GI tract including nausea vomiting diarrhea or blood in the stool.  She is passing her water without problems.  The care facility has no specific complaints and the nosebleed that was noted actually occurred almost 2 months ago.  She is on Eliquis for atrial fib.   Patient Active Problem List   Diagnosis Date Noted  . Closed fracture of right wrist 10/04/2017  . Foley catheter in place 11/12/2016  . Closed pertrochanteric fracture of femur, left, initial encounter ([place]) 10/29/2016  . Closed left hip fracture ([place]) 10/28/2016  . Dementia due to Alzheimer's disease 10/28/2016  . Aortic atherosclerosis ([place]) 05/29/2016  . Osteoporosis, senile 12/16/2013  . GERD (gastroesophageal reflux disease) 11/11/2013  . HTN (hypertension) 11/11/2013  . Hyperlipidemia 11/11/2013   Outpatient Encounter Medications as of 05/07/2018  Medication Sig  . acetaminophen (TYLENOL) 325 MG tablet Take 2 tablets (650 mg total) by mouth every 6 (six) hours as needed for mild pain (or Fever >/= 101).  Marland Kitchen apixaban (ELIQUIS) 5 MG TABS tablet Take 1 tablet (5 mg total) by mouth 2 (two) times daily.  . diphenhydrAMINE (BENADRYL) 25 MG tablet Take 25 mg by mouth every 4 (four) hours as needed for itching, allergies or sleep.  Marland Kitchen ezetimibe (ZETIA) 10 MG tablet Take 10 mg by mouth daily.  . ferrous sulfate 325  (65 FE) MG tablet Take 1 tablet (325 mg total) by mouth daily with breakfast.  . senna-docusate (SENOKOT-S) 8.6-50 MG tablet Take 2 tablets by mouth 2 (two) times daily.  . valsartan (DIOVAN) 160 MG tablet Take 160 mg by mouth daily.   . Vitamin D, Ergocalciferol, (DRISDOL) 50000 units CAPS capsule Take 1 capsule (50,000 Units total) by mouth every 7 (seven) days.   No facility-administered encounter medications on file as of 05/07/2018.       Review of Systems  Constitutional: Negative.   HENT: Positive for nosebleeds.   Eyes: Negative.   Respiratory: Negative.   Cardiovascular: Negative.   Gastrointestinal: Negative.   Endocrine: Negative.   Genitourinary: Negative.   Musculoskeletal: Negative.   Skin: Negative.   Allergic/Immunologic: Negative.   Neurological: Negative.   Hematological: Negative.   Psychiatric/Behavioral: Negative.        Objective:   Physical Exam  Constitutional: She appears well-developed and well-nourished. No distress.  The patient appears to be stable as far as her memory is concerned.  She is smiling and she knew my last name.  She also laughs when I ask if she had a boyfriend.  HENT:  Head: Normocephalic and atraumatic.  Right Ear: External ear normal.  Left Ear: External ear normal.  Nose: Nose normal.  Mouth/Throat: Oropharynx is clear and moist. No oropharyngeal exudate.  Oral cavity looks good and patient is well-hydrated.  Some ear cerumen  each ear canal.  Eyes: Pupils are equal, round, and reactive to light. Conjunctivae and EOM are normal. Right eye exhibits no discharge. Left eye exhibits no discharge. No scleral icterus.  Neck: Normal range of motion. Neck supple. No thyromegaly present.  No bruits or thyromegaly or adenopathy noted.  Cardiovascular: Normal rate and normal heart sounds.  No murmur heard. Heart is irregular irregular at 72/min with no edema.  Pulmonary/Chest: Effort normal and breath sounds normal. No respiratory  distress. She has no wheezes. She has no rales.  Abdominal: Soft. Bowel sounds are normal. She exhibits no mass. There is no tenderness. There is no rebound and no guarding.  Patient is wearing a depends pull-up.  Is no abdominal tenderness organ enlargement or bruits noted.  No masses.  Musculoskeletal: Normal range of motion. She exhibits no edema.  Patient was in wheelchair when she was examined.  Lymphadenopathy:    She has no cervical adenopathy.  Neurological: She is alert. She has normal reflexes. No cranial nerve deficit.  Patient is and seems to be as alert as usual with memory impairment.  Skin: Skin is warm and dry. No rash noted.  Psychiatric: She has a normal mood and affect. Her behavior is normal. Judgment and thought content normal.  Mood affect and behavior are normal for this patient and have not changed since the last visit.  Nursing note and vitals reviewed.  BP (!) 147/92 (BP Location: Left Arm)   Pulse 67   Temp (!) 96.8 F (36 C) (Oral)   Ht 5' 2" (1.575 m)   Wt 154 lb (69.9 kg)   BMI 28.17 kg/m         Assessment & Plan:  1. Essential hypertension -The blood pressure is good for this patient and there will be no changes made even though it is slightly elevated. - BMP8+EGFR - CBC with Differential/Platelet - Hepatic function panel  2. Pure hypercholesterolemia -Continue with Zetia and therapeutic lifestyle changes - CBC with Differential/Platelet - Lipid panel  3. Other iron deficiency anemia - CBC with Differential/Platelet  4. Vitamin D deficiency - CBC with Differential/Platelet - VITAMIN D 25 Hydroxy (Vit-D Deficiency, Fractures)  5. Memory deficit -Seems to be stable we will continue with the current treatment regimen I will discontinue the Benadryl that she has been taking and change to Claritin which is a nonsedating antihistamine. - CBC with Differential/Platelet   No orders of the defined types were placed in this  encounter.  Patient Instructions  We will call when lab work becomes available and make sure that a [facility] We will ask you to discontinue the Benadryl as this is more drying and change to Claritin 1 daily as needed for allergy We will also asked the patient to use nasal saline gel at nighttime regularly and nasal saline spray in each nostril during the day as needed for drying and nosebleed. Make sure she continues to drink plenty of fluids and stays well-hydrated.  Don W. Moore MD  

## 2018-05-07 NOTE — Patient Instructions (Signed)
We will call when lab work becomes available and make sure that a copy is sent to [facility] We will ask you to discontinue the Benadryl as this is more drying and change to Claritin 1 daily as needed for allergy We will also asked the patient to use nasal saline gel at nighttime regularly and nasal saline spray in each nostril during the day as needed for drying and nosebleed. Make sure she continues to drink plenty of fluids and stays well-hydrated.

## 2018-05-08 LAB — LIPID PANEL
Chol/HDL Ratio: 3.7 ratio (ref 0.0–4.4)
Cholesterol, Total: 168 mg/dL (ref 100–199)
HDL: 46 mg/dL (ref >39)
LDL Calculated: 98 mg/dL (ref 0–99)
Triglycerides: 121 mg/dL (ref 0–149)
VLDL CHOLESTEROL CAL: 24 mg/dL (ref 5–40)

## 2018-05-08 LAB — BMP8+EGFR
BUN/Creatinine Ratio: 19 (ref 12–28)
BUN: 16 mg/dL (ref 8–27)
CO2: 21 mmol/L (ref 20–29)
Calcium: 9.2 mg/dL (ref 8.7–10.3)
Chloride: 105 mmol/L (ref 96–106)
Creatinine, Ser: 0.85 mg/dL (ref 0.57–1.00)
GFR, EST AFRICAN AMERICAN: 71 mL/min/{1.73_m2} (ref >59)
GFR, EST NON AFRICAN AMERICAN: 61 mL/min/{1.73_m2} (ref >59)
Glucose: 86 mg/dL (ref 65–99)
POTASSIUM: 4.7 mmol/L (ref 3.5–5.2)
SODIUM: 143 mmol/L (ref 134–144)

## 2018-05-08 LAB — CBC WITH DIFFERENTIAL/PLATELET
BASOS: 1 %
Basophils Absolute: 0 10*3/uL (ref 0.0–0.2)
EOS (ABSOLUTE): 0.3 10*3/uL (ref 0.0–0.4)
Eos: 4 %
Hematocrit: 43.4 % (ref 34.0–46.6)
Hemoglobin: 13.5 g/dL (ref 11.1–15.9)
Immature Grans (Abs): 0 10*3/uL (ref 0.0–0.1)
Immature Granulocytes: 0 %
LYMPHS ABS: 2.3 10*3/uL (ref 0.7–3.1)
Lymphs: 29 %
MCH: 30.5 pg (ref 26.6–33.0)
MCHC: 31.1 g/dL — ABNORMAL LOW (ref 31.5–35.7)
MCV: 98 fL — AB (ref 79–97)
MONOS ABS: 0.8 10*3/uL (ref 0.1–0.9)
Monocytes: 10 %
NEUTROS ABS: 4.5 10*3/uL (ref 1.4–7.0)
Neutrophils: 56 %
Platelets: 272 10*3/uL (ref 150–450)
RBC: 4.43 x10E6/uL (ref 3.77–5.28)
RDW: 14.2 % (ref 12.3–15.4)
WBC: 7.8 10*3/uL (ref 3.4–10.8)

## 2018-05-08 LAB — HEPATIC FUNCTION PANEL
ALT: 14 IU/L (ref 0–32)
AST: 13 IU/L (ref 0–40)
Albumin: 4.4 g/dL (ref 3.5–4.7)
Alkaline Phosphatase: 72 IU/L (ref 39–117)
BILIRUBIN TOTAL: 0.4 mg/dL (ref 0.0–1.2)
BILIRUBIN, DIRECT: 0.12 mg/dL (ref 0.00–0.40)
TOTAL PROTEIN: 6.8 g/dL (ref 6.0–8.5)

## 2018-05-08 LAB — VITAMIN D 25 HYDROXY (VIT D DEFICIENCY, FRACTURES): VIT D 25 HYDROXY: 37.7 ng/mL (ref 30.0–100.0)

## 2018-05-13 ENCOUNTER — Encounter: Payer: Self-pay | Admitting: *Deleted

## 2018-05-13 ENCOUNTER — Telehealth: Payer: Self-pay | Admitting: Family Medicine

## 2018-05-13 NOTE — Telephone Encounter (Signed)
Held for NP for over five min - NA  [id number]-jhb

## 2018-06-05 ENCOUNTER — Telehealth: Payer: Self-pay | Admitting: Family Medicine

## 2018-06-05 NOTE — Telephone Encounter (Signed)
Aware NTBS - appt made

## 2018-06-05 NOTE — Telephone Encounter (Signed)
[facility] [place] about pt left eye, please ask for Lynford HumphreyShanda she will give more information

## 2018-06-06 ENCOUNTER — Ambulatory Visit (INDEPENDENT_AMBULATORY_CARE_PROVIDER_SITE_OTHER): Payer: Medicare HMO | Admitting: Nurse Practitioner

## 2018-06-06 ENCOUNTER — Encounter: Payer: Self-pay | Admitting: Nurse Practitioner

## 2018-06-06 VITALS — BP 143/94 | HR 84 | Temp 97.1°F | Ht 62.0 in | Wt 154.0 lb

## 2018-06-06 DIAGNOSIS — H1132 Conjunctival hemorrhage, left eye: Secondary | ICD-10-CM

## 2018-06-06 NOTE — Progress Notes (Signed)
   Subjective:    Patient ID: Vickie Hall, female    DOB: 04/21/1928, 84 y.o.   MRN: [id number]   Chief ComLamont Snowballplaint: Left eye red (2 days)   [facility] Started about 2 days ago. No drainage, no itching. Denies any injury.    Review of Systems  Constitutional: Negative for activity change and appetite change.  HENT: Negative.   Eyes: Positive for redness (left). Negative for photophobia, pain, discharge, itching and visual disturbance.  Respiratory: Negative for shortness of breath.   Cardiovascular: Negative for chest pain, palpitations and leg swelling.  Gastrointestinal: Negative for abdominal pain.  Endocrine: Negative for polydipsia.  Genitourinary: Negative.   Skin: Negative for rash.  Neurological: Negative for dizziness, weakness and headaches.  Hematological: Does not bruise/bleed easily.  Psychiatric/Behavioral: Negative.   All other systems reviewed and are negative.      Objective:   Physical Exam  Constitutional: She is oriented to person, place, and time. She appears well-developed and well-nourished. No distress.  Eyes: Pupils are equal, round, and reactive to light. EOM are normal. Right eye exhibits no discharge. Left eye exhibits no discharge.  Left scleral erythema  Neck: Normal range of motion. Neck supple.  Cardiovascular: Normal rate and regular rhythm.  Pulmonary/Chest: Effort normal.  Neurological: She is alert and oriented to person, place, and time.  Skin: Skin is warm and dry.  Psychiatric: She has a normal mood and affect. Her behavior is normal. Thought content normal.   BP (!) 143/94   Pulse 84   Temp (!) 97.1 F (36.2 C) (Oral)   Ht 5\' 2"  (1.575 m)   Wt 154 lb (69.9 kg)   BMI 28.17 kg/m         Assessment & Plan:  Lamont SnowballHelen M Studstill in today with chief complaint of Left eye red (2 days)   1. Scleral hemorrhage of left eye Cool compresses Avoid rubbing eye If worsens let me  know  Mary-Margaret Daphine DeutscherMartin, FNP

## 2018-06-06 NOTE — Patient Instructions (Signed)

## 2018-06-11 ENCOUNTER — Ambulatory Visit: Payer: Medicare HMO | Admitting: Family Medicine

## 2018-06-23 ENCOUNTER — Emergency Department (HOSPITAL_COMMUNITY)
Admission: EM | Admit: 2018-06-23 | Discharge: 2018-06-23 | Disposition: A | Discharge status: Home / Self Care | Payer: Medicare HMO | Attending: Emergency Medicine | Admitting: Emergency Medicine

## 2018-06-23 ENCOUNTER — Encounter (HOSPITAL_COMMUNITY): Payer: Self-pay

## 2018-06-23 ENCOUNTER — Emergency Department (HOSPITAL_COMMUNITY): Payer: Medicare HMO

## 2018-06-23 ENCOUNTER — Other Ambulatory Visit: Payer: Self-pay

## 2018-06-23 DIAGNOSIS — M79605 Pain in left leg: Secondary | ICD-10-CM | POA: Diagnosis not present

## 2018-06-23 DIAGNOSIS — S79912A Unspecified injury of left hip, initial encounter: Secondary | ICD-10-CM | POA: Diagnosis not present

## 2018-06-23 DIAGNOSIS — R52 Pain, unspecified: Secondary | ICD-10-CM | POA: Diagnosis not present

## 2018-06-23 DIAGNOSIS — Z79899 Other long term (current) drug therapy: Secondary | ICD-10-CM | POA: Diagnosis not present

## 2018-06-23 DIAGNOSIS — I10 Essential (primary) hypertension: Secondary | ICD-10-CM | POA: Insufficient documentation

## 2018-06-23 DIAGNOSIS — M25562 Pain in left knee: Secondary | ICD-10-CM | POA: Diagnosis not present

## 2018-06-23 DIAGNOSIS — I4891 Unspecified atrial fibrillation: Secondary | ICD-10-CM | POA: Diagnosis not present

## 2018-06-23 DIAGNOSIS — F0280 Dementia in other diseases classified elsewhere without behavioral disturbance: Secondary | ICD-10-CM | POA: Insufficient documentation

## 2018-06-23 DIAGNOSIS — G309 Alzheimer's disease, unspecified: Secondary | ICD-10-CM | POA: Diagnosis not present

## 2018-06-23 DIAGNOSIS — R5381 Other malaise: Secondary | ICD-10-CM | POA: Diagnosis not present

## 2018-06-23 DIAGNOSIS — R0902 Hypoxemia: Secondary | ICD-10-CM | POA: Diagnosis not present

## 2018-06-23 DIAGNOSIS — Z7901 Long term (current) use of anticoagulants: Secondary | ICD-10-CM | POA: Insufficient documentation

## 2018-06-23 DIAGNOSIS — S8992XA Unspecified injury of left lower leg, initial encounter: Secondary | ICD-10-CM | POA: Diagnosis not present

## 2018-06-23 DIAGNOSIS — Z7401 Bed confinement status: Secondary | ICD-10-CM | POA: Diagnosis not present

## 2018-06-23 LAB — CBC WITH DIFFERENTIAL/PLATELET
BASOS PCT: 0 %
Basophils Absolute: 0 10*3/uL (ref 0.0–0.1)
EOS ABS: 0 10*3/uL (ref 0.0–0.7)
Eosinophils Relative: 0 %
HCT: 46.7 % — ABNORMAL HIGH (ref 36.0–46.0)
HEMOGLOBIN: 15.2 g/dL — AB (ref 12.0–15.0)
Lymphocytes Relative: 10 %
Lymphs Abs: 1 10*3/uL (ref 0.7–4.0)
MCH: 31.2 pg (ref 26.0–34.0)
MCHC: 32.5 g/dL (ref 30.0–36.0)
MCV: 95.9 fL (ref 78.0–100.0)
MONO ABS: 0.7 10*3/uL (ref 0.1–1.0)
Monocytes Relative: 7 %
NEUTROS PCT: 83 %
Neutro Abs: 8.5 10*3/uL — ABNORMAL HIGH (ref 1.7–7.7)
Platelets: 262 10*3/uL (ref 150–400)
RBC: 4.87 MIL/uL (ref 3.87–5.11)
RDW: 13.7 % (ref 11.5–15.5)
WBC: 10.3 10*3/uL (ref 4.0–10.5)

## 2018-06-23 LAB — BASIC METABOLIC PANEL
Anion gap: 9 (ref 5–15)
BUN: 15 mg/dL (ref 8–23)
CALCIUM: 9 mg/dL (ref 8.9–10.3)
CO2: 26 mmol/L (ref 22–32)
CREATININE: 0.78 mg/dL (ref 0.44–1.00)
Chloride: 104 mmol/L (ref 98–111)
Glucose, Bld: 138 mg/dL — ABNORMAL HIGH (ref 70–99)
Potassium: 4 mmol/L (ref 3.5–5.1)
SODIUM: 139 mmol/L (ref 135–145)

## 2018-06-23 LAB — TYPE AND SCREEN
ABO/RH(D): O POS
Antibody Screen: NEGATIVE

## 2018-06-23 LAB — PROTIME-INR
INR: 1.23
PROTHROMBIN TIME: 15.4 s — AB (ref 11.4–15.2)

## 2018-06-23 MED ORDER — FENTANYL CITRATE (PF) 100 MCG/2ML IJ SOLN
50.0000 ug | INTRAMUSCULAR | Status: DC | PRN
  Filled 2018-06-23: qty 2

## 2018-06-23 NOTE — Discharge Instructions (Signed)
X-rays of the hip the knee and the lower leg show no signs of broken bones.  You may take ibuprofen or Tylenol if you are not allergic to these medications to help with pain as needed.  Please follow-up with your doctor if you are still having pain within the next week but return to the emergency department for increasing pain redness swelling or fever.

## 2018-06-23 NOTE — ED Notes (Signed)
Returned from x-ray via stretcher.

## 2018-06-23 NOTE — ED Provider Notes (Signed)
[facility] EMERGENCY DEPARTMENT Provider Note   CSN: [id number] Arrival date & time: 06/23/18  4098     History   Chief Complaint Chief Complaint  Patient presents with  . Leg Pain  . Foot Pain    HPI CELITA ARON is a 84 y.o. female.  HPI  84 year old female, she has a history of memory deficit, it is difficult to elicit history because of this cognitive deficit, level 5 caveat applies  The patient was found on the floor of her room in the nursing facility, she was complaining of left leg pain.  She was transported for this reason.  No other information is available at this time.  Past Medical History:  Diagnosis Date  . GERD (gastroesophageal reflux disease)   . Hyperlipidemia   . Hypertension   . Memory deficit   . Osteoporosis     Patient Active Problem List   Diagnosis Date Noted  . Closed fracture of right wrist 10/04/2017  . Foley catheter in place 11/12/2016  . Closed pertrochanteric fracture of femur, left, initial encounter ([facility]) 10/29/2016  . Closed left hip fracture ([facility]) 10/28/2016  . Dementia due to Alzheimer's disease ([facility]) 10/28/2016  . Aortic atherosclerosis ([facility]) 05/29/2016  . Osteoporosis, senile 12/16/2013  . GERD (gastroesophageal reflux disease) 11/11/2013  . HTN (hypertension) 11/11/2013  . Hyperlipidemia 11/11/2013    Past Surgical History:  Procedure Laterality Date  . ABDOMINAL HYSTERECTOMY    . EYE SURGERY    . INTRAMEDULLARY (IM) NAIL INTERTROCHANTERIC Left 10/29/2016   Procedure: INTRAMEDULLARY (IM) NAIL INTERTROCHANTRIC;  Surgeon: Samson Frederic, MD;  Location: [facility] OR;  Service: Orthopedics;  Laterality: Left;  . ORIF WRIST FRACTURE Left 11/01/2016   Procedure: OPEN REDUCTION INTERNAL FIXATION (ORIF) WRIST FRACTURE;  Surgeon: Dominica Severin, MD;  Location: [facility] OR;  Service: Orthopedics;  Laterality: Left;     OB History   None      Home Medications    Prior to Admission medications   Medication Sig Start Date End Date  Taking? Authorizing Provider  acetaminophen (TYLENOL) 325 MG tablet Take 2 tablets (650 mg total) by mouth every 6 (six) hours as needed for mild pain (or Fever >/= 101). 11/05/16   Elgergawy, Leana Roe, MD  apixaban (ELIQUIS) 5 MG TABS tablet Take 1 tablet (5 mg total) by mouth 2 (two) times daily. 05/28/17   Ernestina Penna, MD  diphenhydrAMINE (BENADRYL) 25 MG tablet Take 25 mg by mouth every 4 (four) hours as needed for itching, allergies or sleep.    [provider]  ezetimibe (ZETIA) 10 MG tablet Take 10 mg by mouth daily.    [provider]  ferrous sulfate 325 (65 FE) MG tablet Take 1 tablet (325 mg total) by mouth daily with breakfast. 11/05/16   Elgergawy, Leana Roe, MD  senna-docusate (SENOKOT-S) 8.6-50 MG tablet Take 2 tablets by mouth 2 (two) times daily. 11/05/16   Elgergawy, Leana Roe, MD  valsartan (DIOVAN) 160 MG tablet Take 160 mg by mouth daily.  08/29/16   [provider]  Vitamin D, Ergocalciferol, (DRISDOL) 50000 units CAPS capsule Take 1 capsule (50,000 Units total) by mouth every 7 (seven) days. 02/05/18   Ernestina Penna, MD    Family History Family History  Family history unknown: Yes    Social History Social History   Tobacco Use  . Smoking status: Never Smoker  . Smokeless tobacco: Never Used  Substance Use Topics  . Alcohol use: No  . Drug use: No  Allergies   Actonel [risedronate sodium] and Lipitor [atorvastatin]   Review of Systems Review of Systems  Unable to perform ROS: Dementia     Physical Exam Updated Vital Signs BP (!) 152/100 (BP Location: Right Arm)   Pulse 93   Temp 97.9 F (36.6 C) (Oral)   Resp 16   Wt 69.9 kg   SpO2 99%   BMI 28.19 kg/m   Physical Exam  Constitutional: She appears well-developed and well-nourished. No distress.  HENT:  Head: Normocephalic and atraumatic.  Mouth/Throat: Oropharynx is clear and moist. No oropharyngeal exudate.  No tenderness over the scalp, no hematomas depressions  or deformities.  No malocclusion.  Eyes: Pupils are equal, round, and reactive to light. Conjunctivae and EOM are normal. Right eye exhibits no discharge. Left eye exhibits no discharge. No scleral icterus.  Neck: Normal range of motion. Neck supple. No JVD present. No thyromegaly present.  Cardiovascular: Normal rate, regular rhythm, normal heart sounds and intact distal pulses. Exam reveals no gallop and no friction rub.  No murmur heard. Pulmonary/Chest: Effort normal and breath sounds normal. No respiratory distress. She has no wheezes. She has no rales.  No increased work of breathing, no tenderness over the chest wall  Abdominal: Soft. Bowel sounds are normal. She exhibits no distension and no mass. There is no tenderness.  Musculoskeletal: She exhibits tenderness and deformity. She exhibits no edema.  Decreased range of motion of the left hip secondary to pain, the left leg is shortened and slightly externally rotated.  She has normal-appearing knee ankle and foot bilaterally.  She moves her bilateral upper extremities without any difficulty.  Lymphadenopathy:    She has no cervical adenopathy.  Neurological: She is alert. Coordination normal.  Skin: Skin is warm and dry. No rash noted. No erythema.  Psychiatric: She has a normal mood and affect. Her behavior is normal.  Nursing note and vitals reviewed.    ED Treatments / Results  Labs (all labs ordered are listed, but only abnormal results are displayed) Labs Reviewed  BASIC METABOLIC PANEL - Abnormal; Notable for the following components:      Result Value   Glucose, Bld 138 (*)    All other components within normal limits  CBC WITH DIFFERENTIAL/PLATELET - Abnormal; Notable for the following components:   Hemoglobin 15.2 (*)    HCT 46.7 (*)    Neutro Abs 8.5 (*)    All other components within normal limits  PROTIME-INR - Abnormal; Notable for the following components:   Prothrombin Time 15.4 (*)    All other components  within normal limits  TYPE AND SCREEN    EKG None  Radiology Dg Tibia/fibula Left  Result Date: 06/23/2018 CLINICAL DATA:  84 year old female found on floor. Left leg pain. Prior left hip surgery. Initial encounter. EXAM: LEFT TIBIA AND FIBULA - 2 VIEW COMPARISON:  Left knee and femur films same date dictated separately. FINDINGS: There is no evidence of fracture or other focal bone lesions. Soft tissues are unremarkable. Left femoral intramedullary rod in place. Vascular calcifications. IMPRESSION: No fracture or dislocation. Electronically Signed   By: Lacy Duverney M.D.   On: 06/23/2018 09:18   Dg Knee Complete 4 Views Left  Result Date: 06/23/2018 CLINICAL DATA:  Fall.  LEFT knee pain EXAM: LEFT KNEE - COMPLETE 4+ VIEW COMPARISON:  None. FINDINGS: No fracture of the proximal tibia or distal femur. Patella is normal. No joint effusion. Nail fixation of the femur. Degenerative narrowing of the medial compartment.  IMPRESSION: 1. No acute fracture dislocation.  No effusion. 2. Osteoarthritis of the medial compartment Electronically Signed   By: Genevive Bi M.D.   On: 06/23/2018 09:13   Dg Hip Unilat With Pelvis 2-3 Views Left  Result Date: 06/23/2018 CLINICAL DATA:  Status post fall.  Prior left hip surgery. EXAM: DG HIP (WITH OR WITHOUT PELVIS) 2-3V LEFT COMPARISON:  None. FINDINGS: Generalized osteopenia. Left intertrochanteric fracture transfixed with a intramedullary nail and interlocking femoral neck screw without hardware failure or complication. No acute fracture or dislocation. Bilateral hip joint spaces are relatively well maintained. Mild osteoarthritis of bilateral sacroiliac joints. Lower lumbar spine spondylosis. IMPRESSION: 1. Generalized osteopenia. No acute osseous injury of the the left hip. Electronically Signed   By: Elige Ko   On: 06/23/2018 09:12    Procedures Procedures (including critical care time)  Medications Ordered in ED Medications  fentaNYL (SUBLIMAZE)  injection 50 mcg (has no administration in time range)     Initial Impression / Assessment and Plan / ED Course  I have reviewed the triage vital signs and the nursing notes.  Pertinent labs & imaging results that were available during my care of the patient were reviewed by me and considered in my medical decision making (see chart for details).     There appears to be isolated trauma to the left hip, will obtain x-rays, the patient may need to have surgery or be admitted if there are fractures.  She does not appear to have any other injuries at this time.  Family member is now at the bedside, he reports that she is her normal self, the patient's x-rays are negative for fracture, I have personally viewed the hip x-ray on the left, there does appear to be a prior fixation device, she is doing well and on repeat exam she is able to move her hip knee and lower extremity at the ankle.  There is no deformities, this is consistent with x-ray showing no signs of fracture.  Stable for discharge at this time.  Final Clinical Impressions(s) / ED Diagnoses   Final diagnoses:  Pain of left lower extremity    ED Discharge Orders    None       Eber Hong, MD 06/23/18 [phone]

## 2018-06-23 NOTE — ED Notes (Signed)
Called EMS for transport back to [facility]. 

## 2018-06-23 NOTE — ED Triage Notes (Signed)
Pt brought in by EMS. Pt found in the floor, in her room at [facility]. Pt is alert and verbally responsive. Oriented to person and place. Pt complaining of right lower leg pain and foot pain. Pt unable to give explanation of fall

## 2018-06-27 DIAGNOSIS — Z7901 Long term (current) use of anticoagulants: Secondary | ICD-10-CM | POA: Diagnosis not present

## 2018-06-27 DIAGNOSIS — E7800 Pure hypercholesterolemia, unspecified: Secondary | ICD-10-CM | POA: Diagnosis not present

## 2018-06-27 DIAGNOSIS — Z79899 Other long term (current) drug therapy: Secondary | ICD-10-CM | POA: Diagnosis not present

## 2018-06-27 DIAGNOSIS — R040 Epistaxis: Secondary | ICD-10-CM | POA: Diagnosis not present

## 2018-06-27 DIAGNOSIS — F0390 Unspecified dementia without behavioral disturbance: Secondary | ICD-10-CM | POA: Diagnosis not present

## 2018-06-27 DIAGNOSIS — I10 Essential (primary) hypertension: Secondary | ICD-10-CM | POA: Diagnosis not present

## 2018-07-17 DIAGNOSIS — M810 Age-related osteoporosis without current pathological fracture: Secondary | ICD-10-CM | POA: Diagnosis not present

## 2018-07-17 DIAGNOSIS — E559 Vitamin D deficiency, unspecified: Secondary | ICD-10-CM | POA: Diagnosis not present

## 2018-07-17 DIAGNOSIS — R2681 Unsteadiness on feet: Secondary | ICD-10-CM | POA: Diagnosis not present

## 2018-07-17 DIAGNOSIS — Z9181 History of falling: Secondary | ICD-10-CM | POA: Diagnosis not present

## 2018-07-17 DIAGNOSIS — F0280 Dementia in other diseases classified elsewhere without behavioral disturbance: Secondary | ICD-10-CM | POA: Diagnosis not present

## 2018-07-17 DIAGNOSIS — Z8781 Personal history of (healed) traumatic fracture: Secondary | ICD-10-CM | POA: Diagnosis not present

## 2018-07-17 DIAGNOSIS — I10 Essential (primary) hypertension: Secondary | ICD-10-CM | POA: Diagnosis not present

## 2018-07-17 DIAGNOSIS — G309 Alzheimer's disease, unspecified: Secondary | ICD-10-CM | POA: Diagnosis not present

## 2018-07-22 ENCOUNTER — Ambulatory Visit (INDEPENDENT_AMBULATORY_CARE_PROVIDER_SITE_OTHER): Payer: Medicare HMO

## 2018-07-22 DIAGNOSIS — Z9181 History of falling: Secondary | ICD-10-CM | POA: Diagnosis not present

## 2018-07-22 DIAGNOSIS — M810 Age-related osteoporosis without current pathological fracture: Secondary | ICD-10-CM | POA: Diagnosis not present

## 2018-07-22 DIAGNOSIS — E559 Vitamin D deficiency, unspecified: Secondary | ICD-10-CM

## 2018-07-22 DIAGNOSIS — Z8781 Personal history of (healed) traumatic fracture: Secondary | ICD-10-CM | POA: Diagnosis not present

## 2018-07-22 DIAGNOSIS — R2681 Unsteadiness on feet: Secondary | ICD-10-CM

## 2018-07-22 DIAGNOSIS — G309 Alzheimer's disease, unspecified: Secondary | ICD-10-CM

## 2018-07-22 DIAGNOSIS — I10 Essential (primary) hypertension: Secondary | ICD-10-CM

## 2018-07-22 DIAGNOSIS — F0280 Dementia in other diseases classified elsewhere without behavioral disturbance: Secondary | ICD-10-CM | POA: Diagnosis not present

## 2018-07-23 DIAGNOSIS — Z8781 Personal history of (healed) traumatic fracture: Secondary | ICD-10-CM | POA: Diagnosis not present

## 2018-07-23 DIAGNOSIS — R2681 Unsteadiness on feet: Secondary | ICD-10-CM | POA: Diagnosis not present

## 2018-07-23 DIAGNOSIS — I10 Essential (primary) hypertension: Secondary | ICD-10-CM | POA: Diagnosis not present

## 2018-07-23 DIAGNOSIS — M810 Age-related osteoporosis without current pathological fracture: Secondary | ICD-10-CM | POA: Diagnosis not present

## 2018-07-23 DIAGNOSIS — E559 Vitamin D deficiency, unspecified: Secondary | ICD-10-CM | POA: Diagnosis not present

## 2018-07-23 DIAGNOSIS — Z9181 History of falling: Secondary | ICD-10-CM | POA: Diagnosis not present

## 2018-07-23 DIAGNOSIS — G309 Alzheimer's disease, unspecified: Secondary | ICD-10-CM | POA: Diagnosis not present

## 2018-07-23 DIAGNOSIS — F0280 Dementia in other diseases classified elsewhere without behavioral disturbance: Secondary | ICD-10-CM | POA: Diagnosis not present

## 2018-07-25 DIAGNOSIS — I10 Essential (primary) hypertension: Secondary | ICD-10-CM | POA: Diagnosis not present

## 2018-07-25 DIAGNOSIS — Z8781 Personal history of (healed) traumatic fracture: Secondary | ICD-10-CM | POA: Diagnosis not present

## 2018-07-25 DIAGNOSIS — M810 Age-related osteoporosis without current pathological fracture: Secondary | ICD-10-CM | POA: Diagnosis not present

## 2018-07-25 DIAGNOSIS — G309 Alzheimer's disease, unspecified: Secondary | ICD-10-CM | POA: Diagnosis not present

## 2018-07-25 DIAGNOSIS — Z9181 History of falling: Secondary | ICD-10-CM | POA: Diagnosis not present

## 2018-07-25 DIAGNOSIS — F0280 Dementia in other diseases classified elsewhere without behavioral disturbance: Secondary | ICD-10-CM | POA: Diagnosis not present

## 2018-07-25 DIAGNOSIS — E559 Vitamin D deficiency, unspecified: Secondary | ICD-10-CM | POA: Diagnosis not present

## 2018-07-25 DIAGNOSIS — R2681 Unsteadiness on feet: Secondary | ICD-10-CM | POA: Diagnosis not present

## 2018-07-29 DIAGNOSIS — Z8781 Personal history of (healed) traumatic fracture: Secondary | ICD-10-CM | POA: Diagnosis not present

## 2018-07-29 DIAGNOSIS — F0280 Dementia in other diseases classified elsewhere without behavioral disturbance: Secondary | ICD-10-CM | POA: Diagnosis not present

## 2018-07-29 DIAGNOSIS — M810 Age-related osteoporosis without current pathological fracture: Secondary | ICD-10-CM | POA: Diagnosis not present

## 2018-07-29 DIAGNOSIS — I10 Essential (primary) hypertension: Secondary | ICD-10-CM | POA: Diagnosis not present

## 2018-07-29 DIAGNOSIS — Z9181 History of falling: Secondary | ICD-10-CM | POA: Diagnosis not present

## 2018-07-29 DIAGNOSIS — E559 Vitamin D deficiency, unspecified: Secondary | ICD-10-CM | POA: Diagnosis not present

## 2018-07-29 DIAGNOSIS — R2681 Unsteadiness on feet: Secondary | ICD-10-CM | POA: Diagnosis not present

## 2018-07-29 DIAGNOSIS — G309 Alzheimer's disease, unspecified: Secondary | ICD-10-CM | POA: Diagnosis not present

## 2018-07-30 DIAGNOSIS — G309 Alzheimer's disease, unspecified: Secondary | ICD-10-CM | POA: Diagnosis not present

## 2018-07-30 DIAGNOSIS — R2681 Unsteadiness on feet: Secondary | ICD-10-CM | POA: Diagnosis not present

## 2018-07-30 DIAGNOSIS — I10 Essential (primary) hypertension: Secondary | ICD-10-CM | POA: Diagnosis not present

## 2018-07-30 DIAGNOSIS — Z8781 Personal history of (healed) traumatic fracture: Secondary | ICD-10-CM | POA: Diagnosis not present

## 2018-07-30 DIAGNOSIS — E559 Vitamin D deficiency, unspecified: Secondary | ICD-10-CM | POA: Diagnosis not present

## 2018-07-30 DIAGNOSIS — F0280 Dementia in other diseases classified elsewhere without behavioral disturbance: Secondary | ICD-10-CM | POA: Diagnosis not present

## 2018-07-30 DIAGNOSIS — Z9181 History of falling: Secondary | ICD-10-CM | POA: Diagnosis not present

## 2018-07-30 DIAGNOSIS — M810 Age-related osteoporosis without current pathological fracture: Secondary | ICD-10-CM | POA: Diagnosis not present

## 2018-08-06 DIAGNOSIS — G309 Alzheimer's disease, unspecified: Secondary | ICD-10-CM | POA: Diagnosis not present

## 2018-08-06 DIAGNOSIS — F0280 Dementia in other diseases classified elsewhere without behavioral disturbance: Secondary | ICD-10-CM | POA: Diagnosis not present

## 2018-08-06 DIAGNOSIS — R2681 Unsteadiness on feet: Secondary | ICD-10-CM | POA: Diagnosis not present

## 2018-08-06 DIAGNOSIS — Z8781 Personal history of (healed) traumatic fracture: Secondary | ICD-10-CM | POA: Diagnosis not present

## 2018-08-06 DIAGNOSIS — E559 Vitamin D deficiency, unspecified: Secondary | ICD-10-CM | POA: Diagnosis not present

## 2018-08-06 DIAGNOSIS — M810 Age-related osteoporosis without current pathological fracture: Secondary | ICD-10-CM | POA: Diagnosis not present

## 2018-08-06 DIAGNOSIS — I10 Essential (primary) hypertension: Secondary | ICD-10-CM | POA: Diagnosis not present

## 2018-08-06 DIAGNOSIS — Z9181 History of falling: Secondary | ICD-10-CM | POA: Diagnosis not present

## 2018-08-09 DIAGNOSIS — I10 Essential (primary) hypertension: Secondary | ICD-10-CM | POA: Diagnosis not present

## 2018-08-09 DIAGNOSIS — E559 Vitamin D deficiency, unspecified: Secondary | ICD-10-CM | POA: Diagnosis not present

## 2018-08-09 DIAGNOSIS — R2681 Unsteadiness on feet: Secondary | ICD-10-CM | POA: Diagnosis not present

## 2018-08-09 DIAGNOSIS — Z8781 Personal history of (healed) traumatic fracture: Secondary | ICD-10-CM | POA: Diagnosis not present

## 2018-08-09 DIAGNOSIS — F0280 Dementia in other diseases classified elsewhere without behavioral disturbance: Secondary | ICD-10-CM | POA: Diagnosis not present

## 2018-08-09 DIAGNOSIS — G309 Alzheimer's disease, unspecified: Secondary | ICD-10-CM | POA: Diagnosis not present

## 2018-08-09 DIAGNOSIS — M810 Age-related osteoporosis without current pathological fracture: Secondary | ICD-10-CM | POA: Diagnosis not present

## 2018-08-09 DIAGNOSIS — Z9181 History of falling: Secondary | ICD-10-CM | POA: Diagnosis not present

## 2018-08-13 DIAGNOSIS — Z9181 History of falling: Secondary | ICD-10-CM | POA: Diagnosis not present

## 2018-08-13 DIAGNOSIS — Z8781 Personal history of (healed) traumatic fracture: Secondary | ICD-10-CM | POA: Diagnosis not present

## 2018-08-13 DIAGNOSIS — E559 Vitamin D deficiency, unspecified: Secondary | ICD-10-CM | POA: Diagnosis not present

## 2018-08-13 DIAGNOSIS — M810 Age-related osteoporosis without current pathological fracture: Secondary | ICD-10-CM | POA: Diagnosis not present

## 2018-08-13 DIAGNOSIS — R2681 Unsteadiness on feet: Secondary | ICD-10-CM | POA: Diagnosis not present

## 2018-08-13 DIAGNOSIS — I10 Essential (primary) hypertension: Secondary | ICD-10-CM | POA: Diagnosis not present

## 2018-08-13 DIAGNOSIS — F0280 Dementia in other diseases classified elsewhere without behavioral disturbance: Secondary | ICD-10-CM | POA: Diagnosis not present

## 2018-08-13 DIAGNOSIS — G309 Alzheimer's disease, unspecified: Secondary | ICD-10-CM | POA: Diagnosis not present

## 2018-08-15 DIAGNOSIS — Z8781 Personal history of (healed) traumatic fracture: Secondary | ICD-10-CM | POA: Diagnosis not present

## 2018-08-15 DIAGNOSIS — G309 Alzheimer's disease, unspecified: Secondary | ICD-10-CM | POA: Diagnosis not present

## 2018-08-15 DIAGNOSIS — F0280 Dementia in other diseases classified elsewhere without behavioral disturbance: Secondary | ICD-10-CM | POA: Diagnosis not present

## 2018-08-15 DIAGNOSIS — E559 Vitamin D deficiency, unspecified: Secondary | ICD-10-CM | POA: Diagnosis not present

## 2018-08-15 DIAGNOSIS — M810 Age-related osteoporosis without current pathological fracture: Secondary | ICD-10-CM | POA: Diagnosis not present

## 2018-08-15 DIAGNOSIS — R2681 Unsteadiness on feet: Secondary | ICD-10-CM | POA: Diagnosis not present

## 2018-08-15 DIAGNOSIS — Z9181 History of falling: Secondary | ICD-10-CM | POA: Diagnosis not present

## 2018-08-15 DIAGNOSIS — I10 Essential (primary) hypertension: Secondary | ICD-10-CM | POA: Diagnosis not present

## 2018-08-17 DIAGNOSIS — I10 Essential (primary) hypertension: Secondary | ICD-10-CM | POA: Diagnosis not present

## 2018-08-17 DIAGNOSIS — R410 Disorientation, unspecified: Secondary | ICD-10-CM | POA: Diagnosis not present

## 2018-08-17 DIAGNOSIS — E7800 Pure hypercholesterolemia, unspecified: Secondary | ICD-10-CM | POA: Diagnosis not present

## 2018-08-17 DIAGNOSIS — Z743 Need for continuous supervision: Secondary | ICD-10-CM | POA: Diagnosis not present

## 2018-08-17 DIAGNOSIS — R5381 Other malaise: Secondary | ICD-10-CM | POA: Diagnosis not present

## 2018-08-17 DIAGNOSIS — Z79899 Other long term (current) drug therapy: Secondary | ICD-10-CM | POA: Diagnosis not present

## 2018-08-17 DIAGNOSIS — R040 Epistaxis: Secondary | ICD-10-CM | POA: Diagnosis not present

## 2018-08-17 DIAGNOSIS — K219 Gastro-esophageal reflux disease without esophagitis: Secondary | ICD-10-CM | POA: Diagnosis not present

## 2018-08-17 DIAGNOSIS — R58 Hemorrhage, not elsewhere classified: Secondary | ICD-10-CM | POA: Diagnosis not present

## 2018-08-20 DIAGNOSIS — F0280 Dementia in other diseases classified elsewhere without behavioral disturbance: Secondary | ICD-10-CM | POA: Diagnosis not present

## 2018-08-20 DIAGNOSIS — E559 Vitamin D deficiency, unspecified: Secondary | ICD-10-CM | POA: Diagnosis not present

## 2018-08-20 DIAGNOSIS — Z9181 History of falling: Secondary | ICD-10-CM | POA: Diagnosis not present

## 2018-08-20 DIAGNOSIS — M810 Age-related osteoporosis without current pathological fracture: Secondary | ICD-10-CM | POA: Diagnosis not present

## 2018-08-20 DIAGNOSIS — R2681 Unsteadiness on feet: Secondary | ICD-10-CM | POA: Diagnosis not present

## 2018-08-20 DIAGNOSIS — I10 Essential (primary) hypertension: Secondary | ICD-10-CM | POA: Diagnosis not present

## 2018-08-20 DIAGNOSIS — Z8781 Personal history of (healed) traumatic fracture: Secondary | ICD-10-CM | POA: Diagnosis not present

## 2018-08-20 DIAGNOSIS — G309 Alzheimer's disease, unspecified: Secondary | ICD-10-CM | POA: Diagnosis not present

## 2018-08-21 DIAGNOSIS — R2681 Unsteadiness on feet: Secondary | ICD-10-CM | POA: Diagnosis not present

## 2018-08-21 DIAGNOSIS — G309 Alzheimer's disease, unspecified: Secondary | ICD-10-CM | POA: Diagnosis not present

## 2018-08-21 DIAGNOSIS — E559 Vitamin D deficiency, unspecified: Secondary | ICD-10-CM | POA: Diagnosis not present

## 2018-08-21 DIAGNOSIS — F0280 Dementia in other diseases classified elsewhere without behavioral disturbance: Secondary | ICD-10-CM | POA: Diagnosis not present

## 2018-08-21 DIAGNOSIS — Z8781 Personal history of (healed) traumatic fracture: Secondary | ICD-10-CM | POA: Diagnosis not present

## 2018-08-21 DIAGNOSIS — I10 Essential (primary) hypertension: Secondary | ICD-10-CM | POA: Diagnosis not present

## 2018-08-21 DIAGNOSIS — Z9181 History of falling: Secondary | ICD-10-CM | POA: Diagnosis not present

## 2018-08-21 DIAGNOSIS — M810 Age-related osteoporosis without current pathological fracture: Secondary | ICD-10-CM | POA: Diagnosis not present

## 2018-08-28 DIAGNOSIS — G309 Alzheimer's disease, unspecified: Secondary | ICD-10-CM | POA: Diagnosis not present

## 2018-08-28 DIAGNOSIS — M810 Age-related osteoporosis without current pathological fracture: Secondary | ICD-10-CM | POA: Diagnosis not present

## 2018-08-28 DIAGNOSIS — Z8781 Personal history of (healed) traumatic fracture: Secondary | ICD-10-CM | POA: Diagnosis not present

## 2018-08-28 DIAGNOSIS — Z9181 History of falling: Secondary | ICD-10-CM | POA: Diagnosis not present

## 2018-08-28 DIAGNOSIS — R2681 Unsteadiness on feet: Secondary | ICD-10-CM | POA: Diagnosis not present

## 2018-08-28 DIAGNOSIS — I10 Essential (primary) hypertension: Secondary | ICD-10-CM | POA: Diagnosis not present

## 2018-08-28 DIAGNOSIS — F0280 Dementia in other diseases classified elsewhere without behavioral disturbance: Secondary | ICD-10-CM | POA: Diagnosis not present

## 2018-08-28 DIAGNOSIS — E559 Vitamin D deficiency, unspecified: Secondary | ICD-10-CM | POA: Diagnosis not present

## 2018-08-29 DIAGNOSIS — R2681 Unsteadiness on feet: Secondary | ICD-10-CM | POA: Diagnosis not present

## 2018-08-29 DIAGNOSIS — Z8781 Personal history of (healed) traumatic fracture: Secondary | ICD-10-CM | POA: Diagnosis not present

## 2018-08-29 DIAGNOSIS — F0280 Dementia in other diseases classified elsewhere without behavioral disturbance: Secondary | ICD-10-CM | POA: Diagnosis not present

## 2018-08-29 DIAGNOSIS — G309 Alzheimer's disease, unspecified: Secondary | ICD-10-CM | POA: Diagnosis not present

## 2018-08-29 DIAGNOSIS — Z9181 History of falling: Secondary | ICD-10-CM | POA: Diagnosis not present

## 2018-08-29 DIAGNOSIS — E559 Vitamin D deficiency, unspecified: Secondary | ICD-10-CM | POA: Diagnosis not present

## 2018-08-29 DIAGNOSIS — M810 Age-related osteoporosis without current pathological fracture: Secondary | ICD-10-CM | POA: Diagnosis not present

## 2018-08-29 DIAGNOSIS — I10 Essential (primary) hypertension: Secondary | ICD-10-CM | POA: Diagnosis not present

## 2018-09-01 DIAGNOSIS — Z8781 Personal history of (healed) traumatic fracture: Secondary | ICD-10-CM | POA: Diagnosis not present

## 2018-09-01 DIAGNOSIS — R2681 Unsteadiness on feet: Secondary | ICD-10-CM | POA: Diagnosis not present

## 2018-09-01 DIAGNOSIS — G309 Alzheimer's disease, unspecified: Secondary | ICD-10-CM | POA: Diagnosis not present

## 2018-09-01 DIAGNOSIS — F0280 Dementia in other diseases classified elsewhere without behavioral disturbance: Secondary | ICD-10-CM | POA: Diagnosis not present

## 2018-09-01 DIAGNOSIS — Z9181 History of falling: Secondary | ICD-10-CM | POA: Diagnosis not present

## 2018-09-01 DIAGNOSIS — I10 Essential (primary) hypertension: Secondary | ICD-10-CM | POA: Diagnosis not present

## 2018-09-01 DIAGNOSIS — M810 Age-related osteoporosis without current pathological fracture: Secondary | ICD-10-CM | POA: Diagnosis not present

## 2018-09-01 DIAGNOSIS — E559 Vitamin D deficiency, unspecified: Secondary | ICD-10-CM | POA: Diagnosis not present

## 2018-09-03 DIAGNOSIS — Z8781 Personal history of (healed) traumatic fracture: Secondary | ICD-10-CM | POA: Diagnosis not present

## 2018-09-03 DIAGNOSIS — G309 Alzheimer's disease, unspecified: Secondary | ICD-10-CM | POA: Diagnosis not present

## 2018-09-03 DIAGNOSIS — M810 Age-related osteoporosis without current pathological fracture: Secondary | ICD-10-CM | POA: Diagnosis not present

## 2018-09-03 DIAGNOSIS — I10 Essential (primary) hypertension: Secondary | ICD-10-CM | POA: Diagnosis not present

## 2018-09-03 DIAGNOSIS — E559 Vitamin D deficiency, unspecified: Secondary | ICD-10-CM | POA: Diagnosis not present

## 2018-09-03 DIAGNOSIS — F0280 Dementia in other diseases classified elsewhere without behavioral disturbance: Secondary | ICD-10-CM | POA: Diagnosis not present

## 2018-09-03 DIAGNOSIS — Z9181 History of falling: Secondary | ICD-10-CM | POA: Diagnosis not present

## 2018-09-03 DIAGNOSIS — R2681 Unsteadiness on feet: Secondary | ICD-10-CM | POA: Diagnosis not present

## 2018-09-04 DIAGNOSIS — M2041 Other hammer toe(s) (acquired), right foot: Secondary | ICD-10-CM | POA: Diagnosis not present

## 2018-09-04 DIAGNOSIS — B351 Tinea unguium: Secondary | ICD-10-CM | POA: Diagnosis not present

## 2018-09-05 ENCOUNTER — Encounter: Payer: Self-pay | Admitting: Family Medicine

## 2018-09-05 ENCOUNTER — Ambulatory Visit (INDEPENDENT_AMBULATORY_CARE_PROVIDER_SITE_OTHER): Payer: Medicare HMO | Admitting: Family Medicine

## 2018-09-05 VITALS — BP 136/81 | HR 67 | Temp 96.5°F

## 2018-09-05 DIAGNOSIS — M25512 Pain in left shoulder: Secondary | ICD-10-CM

## 2018-09-05 NOTE — Progress Notes (Signed)
BP 136/81   Pulse 67   Temp (!) 96.5 F (35.8 C) (Oral)    Subjective:    Patient ID: Vickie SnowballHelen M Barbian, female    DOB: September 06, 1928, 84 y.o.   MRN: [id number]  HPI: Vickie Hall is a 84 y.o. female presenting on 09/05/2018 for Arm Pain (Left. Started yesterday- ROM is not like it nornaly is)   HPI Left shoulder pain Patient comes in brought in by staff from the nursing home with complaints of left shoulder pain And decreased range of motion with that left shoulder.  Patient has dementia and osteoporosis.  They said they just noticed the shoulder pain starting yesterday and into today and that she was really not wanting to move that shoulder like she did previously.  Patient is unable to remember if she had any trauma or fall as she has pretty significant dementia.  The pain is mostly in the anterior and lateral aspect of the shoulder and she does have some bony tenderness  Relevant past medical, surgical, family and social history reviewed and updated as indicated. Interim medical history since our last visit reviewed. Allergies and medications reviewed and updated.  Review of Systems  Constitutional: Negative for chills and fever.  Eyes: Negative for visual disturbance.  Cardiovascular: Negative for chest pain and leg swelling.  Musculoskeletal: Positive for arthralgias. Negative for back pain, gait problem and myalgias.  Skin: Negative for rash.  Neurological: Negative for light-headedness and headaches.  Psychiatric/Behavioral: Negative for agitation and behavioral problems.  All other systems reviewed and are negative.   Per HPI unless specifically indicated above   Allergies as of 09/05/2018      Reactions   Actonel [risedronate Sodium] Other (See Comments)   Lipitor [atorvastatin] Other (See Comments)      Medication List       Accurate as of September 05, 2018  9:06 AM. Always use your most recent med list.        acetaminophen 325 MG tablet Commonly known as:   TYLENOL Take 2 tablets (650 mg total) by mouth every 6 (six) hours as needed for mild pain (or Fever >/= 101).   apixaban 5 MG Tabs tablet Commonly known as:  ELIQUIS Take 1 tablet (5 mg total) by mouth 2 (two) times daily.   diphenhydrAMINE 25 MG tablet Commonly known as:  BENADRYL Take 25 mg by mouth every 4 (four) hours as needed for itching, allergies or sleep.   ezetimibe 10 MG tablet Commonly known as:  ZETIA Take 10 mg by mouth daily.   ferrous sulfate 325 (65 FE) MG tablet Take 1 tablet (325 mg total) by mouth daily with breakfast.   senna-docusate 8.6-50 MG tablet Commonly known as:  Senokot-S Take 2 tablets by mouth 2 (two) times daily.   valsartan 160 MG tablet Commonly known as:  DIOVAN Take 160 mg by mouth daily.   Vitamin D (Ergocalciferol) 1.25 MG (50000 UT) Caps capsule Commonly known as:  DRISDOL Take 1 capsule (50,000 Units total) by mouth every 7 (seven) days.          Objective:    BP 136/81   Pulse 67   Temp (!) 96.5 F (35.8 C) (Oral)   Wt Readings from Last 3 Encounters:  06/23/18 154 lb 1.6 oz (69.9 kg)  06/06/18 154 lb (69.9 kg)  05/07/18 154 lb (69.9 kg)    Physical Exam Vitals signs and nursing note reviewed.  Constitutional:      General: She is  not in acute distress.    Appearance: She is well-developed. She is not diaphoretic.  Eyes:     Conjunctiva/sclera: Conjunctivae normal.  Musculoskeletal:     Left shoulder: She exhibits decreased range of motion, tenderness and bony tenderness. She exhibits no swelling, no effusion, no deformity, normal pulse and normal strength.  Skin:    General: Skin is warm and dry.     Findings: No rash.  Neurological:     Mental Status: She is alert and oriented to person, place, and time.     Coordination: Coordination normal.  Psychiatric:        Behavior: Behavior normal.         Assessment & Plan:   Problem List Items Addressed This Visit    None    Visit Diagnoses    Acute pain of  left shoulder    -  Primary   Relevant Orders   DG Shoulder Left      Our x-ray is down so [facility] Follow up plan: Return if symptoms worsen or fail to improve.  Counseling provided for all of the vaccine components Orders Placed This Encounter  Procedures  . DG Shoulder Left    Arville CareJoshua Dettinger, MD [facility] 09/05/2018, 9:06 AM

## 2018-09-08 DIAGNOSIS — I10 Essential (primary) hypertension: Secondary | ICD-10-CM | POA: Diagnosis not present

## 2018-09-08 DIAGNOSIS — F0280 Dementia in other diseases classified elsewhere without behavioral disturbance: Secondary | ICD-10-CM | POA: Diagnosis not present

## 2018-09-08 DIAGNOSIS — Z8781 Personal history of (healed) traumatic fracture: Secondary | ICD-10-CM | POA: Diagnosis not present

## 2018-09-08 DIAGNOSIS — G309 Alzheimer's disease, unspecified: Secondary | ICD-10-CM | POA: Diagnosis not present

## 2018-09-08 DIAGNOSIS — R2681 Unsteadiness on feet: Secondary | ICD-10-CM | POA: Diagnosis not present

## 2018-09-08 DIAGNOSIS — M810 Age-related osteoporosis without current pathological fracture: Secondary | ICD-10-CM | POA: Diagnosis not present

## 2018-09-08 DIAGNOSIS — Z9181 History of falling: Secondary | ICD-10-CM | POA: Diagnosis not present

## 2018-09-08 DIAGNOSIS — E559 Vitamin D deficiency, unspecified: Secondary | ICD-10-CM | POA: Diagnosis not present

## 2018-09-11 DIAGNOSIS — G309 Alzheimer's disease, unspecified: Secondary | ICD-10-CM | POA: Diagnosis not present

## 2018-09-11 DIAGNOSIS — M810 Age-related osteoporosis without current pathological fracture: Secondary | ICD-10-CM | POA: Diagnosis not present

## 2018-09-11 DIAGNOSIS — R2681 Unsteadiness on feet: Secondary | ICD-10-CM | POA: Diagnosis not present

## 2018-09-11 DIAGNOSIS — Z8781 Personal history of (healed) traumatic fracture: Secondary | ICD-10-CM | POA: Diagnosis not present

## 2018-09-11 DIAGNOSIS — I10 Essential (primary) hypertension: Secondary | ICD-10-CM | POA: Diagnosis not present

## 2018-09-11 DIAGNOSIS — E559 Vitamin D deficiency, unspecified: Secondary | ICD-10-CM | POA: Diagnosis not present

## 2018-09-11 DIAGNOSIS — F0280 Dementia in other diseases classified elsewhere without behavioral disturbance: Secondary | ICD-10-CM | POA: Diagnosis not present

## 2018-09-11 DIAGNOSIS — Z9181 History of falling: Secondary | ICD-10-CM | POA: Diagnosis not present

## 2018-09-15 DIAGNOSIS — M6281 Muscle weakness (generalized): Secondary | ICD-10-CM | POA: Diagnosis not present

## 2018-09-18 DIAGNOSIS — R296 Repeated falls: Secondary | ICD-10-CM | POA: Diagnosis not present

## 2018-09-18 DIAGNOSIS — M6281 Muscle weakness (generalized): Secondary | ICD-10-CM | POA: Diagnosis not present

## 2018-09-18 DIAGNOSIS — F0150 Vascular dementia without behavioral disturbance: Secondary | ICD-10-CM | POA: Diagnosis not present

## 2018-09-22 DIAGNOSIS — M6281 Muscle weakness (generalized): Secondary | ICD-10-CM | POA: Diagnosis not present

## 2018-09-23 DIAGNOSIS — F0150 Vascular dementia without behavioral disturbance: Secondary | ICD-10-CM | POA: Diagnosis not present

## 2018-09-23 DIAGNOSIS — R296 Repeated falls: Secondary | ICD-10-CM | POA: Diagnosis not present

## 2018-09-25 ENCOUNTER — Ambulatory Visit: Payer: Medicare HMO | Admitting: Family Medicine

## 2018-09-25 DIAGNOSIS — R296 Repeated falls: Secondary | ICD-10-CM | POA: Diagnosis not present

## 2018-09-25 DIAGNOSIS — F0150 Vascular dementia without behavioral disturbance: Secondary | ICD-10-CM | POA: Diagnosis not present

## 2018-09-25 DIAGNOSIS — M6281 Muscle weakness (generalized): Secondary | ICD-10-CM | POA: Diagnosis not present

## 2018-09-29 DIAGNOSIS — M6281 Muscle weakness (generalized): Secondary | ICD-10-CM | POA: Diagnosis not present

## 2018-09-30 DIAGNOSIS — F0150 Vascular dementia without behavioral disturbance: Secondary | ICD-10-CM | POA: Diagnosis not present

## 2018-09-30 DIAGNOSIS — R296 Repeated falls: Secondary | ICD-10-CM | POA: Diagnosis not present

## 2018-10-02 DIAGNOSIS — R296 Repeated falls: Secondary | ICD-10-CM | POA: Diagnosis not present

## 2018-10-02 DIAGNOSIS — M6281 Muscle weakness (generalized): Secondary | ICD-10-CM | POA: Diagnosis not present

## 2018-10-02 DIAGNOSIS — D638 Anemia in other chronic diseases classified elsewhere: Secondary | ICD-10-CM | POA: Diagnosis not present

## 2018-10-02 DIAGNOSIS — R040 Epistaxis: Secondary | ICD-10-CM | POA: Diagnosis not present

## 2018-10-02 DIAGNOSIS — E785 Hyperlipidemia, unspecified: Secondary | ICD-10-CM | POA: Diagnosis not present

## 2018-10-02 DIAGNOSIS — F0150 Vascular dementia without behavioral disturbance: Secondary | ICD-10-CM | POA: Diagnosis not present

## 2018-10-02 DIAGNOSIS — I10 Essential (primary) hypertension: Secondary | ICD-10-CM | POA: Diagnosis not present

## 2018-10-06 DIAGNOSIS — M6281 Muscle weakness (generalized): Secondary | ICD-10-CM | POA: Diagnosis not present

## 2018-10-07 DIAGNOSIS — F0150 Vascular dementia without behavioral disturbance: Secondary | ICD-10-CM | POA: Diagnosis not present

## 2018-10-07 DIAGNOSIS — R296 Repeated falls: Secondary | ICD-10-CM | POA: Diagnosis not present

## 2018-10-09 DIAGNOSIS — R296 Repeated falls: Secondary | ICD-10-CM | POA: Diagnosis not present

## 2018-10-09 DIAGNOSIS — F0150 Vascular dementia without behavioral disturbance: Secondary | ICD-10-CM | POA: Diagnosis not present

## 2018-10-13 DIAGNOSIS — M6281 Muscle weakness (generalized): Secondary | ICD-10-CM | POA: Diagnosis not present

## 2018-10-14 DIAGNOSIS — F0150 Vascular dementia without behavioral disturbance: Secondary | ICD-10-CM | POA: Diagnosis not present

## 2018-10-14 DIAGNOSIS — R296 Repeated falls: Secondary | ICD-10-CM | POA: Diagnosis not present

## 2018-10-16 DIAGNOSIS — M6281 Muscle weakness (generalized): Secondary | ICD-10-CM | POA: Diagnosis not present

## 2018-10-16 DIAGNOSIS — F0150 Vascular dementia without behavioral disturbance: Secondary | ICD-10-CM | POA: Diagnosis not present

## 2018-10-16 DIAGNOSIS — R296 Repeated falls: Secondary | ICD-10-CM | POA: Diagnosis not present

## 2018-10-20 DIAGNOSIS — M6281 Muscle weakness (generalized): Secondary | ICD-10-CM | POA: Diagnosis not present

## 2018-10-21 DIAGNOSIS — R296 Repeated falls: Secondary | ICD-10-CM | POA: Diagnosis not present

## 2018-10-21 DIAGNOSIS — F0150 Vascular dementia without behavioral disturbance: Secondary | ICD-10-CM | POA: Diagnosis not present

## 2018-10-23 DIAGNOSIS — R296 Repeated falls: Secondary | ICD-10-CM | POA: Diagnosis not present

## 2018-10-23 DIAGNOSIS — F0150 Vascular dementia without behavioral disturbance: Secondary | ICD-10-CM | POA: Diagnosis not present

## 2018-10-23 DIAGNOSIS — M6281 Muscle weakness (generalized): Secondary | ICD-10-CM | POA: Diagnosis not present

## 2018-10-27 DIAGNOSIS — M6281 Muscle weakness (generalized): Secondary | ICD-10-CM | POA: Diagnosis not present

## 2018-10-28 DIAGNOSIS — M6281 Muscle weakness (generalized): Secondary | ICD-10-CM | POA: Diagnosis not present

## 2018-10-28 DIAGNOSIS — F0150 Vascular dementia without behavioral disturbance: Secondary | ICD-10-CM | POA: Diagnosis not present

## 2018-10-28 DIAGNOSIS — R296 Repeated falls: Secondary | ICD-10-CM | POA: Diagnosis not present

## 2018-10-30 DIAGNOSIS — E785 Hyperlipidemia, unspecified: Secondary | ICD-10-CM | POA: Diagnosis not present

## 2018-10-30 DIAGNOSIS — R296 Repeated falls: Secondary | ICD-10-CM | POA: Diagnosis not present

## 2018-10-30 DIAGNOSIS — I10 Essential (primary) hypertension: Secondary | ICD-10-CM | POA: Diagnosis not present

## 2018-10-30 DIAGNOSIS — F0390 Unspecified dementia without behavioral disturbance: Secondary | ICD-10-CM | POA: Diagnosis not present

## 2018-10-30 DIAGNOSIS — F0150 Vascular dementia without behavioral disturbance: Secondary | ICD-10-CM | POA: Diagnosis not present

## 2018-10-30 DIAGNOSIS — D638 Anemia in other chronic diseases classified elsewhere: Secondary | ICD-10-CM | POA: Diagnosis not present

## 2018-11-04 DIAGNOSIS — R296 Repeated falls: Secondary | ICD-10-CM | POA: Diagnosis not present

## 2018-11-04 DIAGNOSIS — F0150 Vascular dementia without behavioral disturbance: Secondary | ICD-10-CM | POA: Diagnosis not present

## 2018-11-06 DIAGNOSIS — F0150 Vascular dementia without behavioral disturbance: Secondary | ICD-10-CM | POA: Diagnosis not present

## 2018-11-06 DIAGNOSIS — R296 Repeated falls: Secondary | ICD-10-CM | POA: Diagnosis not present

## 2018-11-08 DIAGNOSIS — M6281 Muscle weakness (generalized): Secondary | ICD-10-CM | POA: Diagnosis not present

## 2018-11-10 IMAGING — CR DG WRIST COMPLETE 3+V*L*
4 series · 4 of 4 positions shown · non-contrast
Comparison: Same day

CLINICAL DATA: Status post fall, interval cast placement

EXAM:
LEFT WRIST - COMPLETE 3+ VIEW

[wrist pa]
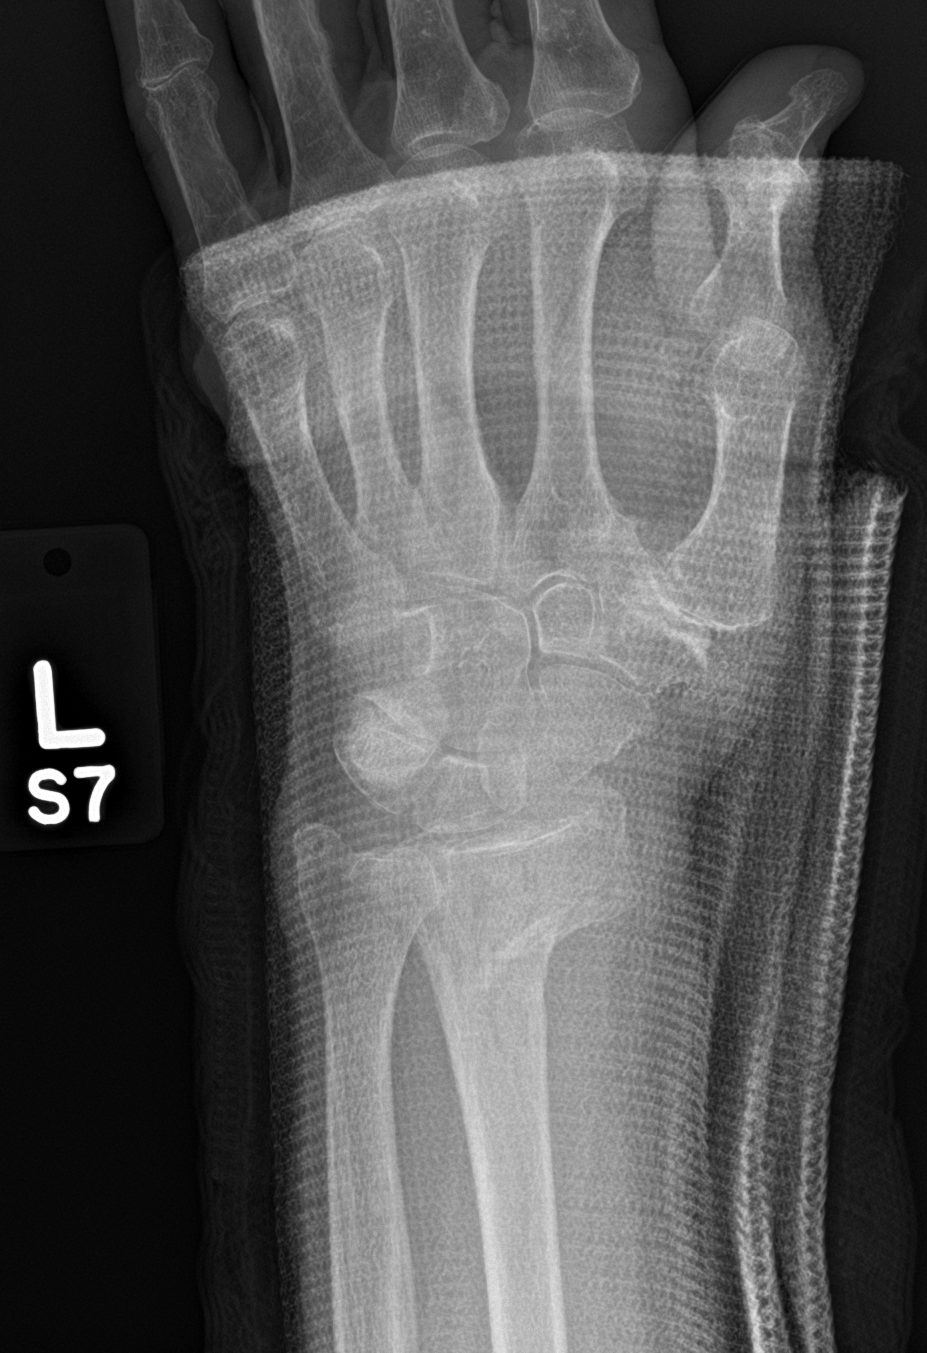

[wrist obl]
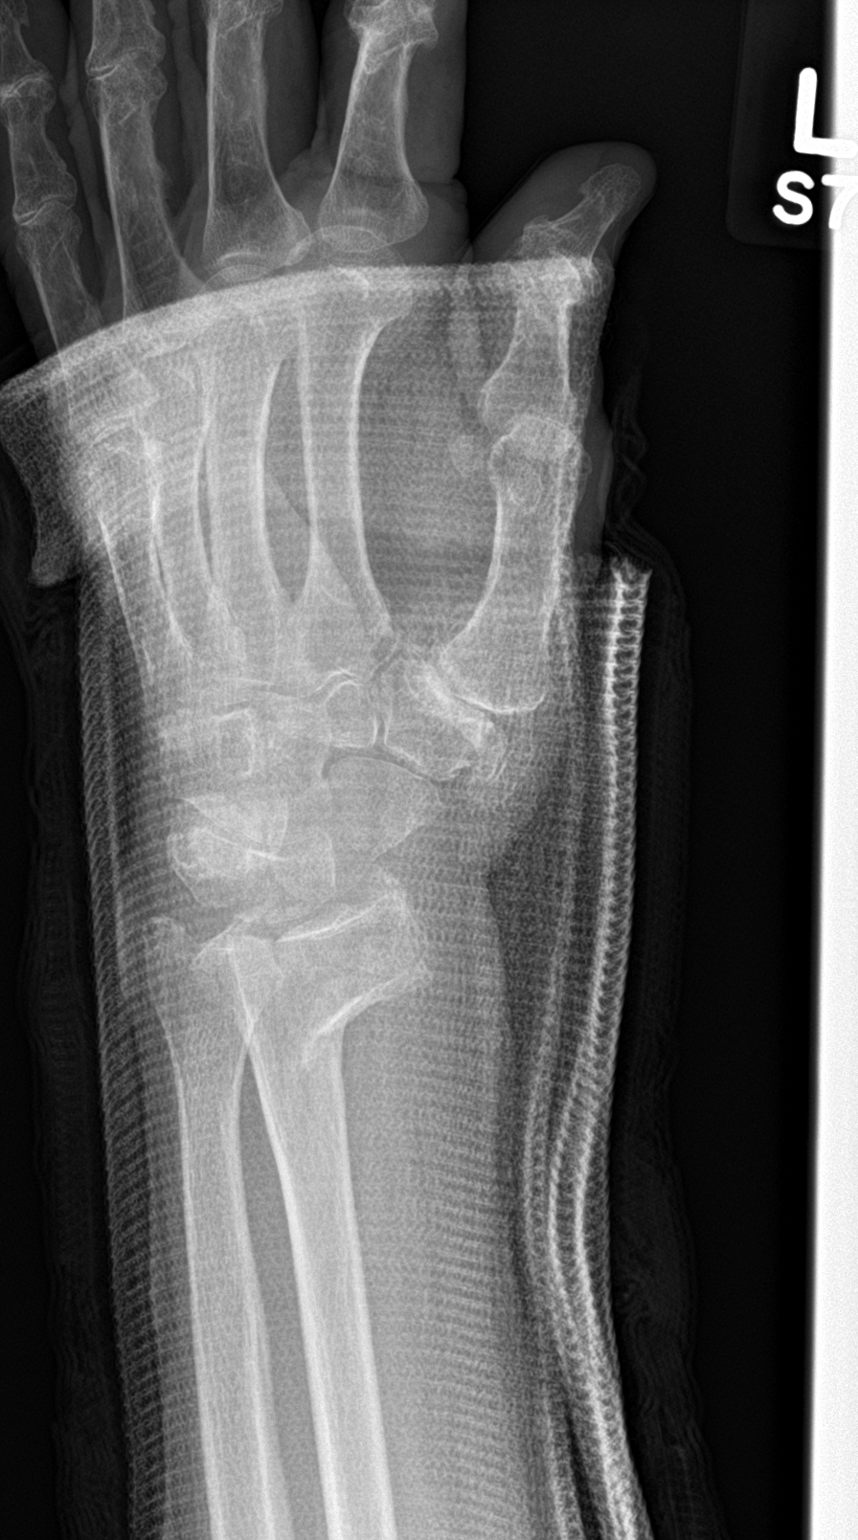

[wrist navicular]
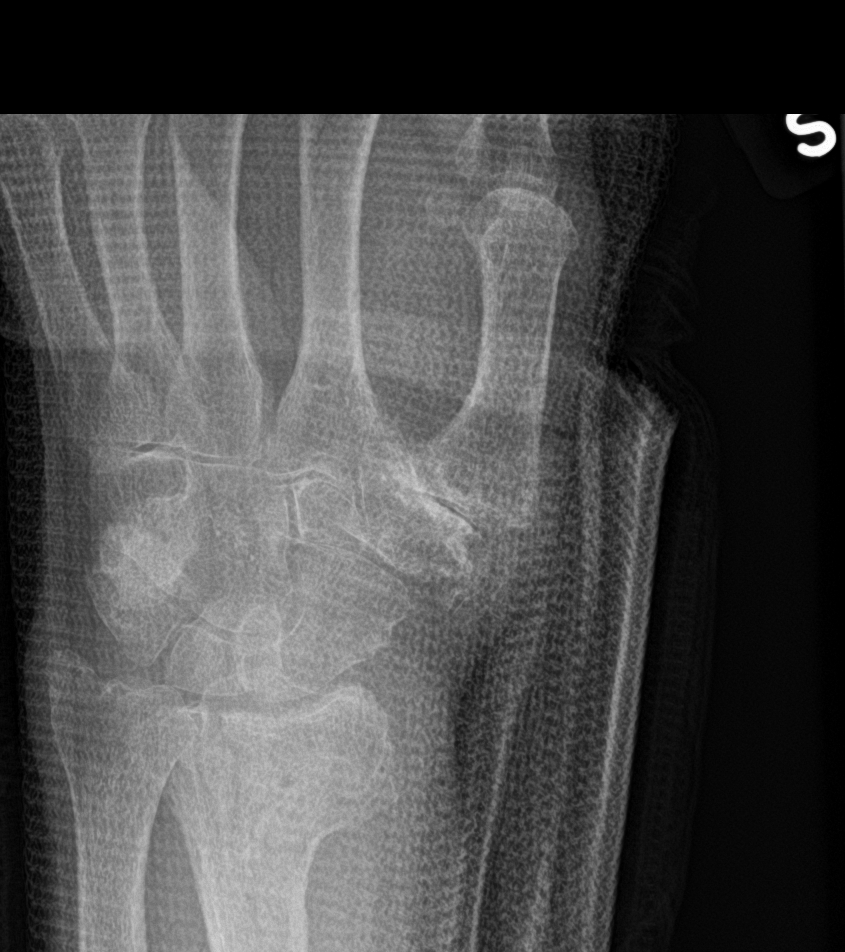

[wrist lat]
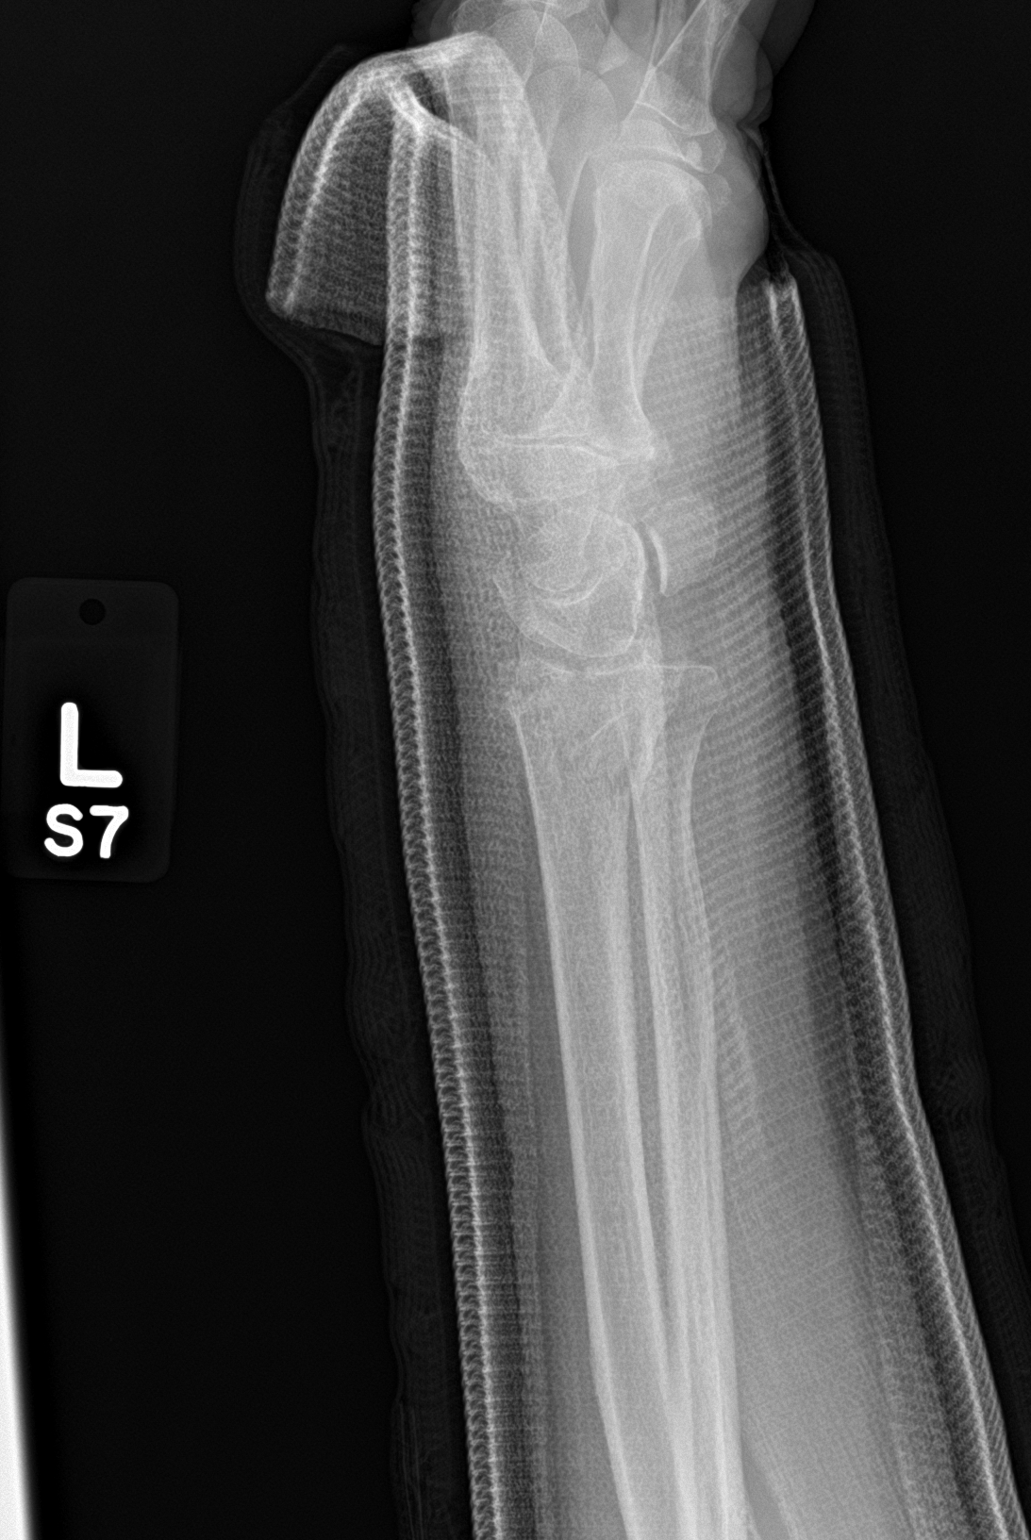

[4 of 4 positions shown; findings below may reference images not displayed]

FINDINGS: Comminuted distal radial metaphysis fracture with a fracture cleft
extending to the radiocarpal joint surface. No significant change in
the overall alignment.

Nondisplaced ulnar styloid process fracture.

Severe osteoarthritis of the first CMC joint. Mild osteoarthritis of
the scaphotrapeziotrapezoid joint.
IMPRESSION: Comminuted distal radial metaphysis fracture with a fracture cleft
extending to the radiocarpal joint surface. No significant change in
the overall alignment.

Nondisplaced ulnar styloid process fracture.

## 2018-11-11 DIAGNOSIS — R296 Repeated falls: Secondary | ICD-10-CM | POA: Diagnosis not present

## 2018-11-11 DIAGNOSIS — M6281 Muscle weakness (generalized): Secondary | ICD-10-CM | POA: Diagnosis not present

## 2018-11-11 DIAGNOSIS — F0150 Vascular dementia without behavioral disturbance: Secondary | ICD-10-CM | POA: Diagnosis not present

## 2018-11-11 IMAGING — CR DG PORTABLE PELVIS
1 series · 1 of 1 positions shown · non-contrast
Comparison: the previous day's study

CLINICAL DATA: Post internal fixation

EXAM:
PORTABLE PELVIS 1 VIEW

[AP]
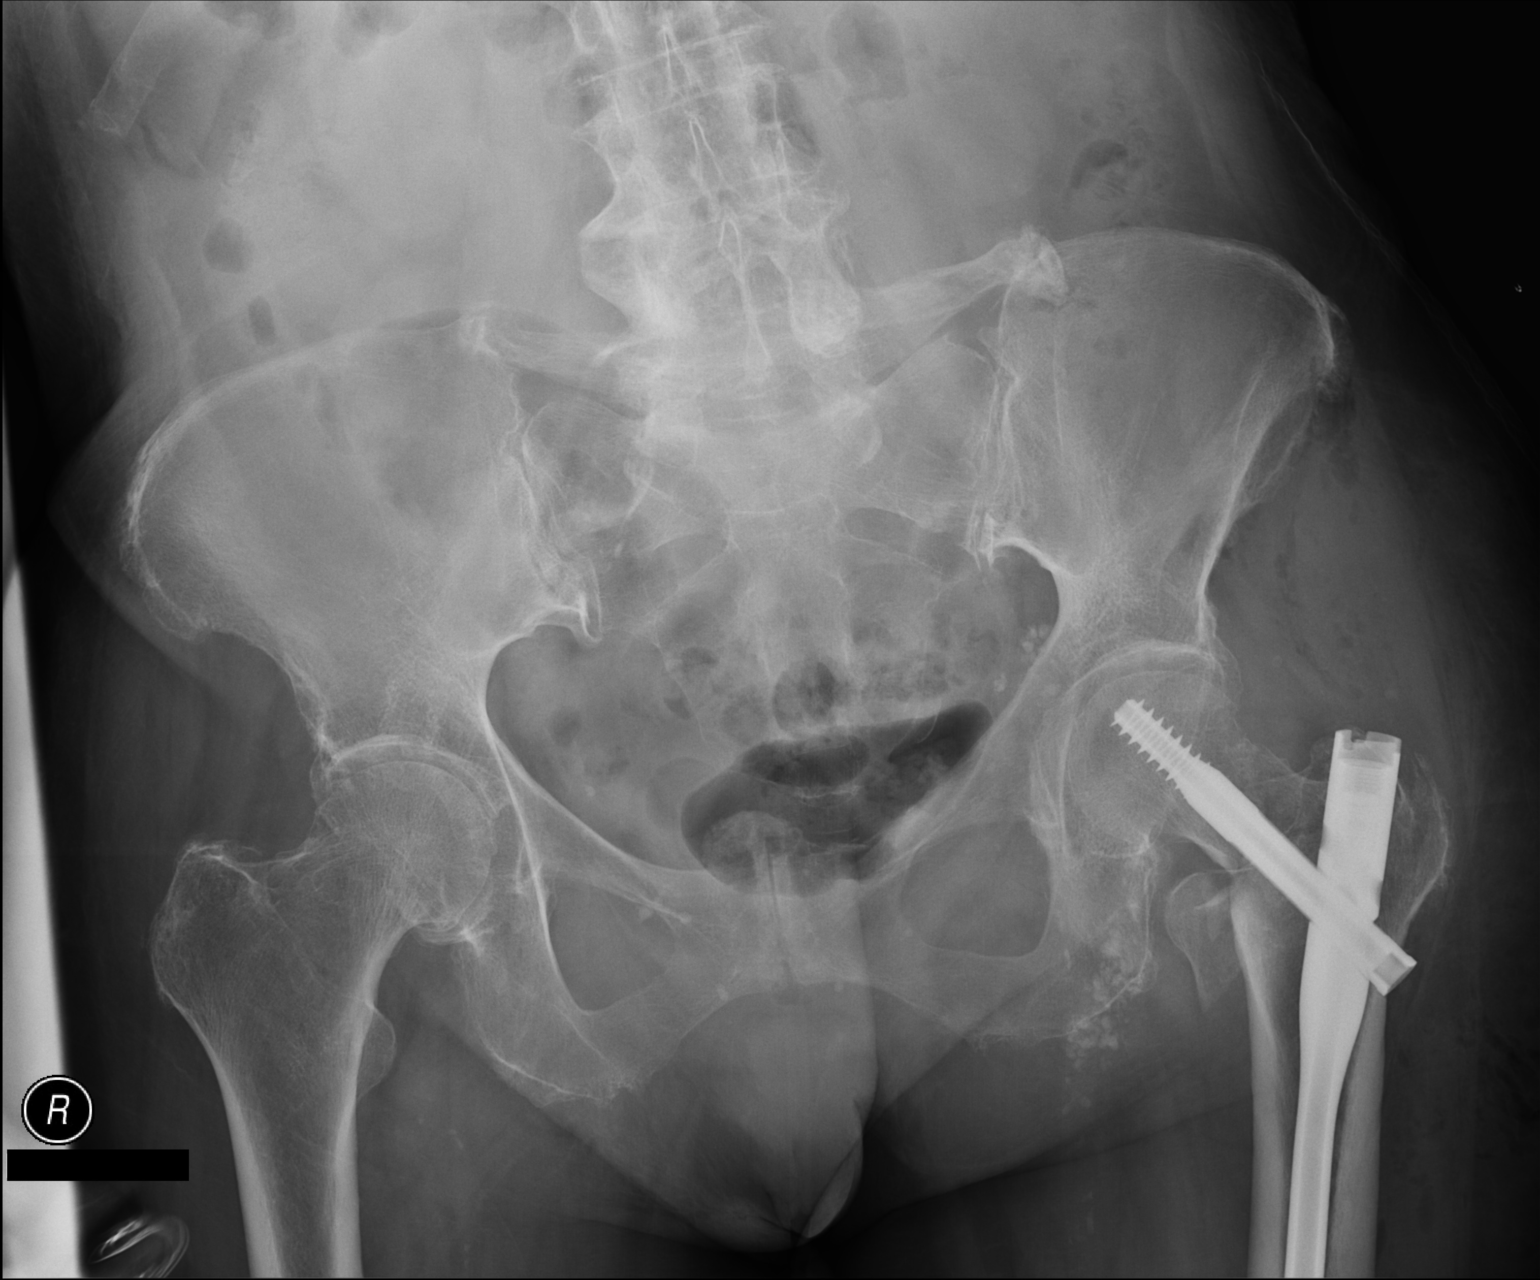

[1 of 1 positions shown; findings below may reference images not displayed]

FINDINGS: Interval placement of IM rod and sliding screw across a comminuted
left intertrochanteric fracture, major fracture fragments in near
anatomic alignment. There is persistent medial displacement of the
inferior trochanter fracture fragment. No new fracture or
dislocation. Distal aspect of IM rod not visualized.

Stable changes of right hip osteoarthritis.
IMPRESSION: 1. Internal fixation of left intertrochanteric femur fracture
without apparent complication.

## 2018-11-17 IMAGING — CR DG CHEST 1V PORT
1 series · 1 of 1 positions shown · non-contrast
Comparison: October 28, 2016

CLINICAL DATA: Hypoxia

EXAM:
PORTABLE CHEST 1 VIEW

[AP]
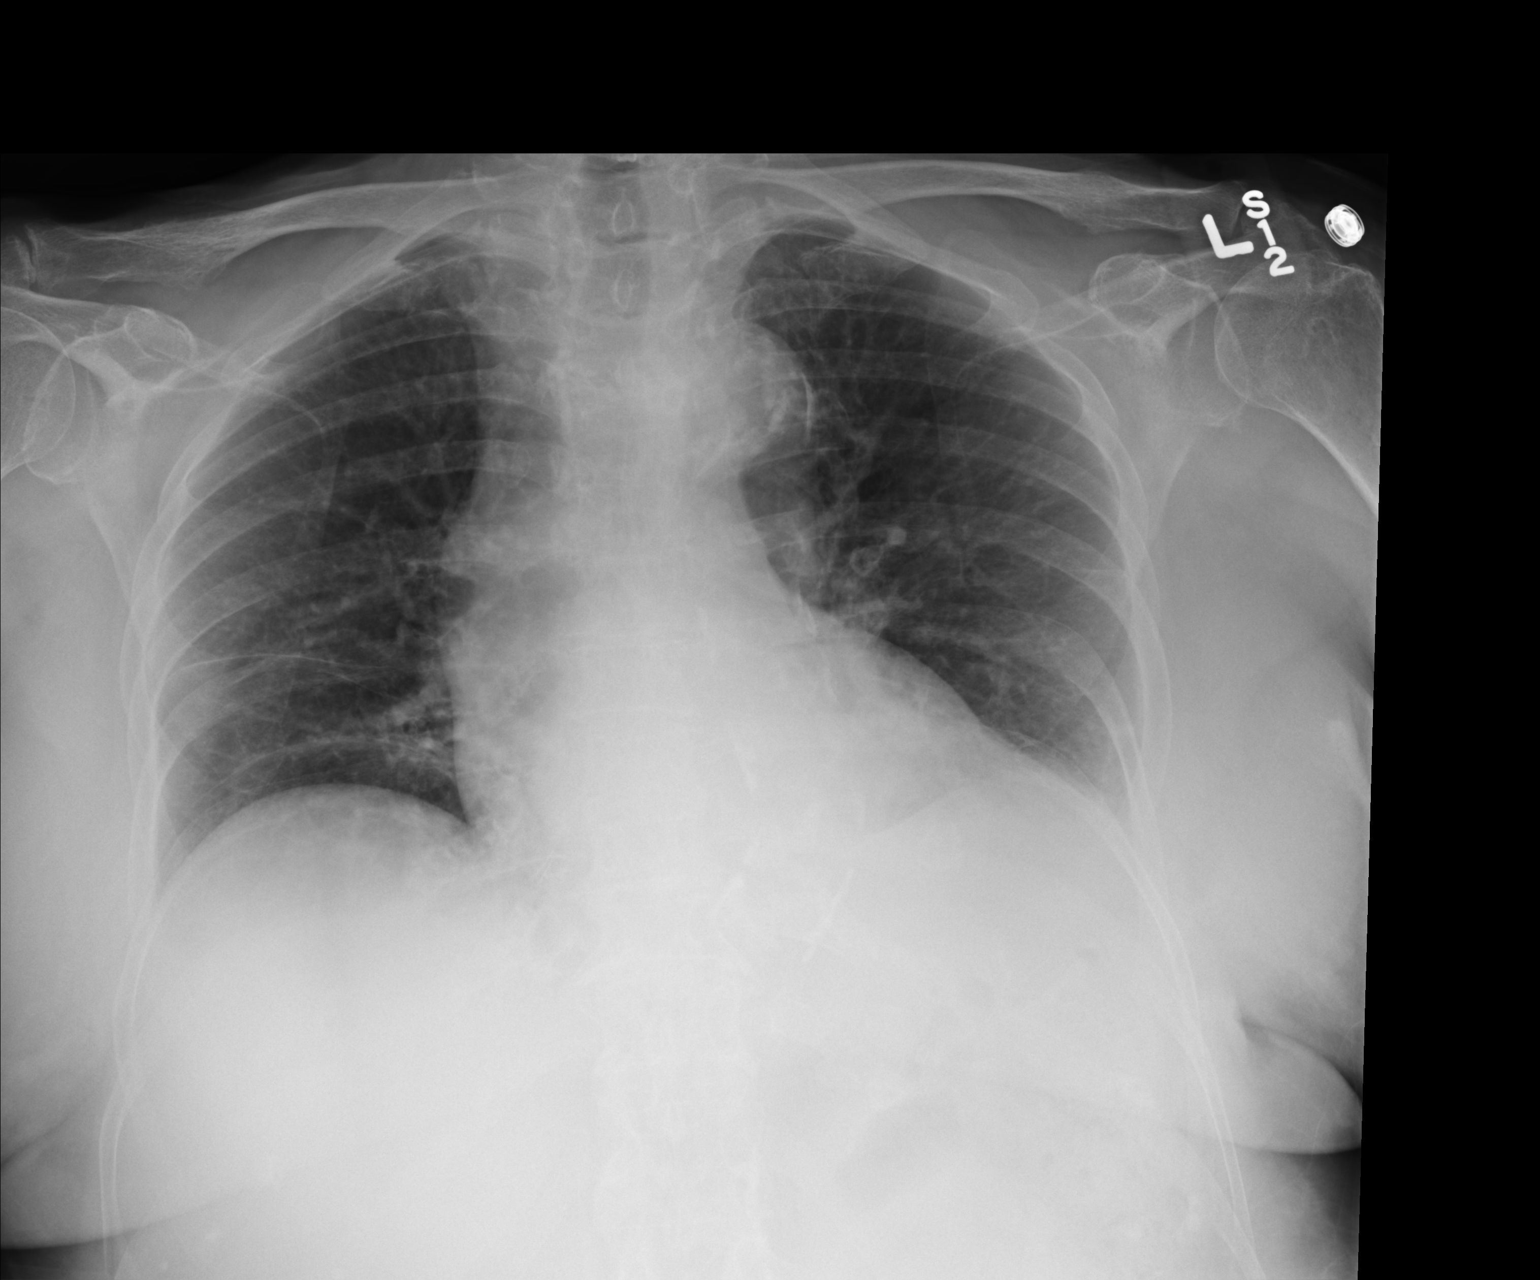

[1 of 1 positions shown; findings below may reference images not displayed]

FINDINGS: There is no edema or consolidation. Heart size and pulmonary
vascularity are normal. No adenopathy. There is aortic
atherosclerosis. No bone lesions.
IMPRESSION: Aortic atherosclerosis.  No edema or consolidation.

## 2018-11-27 DIAGNOSIS — I10 Essential (primary) hypertension: Secondary | ICD-10-CM | POA: Diagnosis not present

## 2018-11-27 DIAGNOSIS — D638 Anemia in other chronic diseases classified elsewhere: Secondary | ICD-10-CM | POA: Diagnosis not present

## 2018-11-27 DIAGNOSIS — F0390 Unspecified dementia without behavioral disturbance: Secondary | ICD-10-CM | POA: Diagnosis not present

## 2018-11-27 DIAGNOSIS — N3000 Acute cystitis without hematuria: Secondary | ICD-10-CM | POA: Diagnosis not present

## 2018-12-02 DIAGNOSIS — N390 Urinary tract infection, site not specified: Secondary | ICD-10-CM | POA: Diagnosis not present

## 2018-12-05 DIAGNOSIS — B351 Tinea unguium: Secondary | ICD-10-CM | POA: Diagnosis not present

## 2018-12-08 DIAGNOSIS — N390 Urinary tract infection, site not specified: Secondary | ICD-10-CM | POA: Diagnosis not present

## 2018-12-25 DIAGNOSIS — D638 Anemia in other chronic diseases classified elsewhere: Secondary | ICD-10-CM | POA: Diagnosis not present

## 2018-12-25 DIAGNOSIS — I10 Essential (primary) hypertension: Secondary | ICD-10-CM | POA: Diagnosis not present

## 2018-12-25 DIAGNOSIS — E785 Hyperlipidemia, unspecified: Secondary | ICD-10-CM | POA: Diagnosis not present

## 2018-12-25 DIAGNOSIS — N390 Urinary tract infection, site not specified: Secondary | ICD-10-CM | POA: Diagnosis not present

## 2018-12-25 DIAGNOSIS — F0390 Unspecified dementia without behavioral disturbance: Secondary | ICD-10-CM | POA: Diagnosis not present

## 2019-01-01 DIAGNOSIS — D518 Other vitamin B12 deficiency anemias: Secondary | ICD-10-CM | POA: Diagnosis not present

## 2019-01-01 DIAGNOSIS — Z79899 Other long term (current) drug therapy: Secondary | ICD-10-CM | POA: Diagnosis not present

## 2019-01-01 DIAGNOSIS — E119 Type 2 diabetes mellitus without complications: Secondary | ICD-10-CM | POA: Diagnosis not present

## 2019-01-01 DIAGNOSIS — E038 Other specified hypothyroidism: Secondary | ICD-10-CM | POA: Diagnosis not present

## 2019-01-01 DIAGNOSIS — E7849 Other hyperlipidemia: Secondary | ICD-10-CM | POA: Diagnosis not present

## 2019-01-01 DIAGNOSIS — E559 Vitamin D deficiency, unspecified: Secondary | ICD-10-CM | POA: Diagnosis not present

## 2019-01-09 DIAGNOSIS — N390 Urinary tract infection, site not specified: Secondary | ICD-10-CM | POA: Diagnosis not present

## 2019-01-22 DIAGNOSIS — F0390 Unspecified dementia without behavioral disturbance: Secondary | ICD-10-CM | POA: Diagnosis not present

## 2019-01-22 DIAGNOSIS — I10 Essential (primary) hypertension: Secondary | ICD-10-CM | POA: Diagnosis not present

## 2019-01-22 DIAGNOSIS — E870 Hyperosmolality and hypernatremia: Secondary | ICD-10-CM | POA: Diagnosis not present

## 2019-01-22 DIAGNOSIS — D638 Anemia in other chronic diseases classified elsewhere: Secondary | ICD-10-CM | POA: Diagnosis not present

## 2019-01-23 DIAGNOSIS — F0391 Unspecified dementia with behavioral disturbance: Secondary | ICD-10-CM | POA: Diagnosis not present

## 2019-01-23 DIAGNOSIS — R454 Irritability and anger: Secondary | ICD-10-CM | POA: Diagnosis not present

## 2019-01-23 DIAGNOSIS — F419 Anxiety disorder, unspecified: Secondary | ICD-10-CM | POA: Diagnosis not present

## 2019-01-28 DIAGNOSIS — I10 Essential (primary) hypertension: Secondary | ICD-10-CM | POA: Diagnosis not present

## 2019-01-28 DIAGNOSIS — E782 Mixed hyperlipidemia: Secondary | ICD-10-CM | POA: Diagnosis not present

## 2019-01-28 DIAGNOSIS — F0280 Dementia in other diseases classified elsewhere without behavioral disturbance: Secondary | ICD-10-CM | POA: Diagnosis not present

## 2019-01-28 DIAGNOSIS — E119 Type 2 diabetes mellitus without complications: Secondary | ICD-10-CM | POA: Diagnosis not present

## 2019-01-29 DIAGNOSIS — R51 Headache: Secondary | ICD-10-CM | POA: Diagnosis not present

## 2019-01-29 DIAGNOSIS — M545 Low back pain: Secondary | ICD-10-CM | POA: Diagnosis not present

## 2019-02-02 DIAGNOSIS — F419 Anxiety disorder, unspecified: Secondary | ICD-10-CM | POA: Diagnosis not present

## 2019-02-18 DIAGNOSIS — M79601 Pain in right arm: Secondary | ICD-10-CM | POA: Diagnosis not present

## 2019-02-18 DIAGNOSIS — R454 Irritability and anger: Secondary | ICD-10-CM | POA: Diagnosis not present

## 2019-02-18 DIAGNOSIS — W19XXXA Unspecified fall, initial encounter: Secondary | ICD-10-CM | POA: Diagnosis not present

## 2019-02-18 DIAGNOSIS — F0391 Unspecified dementia with behavioral disturbance: Secondary | ICD-10-CM | POA: Diagnosis not present

## 2019-02-18 DIAGNOSIS — F419 Anxiety disorder, unspecified: Secondary | ICD-10-CM | POA: Diagnosis not present

## 2019-02-18 DIAGNOSIS — M79631 Pain in right forearm: Secondary | ICD-10-CM | POA: Diagnosis not present

## 2019-02-18 DIAGNOSIS — S59911A Unspecified injury of right forearm, initial encounter: Secondary | ICD-10-CM | POA: Diagnosis not present

## 2019-02-19 DIAGNOSIS — I10 Essential (primary) hypertension: Secondary | ICD-10-CM | POA: Diagnosis not present

## 2019-02-19 DIAGNOSIS — R296 Repeated falls: Secondary | ICD-10-CM | POA: Diagnosis not present

## 2019-02-19 DIAGNOSIS — F0390 Unspecified dementia without behavioral disturbance: Secondary | ICD-10-CM | POA: Diagnosis not present

## 2019-02-19 DIAGNOSIS — R269 Unspecified abnormalities of gait and mobility: Secondary | ICD-10-CM | POA: Diagnosis not present

## 2019-03-06 DIAGNOSIS — F419 Anxiety disorder, unspecified: Secondary | ICD-10-CM | POA: Diagnosis not present

## 2019-03-06 DIAGNOSIS — F330 Major depressive disorder, recurrent, mild: Secondary | ICD-10-CM | POA: Diagnosis not present

## 2019-03-09 DIAGNOSIS — E119 Type 2 diabetes mellitus without complications: Secondary | ICD-10-CM | POA: Diagnosis not present

## 2019-03-09 DIAGNOSIS — F0280 Dementia in other diseases classified elsewhere without behavioral disturbance: Secondary | ICD-10-CM | POA: Diagnosis not present

## 2019-03-09 DIAGNOSIS — I10 Essential (primary) hypertension: Secondary | ICD-10-CM | POA: Diagnosis not present

## 2019-03-09 DIAGNOSIS — E782 Mixed hyperlipidemia: Secondary | ICD-10-CM | POA: Diagnosis not present

## 2019-07-19 DEATH — deceased
